# Patient Record
Sex: Female | Born: 1935
Health system: Southern US, Community
[De-identification: ages and names within clinical notes are randomized; demographics above are authoritative.]

## PROBLEM LIST (undated history)

## (undated) DIAGNOSIS — M810 Age-related osteoporosis without current pathological fracture: Secondary | ICD-10-CM

## (undated) DIAGNOSIS — F329 Major depressive disorder, single episode, unspecified: Secondary | ICD-10-CM

## (undated) DIAGNOSIS — E785 Hyperlipidemia, unspecified: Secondary | ICD-10-CM

## (undated) DIAGNOSIS — K4020 Bilateral inguinal hernia, without obstruction or gangrene, not specified as recurrent: Secondary | ICD-10-CM

## (undated) DIAGNOSIS — F32A Depression, unspecified: Secondary | ICD-10-CM

## (undated) DIAGNOSIS — I10 Essential (primary) hypertension: Secondary | ICD-10-CM

## (undated) DIAGNOSIS — E119 Type 2 diabetes mellitus without complications: Secondary | ICD-10-CM

## (undated) DIAGNOSIS — K635 Polyp of colon: Secondary | ICD-10-CM

## (undated) DIAGNOSIS — K5790 Diverticulosis of intestine, part unspecified, without perforation or abscess without bleeding: Secondary | ICD-10-CM

## (undated) DIAGNOSIS — Z90710 Acquired absence of both cervix and uterus: Secondary | ICD-10-CM

## (undated) DIAGNOSIS — M1990 Unspecified osteoarthritis, unspecified site: Secondary | ICD-10-CM

## (undated) DIAGNOSIS — D369 Benign neoplasm, unspecified site: Secondary | ICD-10-CM

## (undated) DIAGNOSIS — K219 Gastro-esophageal reflux disease without esophagitis: Secondary | ICD-10-CM

## (undated) HISTORY — DX: Acquired absence of both cervix and uterus: Z90.710

## (undated) HISTORY — DX: Polyp of colon: K63.5

## (undated) HISTORY — DX: Type 2 diabetes mellitus without complications: E11.9

## (undated) HISTORY — DX: Essential (primary) hypertension: I10

## (undated) HISTORY — DX: Hyperlipidemia, unspecified: E78.5

## (undated) HISTORY — PX: APPENDECTOMY: SHX54

## (undated) HISTORY — DX: Benign neoplasm, unspecified site: D36.9

## (undated) HISTORY — DX: Bilateral inguinal hernia, without obstruction or gangrene, not specified as recurrent: K40.20

## (undated) HISTORY — DX: Gastro-esophageal reflux disease without esophagitis: K21.9

## (undated) HISTORY — DX: Major depressive disorder, single episode, unspecified: F32.9

## (undated) HISTORY — DX: Unspecified osteoarthritis, unspecified site: M19.90

## (undated) HISTORY — DX: Depression, unspecified: F32.A

## (undated) HISTORY — DX: Age-related osteoporosis without current pathological fracture: M81.0

## (undated) HISTORY — DX: Diverticulosis of intestine, part unspecified, without perforation or abscess without bleeding: K57.90

## (undated) HISTORY — PX: LAPAROTOMY: SHX154

---

## 2001-06-27 DIAGNOSIS — Z90710 Acquired absence of both cervix and uterus: Secondary | ICD-10-CM

## 2001-06-27 HISTORY — DX: Acquired absence of both cervix and uterus: Z90.710

## 2001-08-30 ENCOUNTER — Other Ambulatory Visit: Admission: RE | Admit: 2001-08-30 | Discharge: 2001-08-30 | Payer: Self-pay | Admitting: *Deleted

## 2001-10-30 ENCOUNTER — Encounter: Admission: RE | Admit: 2001-10-30 | Discharge: 2001-10-30 | Payer: Self-pay | Admitting: *Deleted

## 2001-11-05 ENCOUNTER — Encounter (INDEPENDENT_AMBULATORY_CARE_PROVIDER_SITE_OTHER): Payer: Self-pay

## 2001-11-05 ENCOUNTER — Encounter (INDEPENDENT_AMBULATORY_CARE_PROVIDER_SITE_OTHER): Payer: Self-pay | Admitting: *Deleted

## 2001-11-05 ENCOUNTER — Inpatient Hospital Stay (HOSPITAL_COMMUNITY): Admission: RE | Admit: 2001-11-05 | Discharge: 2001-11-06 | Payer: Self-pay | Admitting: *Deleted

## 2005-09-13 ENCOUNTER — Encounter: Admission: RE | Admit: 2005-09-13 | Discharge: 2005-09-13 | Payer: Self-pay | Admitting: Endocrinology

## 2007-04-25 ENCOUNTER — Ambulatory Visit: Payer: Self-pay | Admitting: Internal Medicine

## 2007-05-03 ENCOUNTER — Ambulatory Visit: Payer: Self-pay | Admitting: Internal Medicine

## 2007-05-03 ENCOUNTER — Encounter: Payer: Self-pay | Admitting: Internal Medicine

## 2007-08-21 DIAGNOSIS — K219 Gastro-esophageal reflux disease without esophagitis: Secondary | ICD-10-CM | POA: Insufficient documentation

## 2007-08-21 DIAGNOSIS — E119 Type 2 diabetes mellitus without complications: Secondary | ICD-10-CM

## 2007-08-21 DIAGNOSIS — D126 Benign neoplasm of colon, unspecified: Secondary | ICD-10-CM

## 2007-08-21 DIAGNOSIS — M810 Age-related osteoporosis without current pathological fracture: Secondary | ICD-10-CM | POA: Insufficient documentation

## 2007-08-21 DIAGNOSIS — K5730 Diverticulosis of large intestine without perforation or abscess without bleeding: Secondary | ICD-10-CM | POA: Insufficient documentation

## 2007-08-21 DIAGNOSIS — F3289 Other specified depressive episodes: Secondary | ICD-10-CM | POA: Insufficient documentation

## 2007-08-21 DIAGNOSIS — F329 Major depressive disorder, single episode, unspecified: Secondary | ICD-10-CM

## 2007-08-21 DIAGNOSIS — M129 Arthropathy, unspecified: Secondary | ICD-10-CM | POA: Insufficient documentation

## 2007-08-21 DIAGNOSIS — E785 Hyperlipidemia, unspecified: Secondary | ICD-10-CM | POA: Insufficient documentation

## 2009-09-01 ENCOUNTER — Encounter: Payer: Self-pay | Admitting: Physician Assistant

## 2010-03-18 ENCOUNTER — Encounter: Payer: Self-pay | Admitting: Physician Assistant

## 2010-03-25 ENCOUNTER — Encounter: Payer: Self-pay | Admitting: Physician Assistant

## 2010-03-26 ENCOUNTER — Telehealth: Payer: Self-pay | Admitting: Internal Medicine

## 2010-03-30 ENCOUNTER — Ambulatory Visit: Payer: Self-pay | Admitting: Gastroenterology

## 2010-03-30 ENCOUNTER — Telehealth (INDEPENDENT_AMBULATORY_CARE_PROVIDER_SITE_OTHER): Payer: Self-pay | Admitting: *Deleted

## 2010-03-30 DIAGNOSIS — Z860100 Personal history of colon polyps, unspecified: Secondary | ICD-10-CM | POA: Insufficient documentation

## 2010-03-30 DIAGNOSIS — I10 Essential (primary) hypertension: Secondary | ICD-10-CM | POA: Insufficient documentation

## 2010-03-30 DIAGNOSIS — N159 Renal tubulo-interstitial disease, unspecified: Secondary | ICD-10-CM | POA: Insufficient documentation

## 2010-03-30 DIAGNOSIS — R197 Diarrhea, unspecified: Secondary | ICD-10-CM

## 2010-03-30 DIAGNOSIS — R634 Abnormal weight loss: Secondary | ICD-10-CM

## 2010-03-30 DIAGNOSIS — R109 Unspecified abdominal pain: Secondary | ICD-10-CM

## 2010-04-02 ENCOUNTER — Ambulatory Visit: Payer: Self-pay | Admitting: Cardiology

## 2010-04-20 ENCOUNTER — Ambulatory Visit: Payer: Self-pay | Admitting: Internal Medicine

## 2010-04-20 DIAGNOSIS — K4090 Unilateral inguinal hernia, without obstruction or gangrene, not specified as recurrent: Secondary | ICD-10-CM | POA: Insufficient documentation

## 2010-07-17 ENCOUNTER — Encounter: Payer: Self-pay | Admitting: Internal Medicine

## 2010-07-29 NOTE — Letter (Signed)
Summary: [facility]   Imported By: Sherian Rein 04/05/2010 12:03:01  _____________________________________________________________________  External Attachment:    Type:   Image     Comment:   External Document

## 2010-07-29 NOTE — Progress Notes (Signed)
Summary: Cancelled CT scan scheduled at [facility]   Phone Note Outgoing Call   Call placed by: Joselyn Glassman,  March 30, 2010 2:37 PM Call placed to: Specialist Summary of Call: The pt also had a CT scan scheduled at [facility] for Wed 03-31-10.  I called [facility] and cancelled the CT scan that Dr. Selena Batten scheduled. I let them know that the pt saw our PA today in our office and we scheduled one at [facility] CT for [facility] 04-02-10.  Initial call taken by: Joselyn Glassman,  March 30, 2010 2:39 PM

## 2010-07-29 NOTE — Letter (Signed)
Summary: [facility]   Imported By: Sherian Rein 04/05/2010 12:01:51  _____________________________________________________________________  External Attachment:    Type:   Image     Comment:   External Document

## 2010-07-29 NOTE — Assessment & Plan Note (Signed)
Summary: discuss inguinal hernias (see 04/02/10 ct scan)/ds    History of Present Illness Visit Type: Follow-up Visit Primary GI MD: Lina Sar MD Primary Denai Caba: Massie Maroon, MD  Requesting Chesnie Capell: na Chief Complaint: Discuss CT Scan. Pt states that she is feeling better and denies any GI complaints  History of Present Illness:   This is a 77 year old white female who is here for a followup of abdominal pain and weight loss since her appointment 3 weeks ago. A CT Scan of the abdomen completed on 10/7/11showed bilateral inguinal hernias; larger on the right side. It also showed calcification in the mesenteric artreries and a nondilated small bowel loop in the right inguinal hernia. Patient denies any abdominal pain. A colonoscopy in November 2008 showed a tubular adenoma. A prior colonoscopy in 1998 also showed a tubular adenoma. She has gained 2 pounds since her last visit. She attributes the weight loss to being on diabetic medications and on a restricted diet.   GI Review of Systems      Denies abdominal pain, acid reflux, belching, bloating, chest pain, dysphagia with liquids, dysphagia with solids, heartburn, loss of appetite, nausea, vomiting, vomiting blood, weight loss, and  weight gain.        Denies anal fissure, black tarry stools, change in bowel habit, constipation, diarrhea, diverticulosis, fecal incontinence, heme positive stool, hemorrhoids, irritable bowel syndrome, jaundice, light color stool, liver problems, rectal bleeding, and  rectal pain.    Current Medications (verified): 1)  Caltrate 600+d 600-400 Mg-Unit Tabs (Calcium Carbonate-Vitamin D) .... One Tablet By Mouth Once Daily 2)  Prozac 20 Mg Caps (Fluoxetine Hcl) .... One Tablet By Mouth Once Daily 3)  Actoplus Met 15-500 Mg Tabs (Pioglitazone Hcl-Metformin Hcl) .... One Tablet By Mouth Once Daily 4)  Accuretic 20-12.5 Mg Tabs (Quinapril-Hydrochlorothiazide) .... One Tablet By Mouth Once Daily 5)  Crestor 10 Mg  Tabs (Rosuvastatin Calcium) .... 1/2 Tablet  By Mouth  Once Daily  Allergies (verified): No Known Drug Allergies  Past History:  Past Medical History: HYPERTENSION (ICD-401.9) UNSPECIFIED INFECTION OF KIDNEY (ICD-590.9) COLONIC POLYPS, ADENOMATOUS (ICD-211.3) DIVERTICULOSIS, COLON (ICD-562.10) OSTEOPOROSIS (ICD-733.00) GERD (ICD-530.81) HYPERLIPIDEMIA (ICD-272.4) DEPRESSION (ICD-311) ARTHRITIS (ICD-716.90) DM (ICD-250.00) Diffuse colonic diverticula concentrated  within the rectosigmoid colon.  No diverticulitis  Small right inguinal hernia containing a nondilated loop of small bowel.  Past Surgical History: Reviewed history from 08/21/2007 and no changes required. hysterectomy in 2003 appendectomy laparotomy  Family History: Reviewed history from 04/15/2010 and no changes required. Family History of Colon Cancer:Sister  Family History of Colon Polyps: Sister Family History of Heart Disease: Brother  Social History: Reviewed history from 03/30/2010 and no changes required. Retired Married Patient has never smoked.  Alcohol Use - yes: wine occ Daily Caffeine Use: coffee in the morning  Illicit Drug Use - no  Review of Systems       Pertinent positive and negative review of systems were noted in the above HPI. All other ROS was otherwise negative.   Vital Signs:  Patient profile:   77 year old female Height:      62 inches Weight:      120 pounds BMI:     22.03 BSA:     1.54 Pulse rate:   88 / minute Pulse rhythm:   regular BP sitting:   132 / 64  (left arm) Cuff size:   regular  Vitals Entered By: [name] CMA (April 20, 2010 2:20 PM)  Physical Exam  General:  Well developed, well  nourished, no acute distress. Mouth:  No deformity or lesions, dentition normal. Lungs:  Clear throughout to auscultation. Heart:  Regular rate and rhythm; no murmurs, rubs,  or bruits. Abdomen:  soft abdomen which is nontender. In standing position there is a right  inguinal hernia easily palpated in the right suprapubic area and easily reducible by persistent pressure. In lying down position the hernia reduces itself spontaneously. As she stands up the hernia area appears. On the left side there is no discernible hernia. Extremities:  No clubbing, cyanosis, edema or deformities noted. Skin:  Intact without significant lesions or rashes. Psych:  Alert and cooperative. Normal mood and affect.   Impression & Recommendations:  Problem # 1:  WEIGHT LOSS-ABNORMAL (ICD-783.21) Patient has gained 2 pounds since her last visit. There are no significant findings on a CT scan of the abdomen. Her weight loss is most likely related to dietary restrictions.  Problem # 2:  PERSONAL HX COLONIC POLYPS (ICD-V12.72) She is up-to-date on her colonoscopy.  Problem # 3:  DIVERTICULOSIS, COLON (ICD-562.10) Patient had diverticulosis on a colonoscopy and CT scan. She is not symptomatic at this time.  Problem # 4:  INGUINAL HERNIA (ICD-550.90) Patient has a right-sided inguinal hernia without symptoms. This was found incidentally on a CT scan. It is easily reducible. I would not recommend surgical intervention at this time.  Patient Instructions: 1)  Follow up as neededc 2)  consider surgical consultation if right inguinal hernia becomes more symptomatic 3)  Copy sent to : Pearson Grippe, MD 4)  The medication list was reviewed and reconciled.  All changed / newly prescribed medications were explained.  A complete medication list was provided to the patient / caregiver.

## 2010-07-29 NOTE — Progress Notes (Signed)
Summary: Triage   Phone Note From Other Clinic   Caller: Pat @ Dr. Selena Batten [phone] Close at [facility] today Call For: Dr. Juanda Chance Summary of Call: weight loss and weakness...requsting pt. be seen. No appt. avail at this time Initial call taken by: Karna Christmas,  March 26, 2010 10:40 AM  Follow-up for Phone Call        Pt. will see Willette Cluster NP on 03-30-10 at 1:30pm. Dennie Bible will advise pt. of aqppt/med.list/co-pay. Dennie Bible will fax records to [facility]. Follow-up by: Laureen Ochs LPN,  March 26, 2010 10:48 AM

## 2010-07-29 NOTE — Assessment & Plan Note (Signed)
Summary: WEIGHT LOSS/WEAKNESS        (DR.BRODIE PT.)      Brandi Mccoy    History of Present Illness Visit Type: consult  Primary GI MD: Lina Sar MD Primary Provider: Massie Maroon, MD  Requesting Provider: Massie Maroon, MD  Chief Complaint: weight loss, weakness, lower abd pain, and diarrhea  History of Present Illness:   Brandi Mccoy 77 YO FEMALE KNOWN TO DR. Juanda Chance FROM PRIIOR COLONOSCOPY WHICH WAS DONE IN 2008. SHE HAS DIVERTICULOSIS RIGHT AND LEFT COLON, AND HAD AN ADENOMATOUS POLYP.  SHE IS REFERRED TODAY PER DR. Idelle Leech PRIMARY M.D. WITH C/O 6 MONTHS OF INTERMITTENT LOWER ABDOMINAL PAIN ACROSS HER ABDOMEN. SHE DESCRIBES THIS AS A DULL PRESSURE TYPE PAIN. NO ASSOCIATED BLOATING. SHE HAS A BM DAILY BUT IS ALSO HAVING SPELLS OF DIARRHEA WHICH ARE ASSOCIATED WITH THE PAIN. NO MELENA OR HEME. NO FEVERS OR SWEATS. HER APPETITE HAS BEEN POOR,"FOOD DOES NOT APPEAL",AND SHE HAS LOST ABOUT 9 POUNDS..NO NAUSEA. SHE IS ON CIPRO CURRENTLY FOR A UTI. LABS ON 03/17/10  WBC 5.3,HGB11.2,MCV 89..0,LFTS WNL,HGB A-1C 6.3.   GI Review of Systems    Reports abdominal pain and  weight loss.     Location of  Abdominal pain: lower abdomen. Weight loss of 10 pounds over year.   Denies acid reflux, belching, bloating, chest pain, dysphagia with liquids, dysphagia with solids, heartburn, loss of appetite, nausea, vomiting, vomiting blood, and  weight gain.      Reports diarrhea.     Denies anal fissure, black tarry stools, change in bowel habit, constipation, diverticulosis, fecal incontinence, heme positive stool, hemorrhoids, irritable bowel syndrome, jaundice, light color stool, liver problems, rectal bleeding, and  rectal pain.    Current Medications (verified): 1)  Cipro 500 Mg Tabs (Ciprofloxacin Hcl) .... One By Mouth Two Times A Day For Kidney Infection 2)  Caltrate 600+d 600-400 Mg-Unit Tabs (Calcium Carbonate-Vitamin D) .... One Tablet By Mouth Once Daily 3)  Prozac 20 Mg Caps (Fluoxetine Hcl) .... One  Tablet By Mouth Once Daily 4)  Actoplus Met 15-500 Mg Tabs (Pioglitazone Hcl-Metformin Hcl) .... One Tablet By Mouth Once Daily 5)  Accuretic 20-12.5 Mg Tabs (Quinapril-Hydrochlorothiazide) .... One Tablet By Mouth Once Daily 6)  Crestor 10 Mg Tabs (Rosuvastatin Calcium) .... 1/2 Tablet  By Mouth  Once Daily  Allergies (verified): No Known Drug Allergies  Past History:  Past Medical History: HYPERTENSION (ICD-401.9) UNSPECIFIED INFECTION OF KIDNEY (ICD-590.9)  COLONIC POLYPS, ADENOMATOUS (ICD-211.3) DIVERTICULOSIS, COLON (ICD-562.10) OSTEOPOROSIS (ICD-733.00) GERD (ICD-530.81) HYPERLIPIDEMIA (ICD-272.4) DEPRESSION (ICD-311) ARTHRITIS (ICD-716.90) DM (ICD-250.00)  Past Surgical History: Reviewed history from 08/21/2007 and no changes required. hysterectomy in 2003 appendectomy laparotomy  Family History: Family History of Colon Cancer:Sister   Social History: Retired Married Patient has never smoked.  Alcohol Use - yes: wine occ Daily Caffeine Use: coffee in the morning  Illicit Drug Use - no Smoking Status:  never Drug Use:  no  Review of Systems       The patient complains of fatigue, urination - excessive, and urination changes/pain.  The patient denies allergy/sinus, anemia, anxiety-new, arthritis/joint pain, back pain, blood in urine, breast changes/lumps, change in vision, confusion, cough, coughing up blood, depression-new, fainting, fever, headaches-new, hearing problems, heart murmur, heart rhythm changes, itching, menstrual pain, muscle pains/cramps, night sweats, nosebleeds, pregnancy symptoms, shortness of breath, skin rash, sleeping problems, sore throat, swelling of feet/legs, swollen lymph glands, thirst - excessive , urination - excessive , urine leakage, vision changes, and voice change.  SEE HPI  Vital Signs:  Patient profile:   77 year old female Height:      62 inches Weight:      120 pounds BMI:     22.03 BSA:     1.54 Pulse rate:   80  / minute Pulse rhythm:   regular BP sitting:   142 / 68  (left arm) Cuff size:   regular  Vitals Entered By: [name] CMA (March 30, 2010 1:30 PM)  Physical Exam  General:  Well developed, well nourished, no acute distress,ELDERLY. Head:  Normocephalic and atraumatic. Eyes:  PERRLA, no icterus. Lungs:  Clear throughout to auscultation. Heart:  heart murmur systolic:.   Abdomen:  SOFT, MILD FULLNESS IN LLQ,MINIMAL TENDERNESS, NO MASS OR HSM, BS+,NO BRUIT Rectal:  HEME NEGATIVE  STOOL Extremities:  No clubbing, cyanosis, edema or deformities noted. Neurologic:  Alert and  oriented x4;  grossly normal neurologically. Psych:  Alert and cooperative. Normal mood and affect.   Impression & Recommendations:  Problem # 1:  ABDOMINAL PAIN, LOWER (ICD-789.09) Assessment New 77 YO FEMALE WITH 6 MONTH HX OF LOWER ABDOMINAL PAIN INTERMITTENT,INTERMITTENT DIARRHEA,ANOREXIA AND WEIGHT LOSS. PT WITH DIVERTICULOSIS,AND HX OF ADENOMATOUS POLYP 2008. ETIOLOGY OF CURRENT SXS IS NOT CLEAR,R/O SYMPTOMATIC DIVERTICULAR DISEASE,R/O OCCULT MALIGNANCY,R/O MESENTERIC INSUFFICIENCY.   SCHEDULE FOR CT SCAN ABDOMEN AND PELVIS. FURTHER PLANS AND FOLLOW UP PENDING FINDINGS ON CT. Orders: CT Abdomen/Pelvis with Contrast (CT Abd/Pelvis w/con)  Problem # 2:  HYPERTENSION (ICD-401.9) Assessment: Comment Only  Problem # 3:  GERD (ICD-530.81) Assessment: Comment Only  Problem # 4:  HYPERLIPIDEMIA (ICD-272.4) Assessment: Comment Only  Problem # 5:  DM (ICD-250.00) Assessment: Comment Only ON ORAL AGENT  Patient Instructions: 1)  We have scheduled the CT scan at [facility], [facility] on [address]. 2)  Directions and contrast given. 3)  D0 not take the Actoplus Met oral diabetic medication the morning of the CT scan. 4)  Copy sent to :  Dr. Lytle Michaels. Kim 5)  The medication list was reviewed and reconciled.  All changed / newly prescribed medications were explained.  A complete  medication list was provided to the patient / caregiver.

## 2010-07-29 NOTE — Op Note (Signed)
Summary: Hysterectomy, Anterior and Posterior Repair                         [facility]  Patient:    Brandi Mccoy, Brandi Mccoy Visit Number: [id number] MRN: [id number]          Service Type: GYN Location: 1S X006 01 Attending Physician:  Marin Comment Dictated by:   Pershing Cox, M.D. Proc. Date: 11/05/01 Admit Date:  11/05/2001   CC:         Brandi Mccoy, M.D.  Brandi Mccoy. Brandi Mccoy., M.D.   Operative Report  PREOPERATIVE DIAGNOSES: 1. Third degree cystocele. 2. Uterine descensus. 3. First degree rectocele.  POSTOPERATIVE DIAGNOSES: 1. Third degree cystocele. 2. Uterine descensus. 3. First degree rectocele.  PROCEDURE: 1. Examination under anesthesia. 2. Vaginal hysterectomy. 3. Anterior and posterior repair.  ANESTHESIA:  General endotracheal.  SURGEON:  Pershing Cox, M.D.  ASSISTANT:  Brandi Mccoy, M.D.  INDICATIONS FOR PROCEDURE:  This patient is 77 years old. She has had worsening bladder symptoms over the last three years. Actually, three years ago hysterectomy and repair were recommended. She was hoping that the problem would resolve on its own but when she began to work in her garden, she was unable to continue working because of her bladder pressure. She has never been on hormone replacement therapy. She is not sexually active. She occasionally has urge incontinence when getting up in the middle of the night.  OPERATIVE FINDINGS:  The uterus was small, approximately 6 cm in size. The ovaries were not seen but were palpated on the sidewalls as small and normal. the patient had a third degree cystocele. She had a first degree rectocele which was high and did not involve the distal vagina; therefore, a perineorrhaphy was unnecessary.  DESCRIPTION OF PROCEDURE:  Brandi Mccoy was brought to the operating room with an IV in place. She had thigh high PAS stockings on her lower extremities. She had received a gram of Ancef in  the holding area. Supine on the OR table, general endotracheal anesthesia was administered. She was then placed into Allen stirrups and examination under anesthesia was performed. See above. She was then prepped with a solution of Hibiclens and a red rubber catheter was used to empty the bladder. The patient was draped for a sterile vaginal procedure. Weighted vaginal speculum was placed into the vagina. A narrow Deaver was used to expose the cervix and the cervix was grasped with Christella Hartigan tenaculum. Dilute vasopressin,  10 in 100 cc, was injected circumferentially around the cervix raising the perivaginal tissues. A knife was used to incise the perivaginal tissues, which were then separated from the cervix itself by sharp dissection with Mayo scissors. I entered the cul-de-sac of Douglas easily through the posterior vaginal wall. The uterosacral ligaments were clamped and suture ligated and then held for later in the case. The long weighted speculum was then placed to protect the bowel through the rest of the procedure. The bladder was separated from the anterior cervix by placing a finger in the vaginal fornix and then doing sharp dissection down to this spot. We were able to detect a plane and incised so that the bladder could be carefully lifted. A series of Heaney clamps were then used to clamp the perivaginal tissues. The first clamp was along the cervix and was Heaney ligated. Both uterine arteries were taken individually and suture ligated. Once the uterine arteries had been taken,  we were able to get around the adnexal structures with a single clamp. The specimen was excised. The utero-ovarian pedicle was suture and free tie ligated. Careful inspection of the vaginal sidewall showed no bleeding. There was active bleeding from the posterior vaginal wall and this was run with a 2-0 Vicryl stitch. After inspecting the vaginal surfaces, a McCalls stitch was placed raising the posterior  peritoneum and closing the cul-de-sac of Douglas. Vaginal walls were closed side to side with interrupted Vicryl suture using 2-0 and a figure-of-eight suture. Prior to closing the vagina, the midline of the vagina was marked. Using this mark, we then proceeded with a cystocele repair.  Vasopressin was injected in the midline at the anterior vagina. Metzenbaum scissors were used to undermine separating the bladder from the vaginal mucosa. This was incised and the vaginal mucosa was taken down off of the bladder laterally until the urethra was approached. Foley catheter was inserted into the urethra. Dissecting around the lateral margins of the urethra, this space was cleared for a Kelly plication stitch. A 0 Vicryl on a UR-6 was used to place a single Kelly plication stitch and this was tied securely but not too tight to support the urethra in the sling. The perivaginal tissues were brought together over the freely dissected bladder making a shelf for the bladder itself. Vaginal edges were trimmed and redundant mucosa was discarded. The anterior vaginal wall was closed with a 2-0 Vicryl suture using a running stitch.  With a finger in the rectum, the rectocele was shown to be quite high and not extending to the perineum. For this reason, _______ rectocele repair was done. Vasopressin was injected in the midline over the length of the designated rectocele repair. Allis clamps had been previously placed while the finger was in the rectum to designate the most appropriate spots. With the vasopressin in place, a scalpel was used to incise the midline. Rectum was then dissected off the mucosa. Stitches were used to plicate over the top of the rectum and after two layers of this had been created, the rectocele was fully reduced. The vaginal mucosa was closed with interrupted 2-0 Vicryl sutures. A 2-inch gauze with Estrace was placed into the vagina as packing. A perineorrhaphy was not  performed.   This patient tolerated the procedure well. Her estimated urine output was 100 cc. Estimated blood loss was approximately 100 cc. The patient had no intraoperative complications.  SPECIMENS:  Uterus. Dictated by:   Pershing Cox, M.D. Attending Physician:  Marin Comment DD:  11/05/01 TD:  11/05/01 Job: [id number]

## 2010-11-09 NOTE — Assessment & Plan Note (Signed)
[facility]                         GASTROENTEROLOGY OFFICE NOTE   NAME:HAILEYElaine, Roanhorse                       MRN:          [id number]  DATE:04/25/2007                            DOB:          Nov 25, 1921    GI CONSULTATION   Ms. Bonnell is a very nice 77 year old white female.  She is a former  patient of Dr. Victorino Dike and former patient of Dr. Higinio Plan.  She  is followed now by Dr. Selena Batten and she is referred for a colonoscopy.  Ms.  Lahaie had 3 prior colonoscopies.  We are currently in the process of  recovering her old chart from our warehouse.  She had last colonoscopy  done within 5 years ago and received a recall letter within last year or  two to have a repeat colonoscopy because she had colon polyps.  Also,  her sister had colon polyps.  Her last colonoscopy was done here in our  office.  Ms. Diggins symptoms are that of urgent diarrhea, usually  postprandially.  These attacks occur about once a month and in between  her bowel habits are regular.  She denies any constipation.  She can  avoid the attacks sometimes by reducing her amount of food and also  avoiding certain starch and rich foods.   MEDICATIONS:  1. Accuretic 20/12.5 mg p.o. daily.  2. Actoplus 15/500 one p.o. daily.  3. Prozac 20 mg p.o. daily.  4. Crestor 0.5 mg daily.  5. Fosamax once a week.  6. Multiple vitamins with zinc and iron.   PAST HISTORY:  Significant for:  1. Diabetes.  2. Arthritis.  3. Depression.  4. Hysterectomy.  5. Appendectomy.  6. She had actually a ruptured perforated appendix about 40 years ago      requiring emergency laparotomy.   FAMILY HISTORY:  1. Positive for colon polyps in her sister.  2. A brother had heart disease.   SOCIAL HISTORY:  1. Married.  2. Having two children.  3. She does not smoke, does not drink alcohol.   REVIEW OF SYSTEMS:  Positive for weight loss of about 10 pounds.  She  had some muscle pains.   PHYSICAL  EXAM:  Blood pressure 124/72, pulse 74 and weight 129 pounds.  She was alert, oriented, appearing younger than her stated age of 34.  Her skin was warm and dry.  LUNGS:  Clear to auscultation.  COR:  With normal S1, normal S2.  ABDOMEN:  Soft with normoactive bowel sounds, no distention, liver edge  at costal margin.  RECTAL EXAM:  Normal rectal tone.  Stool was soft, dark, Hemoccult  negative.   IMPRESSION:  1. An 77 year old white female with history of colon polyps on prior      colonoscopies, the records are pending.  She has received a recall      letter for colonoscopy.  She is a good candidate for repeat      colonoscopy.  2. History of hyperlipidemia.  3. Diabetes mellitus with neuropathy.  4. High blood pressure.  5. Gastroesophageal reflux.  6. Left bundle branch block.  7. Osteoporosis.  8. Status post vaginal hysterectomy and A&P repair in 2003.   PLAN:  Colonoscopy discussed with the patient.  We will schedule after  we review the office records from previous colonoscopies.     Hedwig Morton. Juanda Chance, MD  Electronically Signed    DMB/MedQ  DD: 04/25/2007  DT: 04/26/2007  Job #: [id number]   cc:   Massie Maroon, MD

## 2010-11-12 NOTE — Op Note (Signed)
Brandi Mccoy  Patient:    Brandi, Mccoy Visit Number: [id number] MRN: [id number]          Service Type: GYN Location: 1S X006 01 Attending Physician:  Marin Comment Dictated by:   Pershing Cox, M.D. Proc. Date: 11/05/01 Admit Date:  11/05/2001   CC:         Brandi Mccoy, M.D.  Brandi Mccoy. Brandi Mccoy., M.D.   Operative Report  PREOPERATIVE DIAGNOSES: 1. Third degree cystocele. 2. Uterine descensus. 3. First degree rectocele.  POSTOPERATIVE DIAGNOSES: 1. Third degree cystocele. 2. Uterine descensus. 3. First degree rectocele.  PROCEDURE: 1. Examination under anesthesia. 2. Vaginal hysterectomy. 3. Anterior and posterior repair.  ANESTHESIA:  General endotracheal.  SURGEON:  Pershing Cox, M.D.  ASSISTANT:  Brandi Mccoy, M.D.  INDICATIONS FOR PROCEDURE:  This patient is 77 years old. She has had worsening bladder symptoms over the last three years. Actually, three years ago hysterectomy and repair were recommended. She was hoping that the problem would resolve on its own but when she began to work in her garden, she was unable to continue working because of her bladder pressure. She has never been on hormone replacement therapy. She is not sexually active. She occasionally has urge incontinence when getting up in the middle of the night.  OPERATIVE FINDINGS:  The uterus was small, approximately 6 cm in size. The ovaries were not seen but were palpated on the sidewalls as small and normal. the patient had a third degree cystocele. She had a first degree rectocele which was high and did not involve the distal vagina; therefore, a perineorrhaphy was unnecessary.  DESCRIPTION OF PROCEDURE:  Brandi Mccoy was brought to the operating room with an IV in place. She had thigh high PAS stockings on her lower extremities. She had received a gram of Ancef in the holding area. Supine on the OR table, general  endotracheal anesthesia was administered. She was then placed into Allen stirrups and examination under anesthesia was performed. See above. She was then prepped with a solution of Hibiclens and a red rubber catheter was used to empty the bladder. The patient was draped for a sterile vaginal procedure. Weighted vaginal speculum was placed into the vagina. A narrow Deaver was used to expose the cervix and the cervix was grasped with Brandi Mccoy tenaculum. Dilute vasopressin,  10 in 100 cc, was injected circumferentially around the cervix raising the perivaginal tissues. A knife was used to incise the perivaginal tissues, which were then separated from the cervix itself by sharp dissection with Mayo scissors. I entered the cul-de-sac of Douglas easily through the posterior vaginal wall. The uterosacral ligaments were clamped and suture ligated and then held for later in the case. The long weighted speculum was then placed to protect the bowel through the rest of the procedure. The bladder was separated from the anterior cervix by placing a finger in the vaginal fornix and then doing sharp dissection down to this spot. We were able to detect a plane and incised so that the bladder could be carefully lifted. A series of Heaney clamps were then used to clamp the perivaginal tissues. The first clamp was along the cervix and was Heaney ligated. Both uterine arteries were taken individually and suture ligated. Once the uterine arteries had been taken, we were able to get around the adnexal structures with a single clamp. The specimen was excised. The utero-ovarian pedicle was suture and free tie ligated. Careful inspection of the  vaginal sidewall showed no bleeding. There was active bleeding from the posterior vaginal wall and this was run with a 2-0 Vicryl stitch. After inspecting the vaginal surfaces, a McCalls stitch was placed raising the posterior peritoneum and closing the cul-de-sac of Douglas.  Vaginal walls were closed side to side with interrupted Vicryl suture using 2-0 and a figure-of-eight suture. Prior to closing the vagina, the midline of the vagina was marked. Using this mark, we then proceeded with a cystocele repair.  Vasopressin was injected in the midline at the anterior vagina. Metzenbaum scissors were used to undermine separating the bladder from the vaginal mucosa. This was incised and the vaginal mucosa was taken down off of the bladder laterally until the urethra was approached. Foley catheter was inserted into the urethra. Dissecting around the lateral margins of the urethra, this space was cleared for a Kelly plication stitch. A 0 Vicryl on a UR-6 was used to place a single Kelly plication stitch and this was tied securely but not too tight to support the urethra in the sling. The perivaginal tissues were brought together over the freely dissected bladder making a shelf for the bladder itself. Vaginal edges were trimmed and redundant mucosa was discarded. The anterior vaginal wall was closed with a 2-0 Vicryl suture using a running stitch.  With a finger in the rectum, the rectocele was shown to be quite high and not extending to the perineum. For this reason, _______ rectocele repair was done. Vasopressin was injected in the midline over the length of the designated rectocele repair. Allis clamps had been previously placed while the finger was in the rectum to designate the most appropriate spots. With the vasopressin in place, a scalpel was used to incise the midline. Rectum was then dissected off the mucosa. Stitches were used to plicate over the top of the rectum and after two layers of this had been created, the rectocele was fully reduced. The vaginal mucosa was closed with interrupted 2-0 Vicryl sutures. A 2-inch gauze with Estrace was placed into the vagina as packing. A perineorrhaphy was not performed.   This patient tolerated the procedure well.  Her estimated urine output was 100 cc. Estimated blood loss was approximately 100 cc. The patient had no intraoperative complications.  SPECIMENS:  Uterus. Dictated by:   Pershing Cox, M.D. Attending Physician:  Marin Comment DD:  11/05/01 TD:  11/05/01 Job: [id number]

## 2011-07-01 DIAGNOSIS — M1710 Unilateral primary osteoarthritis, unspecified knee: Secondary | ICD-10-CM | POA: Diagnosis not present

## 2011-07-01 DIAGNOSIS — M25569 Pain in unspecified knee: Secondary | ICD-10-CM | POA: Diagnosis not present

## 2011-09-28 DIAGNOSIS — D3130 Benign neoplasm of unspecified choroid: Secondary | ICD-10-CM | POA: Diagnosis not present

## 2011-09-28 DIAGNOSIS — E119 Type 2 diabetes mellitus without complications: Secondary | ICD-10-CM | POA: Diagnosis not present

## 2011-09-28 DIAGNOSIS — H52229 Regular astigmatism, unspecified eye: Secondary | ICD-10-CM | POA: Diagnosis not present

## 2011-09-28 DIAGNOSIS — H5200 Hypermetropia, unspecified eye: Secondary | ICD-10-CM | POA: Diagnosis not present

## 2011-11-16 DIAGNOSIS — L84 Corns and callosities: Secondary | ICD-10-CM | POA: Diagnosis not present

## 2012-01-12 DIAGNOSIS — D649 Anemia, unspecified: Secondary | ICD-10-CM | POA: Diagnosis not present

## 2012-01-12 DIAGNOSIS — E7800 Pure hypercholesterolemia, unspecified: Secondary | ICD-10-CM | POA: Diagnosis not present

## 2012-01-12 DIAGNOSIS — E119 Type 2 diabetes mellitus without complications: Secondary | ICD-10-CM | POA: Diagnosis not present

## 2012-01-17 DIAGNOSIS — M542 Cervicalgia: Secondary | ICD-10-CM | POA: Diagnosis not present

## 2012-01-17 DIAGNOSIS — M47812 Spondylosis without myelopathy or radiculopathy, cervical region: Secondary | ICD-10-CM | POA: Diagnosis not present

## 2012-01-17 DIAGNOSIS — W19XXXA Unspecified fall, initial encounter: Secondary | ICD-10-CM | POA: Diagnosis not present

## 2012-05-15 DIAGNOSIS — Z23 Encounter for immunization: Secondary | ICD-10-CM | POA: Diagnosis not present

## 2012-11-29 DIAGNOSIS — H47039 Optic nerve hypoplasia, unspecified eye: Secondary | ICD-10-CM | POA: Diagnosis not present

## 2012-11-29 DIAGNOSIS — H4720 Unspecified optic atrophy: Secondary | ICD-10-CM | POA: Diagnosis not present

## 2012-11-29 DIAGNOSIS — E119 Type 2 diabetes mellitus without complications: Secondary | ICD-10-CM | POA: Diagnosis not present

## 2012-12-19 DIAGNOSIS — H4720 Unspecified optic atrophy: Secondary | ICD-10-CM | POA: Diagnosis not present

## 2012-12-26 DIAGNOSIS — M810 Age-related osteoporosis without current pathological fracture: Secondary | ICD-10-CM | POA: Diagnosis not present

## 2012-12-26 DIAGNOSIS — E7800 Pure hypercholesterolemia, unspecified: Secondary | ICD-10-CM | POA: Diagnosis not present

## 2012-12-26 DIAGNOSIS — I10 Essential (primary) hypertension: Secondary | ICD-10-CM | POA: Diagnosis not present

## 2012-12-26 DIAGNOSIS — E119 Type 2 diabetes mellitus without complications: Secondary | ICD-10-CM | POA: Diagnosis not present

## 2013-01-01 DIAGNOSIS — E119 Type 2 diabetes mellitus without complications: Secondary | ICD-10-CM | POA: Diagnosis not present

## 2013-01-01 DIAGNOSIS — I10 Essential (primary) hypertension: Secondary | ICD-10-CM | POA: Diagnosis not present

## 2013-01-01 DIAGNOSIS — D649 Anemia, unspecified: Secondary | ICD-10-CM | POA: Diagnosis not present

## 2013-01-01 DIAGNOSIS — E7800 Pure hypercholesterolemia, unspecified: Secondary | ICD-10-CM | POA: Diagnosis not present

## 2013-02-19 ENCOUNTER — Other Ambulatory Visit: Payer: Self-pay | Admitting: Internal Medicine

## 2013-02-19 DIAGNOSIS — N289 Disorder of kidney and ureter, unspecified: Secondary | ICD-10-CM

## 2013-02-27 ENCOUNTER — Ambulatory Visit
Admission: RE | Admit: 2013-02-27 | Discharge: 2013-02-27 | Disposition: A | Discharge status: Home / Self Care | Payer: Medicare Other | Source: Ambulatory Visit | Attending: Internal Medicine | Admitting: Internal Medicine

## 2013-02-27 DIAGNOSIS — N289 Disorder of kidney and ureter, unspecified: Secondary | ICD-10-CM

## 2013-04-22 DIAGNOSIS — Z23 Encounter for immunization: Secondary | ICD-10-CM | POA: Diagnosis not present

## 2013-04-24 ENCOUNTER — Telehealth: Payer: Self-pay | Admitting: Cardiology

## 2013-04-24 NOTE — Telephone Encounter (Signed)
ROI faxed to [facility]/Dr.James Selena Batten at [phone]  04/24/13/Km

## 2013-05-17 ENCOUNTER — Telehealth: Payer: Self-pay | Admitting: Cardiology

## 2013-05-17 NOTE — Telephone Encounter (Signed)
REcords rec From [facility], gave to Scheduling Dept 05/17/13/KM

## 2013-06-06 ENCOUNTER — Ambulatory Visit: Payer: Medicare Other | Admitting: Cardiology

## 2013-08-21 ENCOUNTER — Encounter: Payer: Self-pay | Admitting: Cardiology

## 2013-08-21 DIAGNOSIS — I10 Essential (primary) hypertension: Secondary | ICD-10-CM | POA: Diagnosis not present

## 2013-08-21 DIAGNOSIS — E7800 Pure hypercholesterolemia, unspecified: Secondary | ICD-10-CM | POA: Diagnosis not present

## 2013-08-21 DIAGNOSIS — E119 Type 2 diabetes mellitus without complications: Secondary | ICD-10-CM | POA: Diagnosis not present

## 2013-08-28 DIAGNOSIS — I10 Essential (primary) hypertension: Secondary | ICD-10-CM | POA: Diagnosis not present

## 2013-08-28 DIAGNOSIS — E119 Type 2 diabetes mellitus without complications: Secondary | ICD-10-CM | POA: Diagnosis not present

## 2013-08-28 DIAGNOSIS — E7800 Pure hypercholesterolemia, unspecified: Secondary | ICD-10-CM | POA: Diagnosis not present

## 2013-10-10 ENCOUNTER — Encounter: Payer: Self-pay | Admitting: Cardiology

## 2013-10-10 ENCOUNTER — Ambulatory Visit (INDEPENDENT_AMBULATORY_CARE_PROVIDER_SITE_OTHER): Payer: Medicare Other | Admitting: Cardiology

## 2013-10-10 VITALS — BP 150/60 | HR 73 | Ht 62.0 in | Wt 122.0 lb

## 2013-10-10 DIAGNOSIS — E785 Hyperlipidemia, unspecified: Secondary | ICD-10-CM

## 2013-10-10 DIAGNOSIS — E119 Type 2 diabetes mellitus without complications: Secondary | ICD-10-CM

## 2013-10-10 DIAGNOSIS — R002 Palpitations: Secondary | ICD-10-CM

## 2013-10-10 DIAGNOSIS — R0600 Dyspnea, unspecified: Secondary | ICD-10-CM | POA: Insufficient documentation

## 2013-10-10 DIAGNOSIS — I10 Essential (primary) hypertension: Secondary | ICD-10-CM

## 2013-10-10 DIAGNOSIS — R0989 Other specified symptoms and signs involving the circulatory and respiratory systems: Secondary | ICD-10-CM

## 2013-10-10 DIAGNOSIS — R0609 Other forms of dyspnea: Secondary | ICD-10-CM | POA: Diagnosis not present

## 2013-10-10 NOTE — Patient Instructions (Signed)
Reduce your caffeine intake.  We will get your lab work from Dr. Maudie Mercury  We will have you wear a 24 hour monitor.  We will schedule you for an Echocardiogram

## 2013-10-10 NOTE — Progress Notes (Signed)
   Brandi Mccoy Date of Birth: Mar 04, 1922 Medical Record #[id number]  History of Present Illness: Brandi Mccoy is seen at the request of Dr. Maudie Mercury for evaluation of dyspnea and palpitations. She is a pleasant 80 yo WF with history of HTN, DM, and hyperlipidemia. No prior cardiac history. She reports that over the last 6 months she has noticed a sensation of her heart skipping with rare symptom of racing. She does complain of more SOB when she does her yard work or gardening. Still very active. No chest pain. No dizziness or syncope. Feels anxious.  No current outpatient prescriptions on file prior to visit.   No current facility-administered medications on file prior to visit.    No Known Allergies  Past Medical History  Diagnosis Date  . Bilateral inguinal hernia   . Tubular adenoma     Shown on colonoscopy November 2008 and prior colonoscopy in 1998  . Hypertension   . Colonic polyp     Adenomatous  . Diverticulosis     Colon  . Osteoporosis   . GERD (gastroesophageal reflux disease)   . Hyperlipidemia   . Depression   . Arthritis   . H/O: hysterectomy 2003  . Diabetes mellitus type 2 in nonobese     Past Surgical History  Procedure Laterality Date  . Appendectomy    . Laparotomy      History  Smoking status  . Never Smoker   Smokeless tobacco  . Not on file    History  Alcohol Use  . Yes    Family History  Problem Relation Age of Onset  . Heart disease Mother     Review of Systems: As noted in HPI.  All other systems were reviewed and are negative.  Physical Exam: BP 150/60  Pulse 73  Ht 5\' 2"  (1.575 m)  Wt 122 lb (55.339 kg)  BMI 22.31 kg/m2 Pleasant WF who appears younger than her stated age.  HEENT: [place]/AT, PERRL, EOMI, sclera are clear. Oropharynx is clear. Neck: no JVD, adenopathy, thyromegaly, or bruits. Lungs; clear CV: RRR normal S1-2. Soft systolic murmur 1/6 at LSB. No gallop Abd: soft, NT. BS+, no masses or HSM Ext: no edema or  cyanosis. Pulses 2+ Neuro: alert and oriented x 3.  Skin warm and dry  LABORATORY DATA: Ecg: NSR with PACs, LBBB  Assessment / Plan: 1. Dyspnea. Etiology unclear. I have requested a copy of her most recent lab work from Dr. Maudie Mercury. Will schedule for an Echocardiogram  2. Palpitations: noted to have PACs on Ecg. Recommend avoidance of caffeine. Will get 24 hr Holter monitor to evaluate rhythm further.  3. LBBB. No old Ecgs for comparison.  4. HTN controlled.  5. DM type 2.

## 2013-10-21 ENCOUNTER — Ambulatory Visit (HOSPITAL_COMMUNITY): Payer: Medicare Other | Attending: Cardiology | Admitting: Radiology

## 2013-10-21 ENCOUNTER — Encounter: Payer: Self-pay | Admitting: *Deleted

## 2013-10-21 ENCOUNTER — Encounter (HOSPITAL_BASED_OUTPATIENT_CLINIC_OR_DEPARTMENT_OTHER): Payer: Self-pay

## 2013-10-21 DIAGNOSIS — E119 Type 2 diabetes mellitus without complications: Secondary | ICD-10-CM

## 2013-10-21 DIAGNOSIS — R002 Palpitations: Secondary | ICD-10-CM | POA: Diagnosis not present

## 2013-10-21 DIAGNOSIS — R0609 Other forms of dyspnea: Secondary | ICD-10-CM | POA: Insufficient documentation

## 2013-10-21 DIAGNOSIS — R0989 Other specified symptoms and signs involving the circulatory and respiratory systems: Principal | ICD-10-CM | POA: Insufficient documentation

## 2013-10-21 DIAGNOSIS — R0602 Shortness of breath: Secondary | ICD-10-CM | POA: Diagnosis not present

## 2013-10-21 DIAGNOSIS — I10 Essential (primary) hypertension: Secondary | ICD-10-CM | POA: Diagnosis not present

## 2013-10-21 DIAGNOSIS — R0600 Dyspnea, unspecified: Secondary | ICD-10-CM

## 2013-10-21 DIAGNOSIS — E785 Hyperlipidemia, unspecified: Secondary | ICD-10-CM

## 2013-10-21 NOTE — Progress Notes (Signed)
Echocardiogram performed.  

## 2013-10-21 NOTE — Progress Notes (Signed)
Patient ID: Brandi Mccoy, female   DOB: 07/15/1921, 80 y.o.   MRN: [id number] E-Cardio 24 hour holter monitor applied to patient.

## 2013-11-15 ENCOUNTER — Telehealth: Payer: Self-pay

## 2013-11-15 NOTE — Telephone Encounter (Signed)
Patient called Dr.Jordan reviewed 24 hour monitor which revealed occasional pac's,otherwise normal.Advised to keep appointment with Dr.Jordan 12/13/13 at 10:15 am.

## 2013-11-26 ENCOUNTER — Encounter: Payer: Self-pay | Admitting: Cardiology

## 2013-12-13 ENCOUNTER — Ambulatory Visit: Payer: Medicare Other | Admitting: Cardiology

## 2013-12-18 DIAGNOSIS — E119 Type 2 diabetes mellitus without complications: Secondary | ICD-10-CM | POA: Diagnosis not present

## 2013-12-18 DIAGNOSIS — I10 Essential (primary) hypertension: Secondary | ICD-10-CM | POA: Diagnosis not present

## 2013-12-25 DIAGNOSIS — E119 Type 2 diabetes mellitus without complications: Secondary | ICD-10-CM | POA: Diagnosis not present

## 2013-12-25 DIAGNOSIS — E7800 Pure hypercholesterolemia, unspecified: Secondary | ICD-10-CM | POA: Diagnosis not present

## 2013-12-25 DIAGNOSIS — I10 Essential (primary) hypertension: Secondary | ICD-10-CM | POA: Diagnosis not present

## 2013-12-31 DIAGNOSIS — I10 Essential (primary) hypertension: Secondary | ICD-10-CM | POA: Diagnosis not present

## 2014-01-02 DIAGNOSIS — I10 Essential (primary) hypertension: Secondary | ICD-10-CM | POA: Diagnosis not present

## 2014-01-02 DIAGNOSIS — E119 Type 2 diabetes mellitus without complications: Secondary | ICD-10-CM | POA: Diagnosis not present

## 2014-01-02 DIAGNOSIS — E7800 Pure hypercholesterolemia, unspecified: Secondary | ICD-10-CM | POA: Diagnosis not present

## 2014-04-16 DIAGNOSIS — Z23 Encounter for immunization: Secondary | ICD-10-CM | POA: Diagnosis not present

## 2014-04-17 DIAGNOSIS — H00033 Abscess of eyelid right eye, unspecified eyelid: Secondary | ICD-10-CM | POA: Diagnosis not present

## 2014-05-29 ENCOUNTER — Other Ambulatory Visit: Payer: Self-pay | Admitting: Dermatology

## 2014-05-29 DIAGNOSIS — L570 Actinic keratosis: Secondary | ICD-10-CM | POA: Diagnosis not present

## 2014-05-29 DIAGNOSIS — Z85828 Personal history of other malignant neoplasm of skin: Secondary | ICD-10-CM | POA: Diagnosis not present

## 2014-05-29 DIAGNOSIS — D2311 Other benign neoplasm of skin of right eyelid, including canthus: Secondary | ICD-10-CM | POA: Diagnosis not present

## 2014-05-29 DIAGNOSIS — D485 Neoplasm of uncertain behavior of skin: Secondary | ICD-10-CM | POA: Diagnosis not present

## 2014-06-11 DIAGNOSIS — Z961 Presence of intraocular lens: Secondary | ICD-10-CM | POA: Diagnosis not present

## 2014-06-11 DIAGNOSIS — H35363 Drusen (degenerative) of macula, bilateral: Secondary | ICD-10-CM | POA: Diagnosis not present

## 2014-06-11 DIAGNOSIS — H3531 Nonexudative age-related macular degeneration: Secondary | ICD-10-CM | POA: Diagnosis not present

## 2014-06-11 DIAGNOSIS — H04123 Dry eye syndrome of bilateral lacrimal glands: Secondary | ICD-10-CM | POA: Diagnosis not present

## 2014-07-24 DIAGNOSIS — Z85828 Personal history of other malignant neoplasm of skin: Secondary | ICD-10-CM | POA: Diagnosis not present

## 2014-07-24 DIAGNOSIS — L820 Inflamed seborrheic keratosis: Secondary | ICD-10-CM | POA: Diagnosis not present

## 2014-07-24 DIAGNOSIS — L738 Other specified follicular disorders: Secondary | ICD-10-CM | POA: Diagnosis not present

## 2014-07-24 DIAGNOSIS — K130 Diseases of lips: Secondary | ICD-10-CM | POA: Diagnosis not present

## 2014-07-24 DIAGNOSIS — L570 Actinic keratosis: Secondary | ICD-10-CM | POA: Diagnosis not present

## 2014-11-05 DIAGNOSIS — Z85828 Personal history of other malignant neoplasm of skin: Secondary | ICD-10-CM | POA: Diagnosis not present

## 2014-11-05 DIAGNOSIS — H00011 Hordeolum externum right upper eyelid: Secondary | ICD-10-CM | POA: Diagnosis not present

## 2015-04-02 ENCOUNTER — Encounter (HOSPITAL_COMMUNITY): Payer: Self-pay | Admitting: Emergency Medicine

## 2015-04-02 ENCOUNTER — Emergency Department (HOSPITAL_COMMUNITY)
Admission: EM | Admit: 2015-04-02 | Discharge: 2015-04-02 | Disposition: A | Discharge status: Home / Self Care | Payer: Medicare Other | Attending: Emergency Medicine | Admitting: Emergency Medicine

## 2015-04-02 DIAGNOSIS — E785 Hyperlipidemia, unspecified: Secondary | ICD-10-CM | POA: Insufficient documentation

## 2015-04-02 DIAGNOSIS — Z7982 Long term (current) use of aspirin: Secondary | ICD-10-CM | POA: Insufficient documentation

## 2015-04-02 DIAGNOSIS — Z86010 Personal history of colonic polyps: Secondary | ICD-10-CM | POA: Diagnosis not present

## 2015-04-02 DIAGNOSIS — Z8659 Personal history of other mental and behavioral disorders: Secondary | ICD-10-CM | POA: Diagnosis not present

## 2015-04-02 DIAGNOSIS — K4091 Unilateral inguinal hernia, without obstruction or gangrene, recurrent: Secondary | ICD-10-CM | POA: Diagnosis not present

## 2015-04-02 DIAGNOSIS — E119 Type 2 diabetes mellitus without complications: Secondary | ICD-10-CM | POA: Insufficient documentation

## 2015-04-02 DIAGNOSIS — K4090 Unilateral inguinal hernia, without obstruction or gangrene, not specified as recurrent: Secondary | ICD-10-CM | POA: Diagnosis not present

## 2015-04-02 DIAGNOSIS — I10 Essential (primary) hypertension: Secondary | ICD-10-CM | POA: Insufficient documentation

## 2015-04-02 DIAGNOSIS — Z8719 Personal history of other diseases of the digestive system: Secondary | ICD-10-CM | POA: Insufficient documentation

## 2015-04-02 DIAGNOSIS — M1990 Unspecified osteoarthritis, unspecified site: Secondary | ICD-10-CM | POA: Diagnosis not present

## 2015-04-02 DIAGNOSIS — Z90710 Acquired absence of both cervix and uterus: Secondary | ICD-10-CM | POA: Insufficient documentation

## 2015-04-02 DIAGNOSIS — Z86018 Personal history of other benign neoplasm: Secondary | ICD-10-CM | POA: Diagnosis not present

## 2015-04-02 DIAGNOSIS — M810 Age-related osteoporosis without current pathological fracture: Secondary | ICD-10-CM | POA: Diagnosis not present

## 2015-04-02 DIAGNOSIS — Z79899 Other long term (current) drug therapy: Secondary | ICD-10-CM | POA: Insufficient documentation

## 2015-04-02 MED ORDER — MORPHINE SULFATE (PF) 4 MG/ML IV SOLN: 4.0000 mg | Freq: Once | INTRAVENOUS | Status: DC

## 2015-04-02 NOTE — Discharge Instructions (Signed)
Hernia, Adult A hernia is the bulging of an organ or tissue through a weak spot in the muscles of the abdomen (abdominal wall). Hernias develop most often near the navel or groin. There are many kinds of hernias. Common kinds include:  Femoral hernia. This kind of hernia develops under the groin in the upper thigh area.  Inguinal hernia. This kind of hernia develops in the groin or scrotum.  Umbilical hernia. This kind of hernia develops near the navel.  Hiatal hernia. This kind of hernia causes part of the stomach to be pushed up into the chest.  Incisional hernia. This kind of hernia bulges through a scar from an abdominal surgery. CAUSES This condition may be caused by:  Heavy lifting.  Coughing over a long period of time.  Straining to have a bowel movement.  An incision made during an abdominal surgery.  A birth defect (congenital defect).  Excess weight or obesity.  Smoking.  Poor nutrition.  Cystic fibrosis.  Excess fluid in the abdomen.  Undescended testicles. SYMPTOMS Symptoms of a hernia include:  A lump on the abdomen. This is the first sign of a hernia. The lump may become more obvious with standing, straining, or coughing. It may get bigger over time if it is not treated or if the condition causing it is not treated.  Pain. A hernia is usually painless, but it may become painful over time if treatment is delayed. The pain is usually dull and may get worse with standing or lifting heavy objects. Sometimes a hernia gets tightly squeezed in the weak spot (strangulated) or stuck there (incarcerated) and causes additional symptoms. These symptoms may include:  Vomiting.  Nausea.  Constipation.  Irritability. DIAGNOSIS A hernia may be diagnosed with:  A physical exam. During the exam your health care provider may ask you to cough or to make a specific movement, because a hernia is usually more visible when you move.  Imaging tests. These can  include:  X-rays.  Ultrasound.  CT scan. TREATMENT A hernia that is small and painless may not need to be treated. A hernia that is large or painful may be treated with surgery. Inguinal hernias may be treated with surgery to prevent incarceration or strangulation. Strangulated hernias are always treated with surgery, because lack of blood to the trapped organ or tissue can cause it to die. Surgery to treat a hernia involves pushing the bulge back into place and repairing the weak part of the abdomen. HOME CARE INSTRUCTIONS  Avoid straining.  Do not lift anything heavier than 10 lb (4.5 kg).  Lift with your leg muscles, not your back muscles. This helps avoid strain.  When coughing, try to cough gently.  Prevent constipation. Constipation leads to straining with bowel movements, which can make a hernia worse or cause a hernia repair to break down. You can prevent constipation by:  Eating a high-fiber diet that includes plenty of fruits and vegetables.  Drinking enough fluids to keep your urine clear or pale yellow. Aim to drink 6-8 glasses of water per day.  Using a stool softener as directed by your health care provider.  Lose weight, if you are overweight.  Do not use any tobacco products, including cigarettes, chewing tobacco, or electronic cigarettes. If you need help quitting, ask your health care provider.  Keep all follow-up visits as directed by your health care provider. This is important. Your health care provider may need to monitor your condition. SEEK MEDICAL CARE IF:  You have   swelling, redness, and pain in the affected area.  Your bowel habits change. SEEK IMMEDIATE MEDICAL CARE IF:  You have a fever.  You have abdominal pain that is getting worse.  You feel nauseous or you vomit.  You cannot push the hernia back in place by gently pressing on it while you are lying down.  The hernia:  Changes in shape or size.  Is stuck outside the  abdomen.  Becomes discolored.  Feels hard or tender.   This information is not intended to replace advice given to you by your health care provider. Make sure you discuss any questions you have with your health care provider.   Document Released: 06/13/2005 Document Revised: 07/04/2014 Document Reviewed: 04/23/2014 Elsevier Interactive Patient Education 2016 [employer]  

## 2015-04-02 NOTE — ED Provider Notes (Signed)
CSN: [id number]     Arrival date & time 04/02/15  1559 History   First MD Initiated Contact with Patient 04/02/15 1655     Chief Complaint  Patient presents with  . Inguinal Hernia     HPI Patient presents to emergency department complaining of severe right inguinal pain which began around 2 PM today.  She has a long-standing history of right inguinal hernia.  She reports today it swelled and became hard and she was unable to reduce it and became nauseated.  She did not vomit.  As well as can the ER.  On arrival to emergency department she reports resolution of all symptoms.  She reports it is no longer hard.  She reports when she lays flat it is easily reducible.  When she stands her hernia comes back out but may reduced at the bedside.  She reports the nausea is resolved.  The pain has resolved.  She's never seen general surgery about her right inguinal hernia.  Her pain was severe in severity.  Currently her pain is 0 out of 10   Past Medical History  Diagnosis Date  . Bilateral inguinal hernia   . Tubular adenoma     Shown on colonoscopy November 2008 and prior colonoscopy in 1998  . Hypertension   . Colonic polyp     Adenomatous  . Diverticulosis     Colon  . Osteoporosis   . GERD (gastroesophageal reflux disease)   . Hyperlipidemia   . Depression   . Arthritis   . H/O: hysterectomy 2003  . Diabetes mellitus type 2 in nonobese [facility])    Past Surgical History  Procedure Laterality Date  . Appendectomy    . Laparotomy     Family History  Problem Relation Age of Onset  . Heart disease Mother    Social History  Substance Use Topics  . Smoking status: Never Smoker   . Smokeless tobacco: None  . Alcohol Use: Yes   OB History    No data available     Review of Systems  All other systems reviewed and are negative.     Allergies  Review of patient's allergies indicates no known allergies.  Home Medications   Prior to Admission medications   Medication Sig Start  Date End Date Taking? Authorizing Provider  acyclovir (ZOVIRAX) 200 MG capsule Take 200 mg by mouth 5 (five) times daily. For ten days start 03/23/15 03/23/15  Yes Historical Provider, MD  aspirin 81 MG tablet Take 81 mg by mouth daily.   Yes Historical Provider, MD  Cholecalciferol (VITAMIN D-3 PO) Take by mouth. 1,000 ius daily   Yes Historical Provider, MD  eszopiclone (LUNESTA) 2 MG TABS tablet Take 2 mg by mouth at bedtime as needed. For sleep 03/05/15  Yes Historical Provider, MD  FLUoxetine (PROZAC) 20 MG capsule Take 20 mg by mouth daily. 02/22/15  Yes Historical Provider, MD  magic mouthwash w/lidocaine SOLN Take 30 mLs by mouth daily. Rinse with for 1 minute a 03/23/15  Yes Historical Provider, MD  Multiple Vitamins-Minerals (ICAPS) CAPS Take by mouth. 1 tab daily   Yes Historical Provider, MD  pioglitazone (ACTOS) 15 MG tablet Take 15 mg by mouth daily. 01/13/15  Yes Historical Provider, MD  quinapril (ACCUPRIL) 20 MG tablet Take 20 mg by mouth 2 (two) times daily. 02/28/15  Yes Historical Provider, MD   BP 99/64 mmHg  Pulse 69  Temp(Src) 98.3 F (36.8 C) (Oral)  Resp 16  SpO2 98% Physical  Exam  Constitutional: She is oriented to person, place, and time. She appears well-developed and well-nourished. No distress.  HENT:  Head: Normocephalic and atraumatic.  Eyes: EOM are normal.  Neck: Normal range of motion.  Cardiovascular: Normal rate, regular rhythm and normal heart sounds.   Pulmonary/Chest: Effort normal and breath sounds normal.  Abdominal: Soft. She exhibits no distension. There is no tenderness.  Easily reducible right inguinal hernia.  Baseball sized when standing at the bedside.  Able to be reduced without difficulty  Musculoskeletal: Normal range of motion.  Neurological: She is alert and oriented to person, place, and time.  Skin: Skin is warm and dry.  Psychiatric: She has a normal mood and affect. Judgment normal.  Nursing note and vitals reviewed.   ED Course   Procedures (including critical care time) Labs Review Labs Reviewed - No data to display  Imaging Review No results found. I have personally reviewed and evaluated these images and lab results as part of my medical decision-making.   EKG Interpretation None      MDM   Final diagnoses:  None    Patient sounds like she had an incarcerated hernia that now has since resolved on its own without intervention by myself.  She states on arrival to the emergency department her symptoms resolved.  Currently she has a very easily reducible large right inguinal hernia.  She'll be referred to general surgery.  She's been given return precautions.    Jola Schmidt, MD 04/02/15 1740

## 2015-04-02 NOTE — ED Notes (Signed)
Pt has had inguinal hernia that she has had x 3 years.  States that around 2 pm, she was unable to reduce it any further.  Having burning pain and is nauseated.  Upon assessment, hernia is unable to be reduced manually.

## 2015-04-22 DIAGNOSIS — I10 Essential (primary) hypertension: Secondary | ICD-10-CM | POA: Diagnosis not present

## 2015-04-22 DIAGNOSIS — E7800 Pure hypercholesterolemia, unspecified: Secondary | ICD-10-CM | POA: Diagnosis not present

## 2015-04-22 DIAGNOSIS — Z0000 Encounter for general adult medical examination without abnormal findings: Secondary | ICD-10-CM | POA: Diagnosis not present

## 2015-04-22 DIAGNOSIS — E119 Type 2 diabetes mellitus without complications: Secondary | ICD-10-CM | POA: Diagnosis not present

## 2015-04-28 DIAGNOSIS — I10 Essential (primary) hypertension: Secondary | ICD-10-CM | POA: Diagnosis not present

## 2015-04-28 DIAGNOSIS — K469 Unspecified abdominal hernia without obstruction or gangrene: Secondary | ICD-10-CM | POA: Diagnosis not present

## 2015-04-28 DIAGNOSIS — Z23 Encounter for immunization: Secondary | ICD-10-CM | POA: Diagnosis not present

## 2015-04-28 DIAGNOSIS — E119 Type 2 diabetes mellitus without complications: Secondary | ICD-10-CM | POA: Diagnosis not present

## 2015-04-28 DIAGNOSIS — E7800 Pure hypercholesterolemia, unspecified: Secondary | ICD-10-CM | POA: Diagnosis not present

## 2015-04-29 ENCOUNTER — Telehealth: Payer: Self-pay | Admitting: Cardiology

## 2015-04-29 NOTE — Telephone Encounter (Signed)
Received records from [facility] for appointment on 05/05/15 with Dr Martinique.  Records given to [facility] (medical records) for Dr Doug Sou schedule 05/05/15. lp

## 2015-05-05 ENCOUNTER — Ambulatory Visit: Payer: 59 | Admitting: Cardiology

## 2015-05-13 DIAGNOSIS — K4090 Unilateral inguinal hernia, without obstruction or gangrene, not specified as recurrent: Secondary | ICD-10-CM | POA: Diagnosis not present

## 2015-05-15 ENCOUNTER — Telehealth: Payer: Self-pay | Admitting: Cardiology

## 2015-05-15 NOTE — Telephone Encounter (Signed)
Received a call from [facility] with Dr.Sui's office.Patient needs surgical clearance for right inguinal hernia repair.Appointment scheduled with Truitt Merle NP 05/19/15 at 11:45 am at [facility] office.

## 2015-05-19 ENCOUNTER — Ambulatory Visit (INDEPENDENT_AMBULATORY_CARE_PROVIDER_SITE_OTHER): Payer: Medicare Other | Admitting: Nurse Practitioner

## 2015-05-19 ENCOUNTER — Encounter: Payer: Self-pay | Admitting: Nurse Practitioner

## 2015-05-19 VITALS — BP 220/80 | HR 69 | Ht 62.5 in | Wt 118.4 lb

## 2015-05-19 DIAGNOSIS — R0600 Dyspnea, unspecified: Secondary | ICD-10-CM | POA: Diagnosis not present

## 2015-05-19 DIAGNOSIS — I10 Essential (primary) hypertension: Secondary | ICD-10-CM

## 2015-05-19 DIAGNOSIS — E785 Hyperlipidemia, unspecified: Secondary | ICD-10-CM | POA: Diagnosis not present

## 2015-05-19 DIAGNOSIS — I421 Obstructive hypertrophic cardiomyopathy: Secondary | ICD-10-CM

## 2015-05-19 DIAGNOSIS — I447 Left bundle-branch block, unspecified: Secondary | ICD-10-CM | POA: Diagnosis not present

## 2015-05-19 DIAGNOSIS — Z01818 Encounter for other preprocedural examination: Secondary | ICD-10-CM

## 2015-05-19 MED ORDER — CARVEDILOL 3.125 MG PO TABS: 3.1250 mg | ORAL_TABLET | Freq: Two times a day (BID) | ORAL | Status: DC

## 2015-05-19 NOTE — Patient Instructions (Addendum)
We will be checking the following labs today -    Medication Instructions:    Continue with your current medicines.   I am adding Coreg 3.125 mg twice a day for your BP - I sent this to your drug store.     Testing/Procedures To Be Arranged:  Lexiscan  Echocardiogram  Follow-Up:   See me after your studies are complete to recheck your BP    Other Special Instructions:   N/A    If you need a refill on your cardiac medications before your next appointment, please call your pharmacy.   Call the [place] office at [phone] if you have any questions, problems or concerns.

## 2015-05-19 NOTE — Progress Notes (Signed)
CARDIOLOGY OFFICE NOTE  Date:  05/19/2015    Eduardo Osier Date of Birth: [birth year] Medical Record [id number]  PCP:  Jani Gravel, MD  Cardiologist:  Martin[place]    Chief Complaint  Patient presents with  . Pre-op Exam    Seen for Dr. Martinique    History of Present Illness: Brandi Mccoy is a 81 y.o. female who presents today for a pre op clearance visit. Seen for Dr. Martinique. She has a history of HTN, DM, and hyperlipidemia. No prior cardiac history. I have seen her husband extensively in the past.   She was referred to Dr. Martinique last April of 2015 with dyspnea and palpitations. EKG with LBBB. Holter and echo arranged. Holter with sinus brady, sinus tach, 1st degree AV block and PACs noted - no AF seen. Echo with asymmetric septal hypertrophy and normal LV function. Mild MR noted. Large LA - but no AF noted on holter.   Comes in today. Here alone - she did not wish for Fransisco Beau to be in the room with her today. She says she is just not able to talk with him present. She is quite stressed out - mostly because of him - that is a chronic finding. Needing surgical clearance for right inguinal hernia repair. Has had a hernia for 2 years - trying to deal with it - but in the ER last month due to what sounded like an incarcerated hernia - her symptoms resolved upon her arrival. Seeing Dr. Greer Pickerel at [facility]. Thus referred back here to get cleared. She remains very active for her age. Does all of her shopping, cooking, cleaning etc. No actual chest pain but still with some palpitations. BP pretty high here today - she does not check at home. We know that she had a LBBB on EKG in 2015 but otherwise no old tracing to compare to.   Past Medical History  Diagnosis Date  . Bilateral inguinal hernia   . Tubular adenoma     Shown on colonoscopy November 2008 and prior colonoscopy in 1998  . Hypertension   . Colonic polyp     Adenomatous  . Diverticulosis     Colon  . Osteoporosis   .  GERD (gastroesophageal reflux disease)   . Hyperlipidemia   . Depression   . Arthritis   . H/O: hysterectomy 2003  . Diabetes mellitus type 2 in nonobese [facility])     Past Surgical History  Procedure Laterality Date  . Appendectomy    . Laparotomy       Medications: Current Outpatient Prescriptions  Medication Sig Dispense Refill  . Cholecalciferol (VITAMIN D-3 PO) Take by mouth. 1,000 ius daily    . eszopiclone (LUNESTA) 2 MG TABS tablet Take 2 mg by mouth at bedtime as needed. For sleep    . FLUoxetine (PROZAC) 20 MG capsule Take 20 mg by mouth daily.  2  . pioglitazone (ACTOS) 15 MG tablet Take 15 mg by mouth daily.  3  . quinapril (ACCUPRIL) 20 MG tablet Take 20 mg by mouth 2 (two) times daily.  4   No current facility-administered medications for this visit.    Allergies: No Known Allergies  Social History: The patient  reports that she has never smoked. She does not have any smokeless tobacco history on file. She reports that she drinks alcohol. She reports that she does not use illicit drugs.   Family History: The patient's family history includes Heart disease in her  mother.   Review of Systems: Please see the history of present illness.   Otherwise, the review of systems is positive for none.   All other systems are reviewed and negative.   Physical Exam: VS:  BP 220/80 mmHg  Pulse 69  Ht 5' 2.5" (1.588 m)  Wt 118 lb 6.4 oz (53.706 kg)  BMI 21.30 kg/m2 .  BMI Body mass index is 21.3 kg/(m^2).  Wt Readings from Last 3 Encounters:  05/19/15 118 lb 6.4 oz (53.706 kg)  10/10/13 122 lb (55.339 kg)  04/20/10 120 lb (54.432 kg)    General: Pleasant. Elderly female who is alert and in no acute distress.  HEENT: Normal. Neck: Supple, no JVD, carotid bruits, or masses noted.  Cardiac: Regular rate and rhythm. Faint murmur - her heart tones are distant.  Trace edema.  Respiratory:  Lungs are clear to auscultation bilaterally with normal work of breathing.  GI: Soft  and nontender.  MS: No deformity or atrophy. Gait and ROM intact. Skin: Warm and dry. Color is normal.  Neuro:  Strength and sensation are intact and no gross focal deficits noted.  Psych: Alert, appropriate and with normal affect.   LABORATORY DATA:  EKG:  EKG is ordered today. This demonstrates NSR with LBBB.  No results found for: WBC, HGB, HCT, PLT, GLUCOSE, CHOL, TRIG, HDL, LDLDIRECT, LDLCALC, ALT, AST, NA, K, CL, CREATININE, BUN, CO2, TSH, PSA, INR, GLUF, HGBA1C, MICROALBUR  BNP (last 3 results) No results for input(s): BNP in the last 8760 hours.  ProBNP (last 3 results) No results for input(s): PROBNP in the last 8760 hours.   Other Studies Reviewed Today:  Echo Study Conclusions from 09/2013  - Left ventricle: The cavity size was normal. Wall thickness was normal. Severe assymetric septal hypertrophy - measuring 1.7 cm, consistent with hypertrophic cardiomyopathy. No evidence for rest LVOT obstruction. Systolic function was normal. The estimated ejection fraction was in the range of 60% to 65%. Wall motion was normal; there were no regional wall motion abnormalities. Doppler parameters are consistent with abnormal left ventricular relaxation (grade 1 diastolic dysfunction). The E/e' ratio is >10, suggesting elevated LV filling pressure. - Mitral valve: Heavily calcifiedposterior annulus. Mild regurgitation. - Left atrium: Severely dilated (>40 ml/m2).  Assessment/Plan: 1. Pre op clearance - needing surgical clearance for hernia repair - she is felt to be at increased risk just from her age. She has not had stress testing. Discussed with Dr. Curt Bears here in the office today - he agrees with my plan of care of arranging for Lexiscan, updating echo and getting BP down and then proceed with surgery.   2. HTN - BP is pretty high here today - she does not check at home - adding Coreg 3.125 mg BID today.   3. LBBB - present for at least 1 1/2  years  4. Hypertrophic CM with normal LV function - will get her echo updated.   5. Palpitations - still present - adding low dose Coreg today. Will need to watch for bradycardia. HR is good here today.   Current medicines are reviewed with the patient today.  The patient does not have concerns regarding medicines other than what has been noted above.  The following changes have been made:  See above.  Labs/ tests ordered today include:   No orders of the defined types were placed in this encounter.     Disposition:   FU with me after her studies are complete to recheck BP and hopefully  then proceed on with surgical intervention.   Patient is agreeable to this plan and will call if any problems develop in the interim.   Signed: Burtis Junes, RN, ANP-C 05/19/2015 12:12 PM  [place] [address] [place], [place]  [address] Phone: [phone] Fax: [phone]

## 2015-05-26 ENCOUNTER — Telehealth (HOSPITAL_COMMUNITY): Payer: Self-pay

## 2015-05-26 NOTE — Telephone Encounter (Signed)
Encounter complete. 

## 2015-05-27 ENCOUNTER — Ambulatory Visit (HOSPITAL_COMMUNITY)
Admission: RE | Admit: 2015-05-27 | Discharge: 2015-05-27 | Disposition: A | Discharge status: Home / Self Care | Payer: Medicare Other | Source: Ambulatory Visit | Attending: Internal Medicine | Admitting: Internal Medicine

## 2015-05-27 ENCOUNTER — Telehealth: Payer: Self-pay | Admitting: Cardiology

## 2015-05-27 ENCOUNTER — Ambulatory Visit (HOSPITAL_BASED_OUTPATIENT_CLINIC_OR_DEPARTMENT_OTHER)
Admission: RE | Admit: 2015-05-27 | Discharge: 2015-05-27 | Disposition: A | Discharge status: Home / Self Care | Payer: Medicare Other | Source: Ambulatory Visit | Attending: Nurse Practitioner | Admitting: Nurse Practitioner

## 2015-05-27 DIAGNOSIS — R002 Palpitations: Secondary | ICD-10-CM | POA: Insufficient documentation

## 2015-05-27 DIAGNOSIS — Z01818 Encounter for other preprocedural examination: Secondary | ICD-10-CM | POA: Diagnosis not present

## 2015-05-27 DIAGNOSIS — I340 Nonrheumatic mitral (valve) insufficiency: Secondary | ICD-10-CM | POA: Diagnosis not present

## 2015-05-27 DIAGNOSIS — I10 Essential (primary) hypertension: Secondary | ICD-10-CM | POA: Insufficient documentation

## 2015-05-27 DIAGNOSIS — M7989 Other specified soft tissue disorders: Secondary | ICD-10-CM | POA: Diagnosis not present

## 2015-05-27 DIAGNOSIS — I421 Obstructive hypertrophic cardiomyopathy: Secondary | ICD-10-CM

## 2015-05-27 DIAGNOSIS — R0600 Dyspnea, unspecified: Secondary | ICD-10-CM

## 2015-05-27 DIAGNOSIS — E119 Type 2 diabetes mellitus without complications: Secondary | ICD-10-CM | POA: Insufficient documentation

## 2015-05-27 DIAGNOSIS — I447 Left bundle-branch block, unspecified: Secondary | ICD-10-CM

## 2015-05-27 DIAGNOSIS — R0602 Shortness of breath: Secondary | ICD-10-CM | POA: Insufficient documentation

## 2015-05-27 DIAGNOSIS — E785 Hyperlipidemia, unspecified: Secondary | ICD-10-CM

## 2015-05-27 DIAGNOSIS — Z8249 Family history of ischemic heart disease and other diseases of the circulatory system: Secondary | ICD-10-CM | POA: Diagnosis not present

## 2015-05-27 DIAGNOSIS — I517 Cardiomegaly: Secondary | ICD-10-CM | POA: Diagnosis not present

## 2015-05-27 DIAGNOSIS — M79604 Pain in right leg: Secondary | ICD-10-CM | POA: Diagnosis not present

## 2015-05-27 LAB — MYOCARDIAL PERFUSION IMAGING
LV dias vol: 82 mL
LV sys vol: 30 mL
Peak HR: 82 {beats}/min
Rest HR: 63 {beats}/min
SDS: 5
SRS: 2
SSS: 5
TID: 1.13

## 2015-05-27 MED ORDER — AMINOPHYLLINE 25 MG/ML IV SOLN
75.0000 mg | Freq: Once | INTRAVENOUS | Status: AC
  Administered 2015-05-27: 75 mg via INTRAVENOUS

## 2015-05-27 MED ORDER — REGADENOSON 0.4 MG/5ML IV SOLN
0.4000 mg | Freq: Once | INTRAVENOUS | Status: AC
  Administered 2015-05-27: 0.4 mg via INTRAVENOUS

## 2015-05-27 MED ORDER — TECHNETIUM TC 99M SESTAMIBI GENERIC - CARDIOLITE
10.9000 | Freq: Once | INTRAVENOUS | Status: AC | PRN
  Administered 2015-05-27: 10.9 via INTRAVENOUS

## 2015-05-27 MED ORDER — TECHNETIUM TC 99M SESTAMIBI GENERIC - CARDIOLITE
32.5000 | Freq: Once | INTRAVENOUS | Status: AC | PRN
  Administered 2015-05-27: 32.5 via INTRAVENOUS

## 2015-05-27 NOTE — Telephone Encounter (Signed)
Received records from [facility] for appointment on 08/10/15 with Dr Martinique.  Records given to [facility] (medical records) for Dr Doug Sou schedule on 08/10/15. lp

## 2015-06-01 DIAGNOSIS — E7800 Pure hypercholesterolemia, unspecified: Secondary | ICD-10-CM | POA: Diagnosis not present

## 2015-06-01 DIAGNOSIS — R220 Localized swelling, mass and lump, head: Secondary | ICD-10-CM | POA: Diagnosis not present

## 2015-06-01 DIAGNOSIS — B370 Candidal stomatitis: Secondary | ICD-10-CM | POA: Diagnosis not present

## 2015-06-01 DIAGNOSIS — E119 Type 2 diabetes mellitus without complications: Secondary | ICD-10-CM | POA: Diagnosis not present

## 2015-06-04 DIAGNOSIS — Z85828 Personal history of other malignant neoplasm of skin: Secondary | ICD-10-CM | POA: Diagnosis not present

## 2015-06-04 DIAGNOSIS — L986 Other infiltrative disorders of the skin and subcutaneous tissue: Secondary | ICD-10-CM | POA: Diagnosis not present

## 2015-06-04 DIAGNOSIS — L438 Other lichen planus: Secondary | ICD-10-CM | POA: Diagnosis not present

## 2015-06-08 DIAGNOSIS — E119 Type 2 diabetes mellitus without complications: Secondary | ICD-10-CM | POA: Diagnosis not present

## 2015-06-08 DIAGNOSIS — I10 Essential (primary) hypertension: Secondary | ICD-10-CM | POA: Diagnosis not present

## 2015-06-08 DIAGNOSIS — E7800 Pure hypercholesterolemia, unspecified: Secondary | ICD-10-CM | POA: Diagnosis not present

## 2015-06-08 DIAGNOSIS — K130 Diseases of lips: Secondary | ICD-10-CM | POA: Diagnosis not present

## 2015-07-10 DIAGNOSIS — I10 Essential (primary) hypertension: Secondary | ICD-10-CM | POA: Diagnosis not present

## 2015-07-10 DIAGNOSIS — R220 Localized swelling, mass and lump, head: Secondary | ICD-10-CM | POA: Diagnosis not present

## 2015-07-17 DIAGNOSIS — R229 Localized swelling, mass and lump, unspecified: Secondary | ICD-10-CM | POA: Diagnosis not present

## 2015-07-17 DIAGNOSIS — I10 Essential (primary) hypertension: Secondary | ICD-10-CM | POA: Diagnosis not present

## 2015-07-17 DIAGNOSIS — E119 Type 2 diabetes mellitus without complications: Secondary | ICD-10-CM | POA: Diagnosis not present

## 2015-08-10 ENCOUNTER — Ambulatory Visit: Payer: 59 | Admitting: Cardiology

## 2015-08-25 DIAGNOSIS — K146 Glossodynia: Secondary | ICD-10-CM | POA: Diagnosis not present

## 2015-08-25 DIAGNOSIS — K117 Disturbances of salivary secretion: Secondary | ICD-10-CM | POA: Diagnosis not present

## 2015-10-26 DIAGNOSIS — E119 Type 2 diabetes mellitus without complications: Secondary | ICD-10-CM | POA: Diagnosis not present

## 2015-10-26 DIAGNOSIS — I10 Essential (primary) hypertension: Secondary | ICD-10-CM | POA: Diagnosis not present

## 2015-11-04 DIAGNOSIS — E119 Type 2 diabetes mellitus without complications: Secondary | ICD-10-CM | POA: Diagnosis not present

## 2015-11-04 DIAGNOSIS — D649 Anemia, unspecified: Secondary | ICD-10-CM | POA: Diagnosis not present

## 2015-11-04 DIAGNOSIS — I10 Essential (primary) hypertension: Secondary | ICD-10-CM | POA: Diagnosis not present

## 2015-11-04 DIAGNOSIS — M7989 Other specified soft tissue disorders: Secondary | ICD-10-CM | POA: Diagnosis not present

## 2015-11-13 ENCOUNTER — Observation Stay (HOSPITAL_COMMUNITY)
Admission: AD | Admit: 2015-11-13 | Discharge: 2015-11-15 | Disposition: A | Discharge status: Home / Self Care | Payer: Medicare Other | Source: Other Acute Inpatient Hospital | Attending: Internal Medicine | Admitting: Internal Medicine

## 2015-11-13 DIAGNOSIS — Z90710 Acquired absence of both cervix and uterus: Secondary | ICD-10-CM | POA: Diagnosis not present

## 2015-11-13 DIAGNOSIS — K219 Gastro-esophageal reflux disease without esophagitis: Secondary | ICD-10-CM | POA: Diagnosis not present

## 2015-11-13 DIAGNOSIS — Y9289 Other specified places as the place of occurrence of the external cause: Secondary | ICD-10-CM | POA: Diagnosis not present

## 2015-11-13 DIAGNOSIS — T1490 Injury, unspecified: Secondary | ICD-10-CM | POA: Diagnosis not present

## 2015-11-13 DIAGNOSIS — E785 Hyperlipidemia, unspecified: Secondary | ICD-10-CM | POA: Diagnosis not present

## 2015-11-13 DIAGNOSIS — S4991XA Unspecified injury of right shoulder and upper arm, initial encounter: Secondary | ICD-10-CM | POA: Diagnosis not present

## 2015-11-13 DIAGNOSIS — S0990XA Unspecified injury of head, initial encounter: Secondary | ICD-10-CM | POA: Diagnosis not present

## 2015-11-13 DIAGNOSIS — Y998 Other external cause status: Secondary | ICD-10-CM | POA: Diagnosis not present

## 2015-11-13 DIAGNOSIS — M549 Dorsalgia, unspecified: Secondary | ICD-10-CM | POA: Diagnosis present

## 2015-11-13 DIAGNOSIS — S3993XA Unspecified injury of pelvis, initial encounter: Secondary | ICD-10-CM | POA: Diagnosis not present

## 2015-11-13 DIAGNOSIS — S32018A Other fracture of first lumbar vertebra, initial encounter for closed fracture: Secondary | ICD-10-CM | POA: Diagnosis not present

## 2015-11-13 DIAGNOSIS — M810 Age-related osteoporosis without current pathological fracture: Secondary | ICD-10-CM | POA: Insufficient documentation

## 2015-11-13 DIAGNOSIS — Y9389 Activity, other specified: Secondary | ICD-10-CM | POA: Diagnosis not present

## 2015-11-13 DIAGNOSIS — F329 Major depressive disorder, single episode, unspecified: Secondary | ICD-10-CM | POA: Diagnosis not present

## 2015-11-13 DIAGNOSIS — W010XXA Fall on same level from slipping, tripping and stumbling without subsequent striking against object, initial encounter: Secondary | ICD-10-CM | POA: Insufficient documentation

## 2015-11-13 DIAGNOSIS — M1990 Unspecified osteoarthritis, unspecified site: Secondary | ICD-10-CM | POA: Diagnosis not present

## 2015-11-13 DIAGNOSIS — D649 Anemia, unspecified: Secondary | ICD-10-CM | POA: Diagnosis not present

## 2015-11-13 DIAGNOSIS — S32019A Unspecified fracture of first lumbar vertebra, initial encounter for closed fracture: Secondary | ICD-10-CM | POA: Diagnosis present

## 2015-11-13 DIAGNOSIS — S299XXA Unspecified injury of thorax, initial encounter: Secondary | ICD-10-CM | POA: Diagnosis not present

## 2015-11-13 DIAGNOSIS — E119 Type 2 diabetes mellitus without complications: Secondary | ICD-10-CM | POA: Insufficient documentation

## 2015-11-13 DIAGNOSIS — Z86010 Personal history of colonic polyps: Secondary | ICD-10-CM | POA: Insufficient documentation

## 2015-11-13 DIAGNOSIS — S32029A Unspecified fracture of second lumbar vertebra, initial encounter for closed fracture: Secondary | ICD-10-CM | POA: Diagnosis not present

## 2015-11-13 DIAGNOSIS — S79911A Unspecified injury of right hip, initial encounter: Secondary | ICD-10-CM | POA: Diagnosis not present

## 2015-11-13 DIAGNOSIS — S32000A Wedge compression fracture of unspecified lumbar vertebra, initial encounter for closed fracture: Secondary | ICD-10-CM | POA: Diagnosis not present

## 2015-11-13 DIAGNOSIS — I10 Essential (primary) hypertension: Secondary | ICD-10-CM | POA: Diagnosis not present

## 2015-11-13 DIAGNOSIS — R42 Dizziness and giddiness: Secondary | ICD-10-CM | POA: Diagnosis not present

## 2015-11-13 MED ORDER — FLUOXETINE HCL 20 MG PO CAPS
20.0000 mg | ORAL_CAPSULE | Freq: Every day | ORAL | Status: DC
  Administered 2015-11-14 – 2015-11-15 (×2): 20 mg via ORAL
  Filled 2015-11-13 (×2): qty 1

## 2015-11-13 MED ORDER — HYDROCODONE-ACETAMINOPHEN 5-325 MG PO TABS
1.0000 | ORAL_TABLET | ORAL | Status: DC | PRN
Start: 2015-11-13 — End: 2015-11-14

## 2015-11-13 MED ORDER — INSULIN ASPART 100 UNIT/ML ~~LOC~~ SOLN
0.0000 [IU] | Freq: Three times a day (TID) | SUBCUTANEOUS | Status: DC
  Administered 2015-11-14: 1 [IU] via SUBCUTANEOUS

## 2015-11-13 MED ORDER — AMLODIPINE BESYLATE 5 MG PO TABS: 2.5000 mg | ORAL_TABLET | Freq: Every day | ORAL | Status: DC

## 2015-11-14 ENCOUNTER — Encounter (HOSPITAL_COMMUNITY): Payer: Self-pay | Admitting: *Deleted

## 2015-11-14 DIAGNOSIS — E785 Hyperlipidemia, unspecified: Secondary | ICD-10-CM | POA: Diagnosis not present

## 2015-11-14 DIAGNOSIS — E119 Type 2 diabetes mellitus without complications: Secondary | ICD-10-CM

## 2015-11-14 DIAGNOSIS — S32019A Unspecified fracture of first lumbar vertebra, initial encounter for closed fracture: Secondary | ICD-10-CM | POA: Diagnosis present

## 2015-11-14 DIAGNOSIS — S32000A Wedge compression fracture of unspecified lumbar vertebra, initial encounter for closed fracture: Secondary | ICD-10-CM

## 2015-11-14 DIAGNOSIS — I10 Essential (primary) hypertension: Secondary | ICD-10-CM | POA: Diagnosis not present

## 2015-11-14 DIAGNOSIS — S32018A Other fracture of first lumbar vertebra, initial encounter for closed fracture: Secondary | ICD-10-CM | POA: Diagnosis not present

## 2015-11-14 DIAGNOSIS — D649 Anemia, unspecified: Secondary | ICD-10-CM | POA: Diagnosis not present

## 2015-11-14 DIAGNOSIS — K219 Gastro-esophageal reflux disease without esophagitis: Secondary | ICD-10-CM | POA: Diagnosis not present

## 2015-11-14 LAB — GLUCOSE, CAPILLARY
GLUCOSE-CAPILLARY: 133 mg/dL — AB (ref 65–99)
GLUCOSE-CAPILLARY: 114 mg/dL — AB (ref 65–99)
GLUCOSE-CAPILLARY: 107 mg/dL — AB (ref 65–99)
Glucose-Capillary: 153 mg/dL — ABNORMAL HIGH (ref 65–99)

## 2015-11-14 LAB — CBC
HCT: 34.6 % — ABNORMAL LOW (ref 36.0–46.0)
Hemoglobin: 10.5 g/dL — ABNORMAL LOW (ref 12.0–15.0)
MCH: 28 pg (ref 26.0–34.0)
MCHC: 30.3 g/dL (ref 30.0–36.0)
MCV: 92.3 fL (ref 78.0–100.0)
PLATELETS: 213 10*3/uL (ref 150–400)
RBC: 3.75 MIL/uL — AB (ref 3.87–5.11)
RDW: 14.9 % (ref 11.5–15.5)
WBC: 8.4 10*3/uL (ref 4.0–10.5)

## 2015-11-14 LAB — BASIC METABOLIC PANEL
ANION GAP: 9 (ref 5–15)
BUN: 25 mg/dL — ABNORMAL HIGH (ref 6–20)
CO2: 29 mmol/L (ref 22–32)
Calcium: 9.4 mg/dL (ref 8.9–10.3)
Chloride: 103 mmol/L (ref 101–111)
Creatinine, Ser: 1.25 mg/dL — ABNORMAL HIGH (ref 0.44–1.00)
GFR, EST AFRICAN AMERICAN: 42 mL/min — AB (ref >60)
GFR, EST NON AFRICAN AMERICAN: 36 mL/min — AB (ref >60)
GLUCOSE: 176 mg/dL — AB (ref 65–99)
POTASSIUM: 4.2 mmol/L (ref 3.5–5.1)
Sodium: 141 mmol/L (ref 135–145)

## 2015-11-14 MED ORDER — ENSURE ENLIVE PO LIQD: 237.0000 mL | Freq: Two times a day (BID) | ORAL | Status: DC

## 2015-11-14 MED ORDER — ENOXAPARIN SODIUM 30 MG/0.3ML ~~LOC~~ SOLN
30.0000 mg | SUBCUTANEOUS | Status: DC
  Administered 2015-11-14: 30 mg via SUBCUTANEOUS
  Filled 2015-11-14: qty 0.3

## 2015-11-14 MED ORDER — GLUCERNA SHAKE PO LIQD
237.0000 mL | Freq: Two times a day (BID) | ORAL | Status: DC
  Filled 2015-11-14 (×2): qty 237

## 2015-11-14 MED ORDER — AMLODIPINE BESYLATE 5 MG PO TABS
2.5000 mg | ORAL_TABLET | Freq: Every day | ORAL | Status: DC
  Administered 2015-11-14 – 2015-11-15 (×2): 2.5 mg via ORAL
  Filled 2015-11-14 (×3): qty 1

## 2015-11-14 MED ORDER — HYDROCODONE-ACETAMINOPHEN 5-325 MG PO TABS: 1.0000 | ORAL_TABLET | Freq: Four times a day (QID) | ORAL | Status: DC | PRN

## 2015-11-14 MED ORDER — ACETAMINOPHEN 325 MG PO TABS
650.0000 mg | ORAL_TABLET | Freq: Four times a day (QID) | ORAL | Status: DC | PRN
Start: 2015-11-14 — End: 2015-11-15
  Administered 2015-11-14: 650 mg via ORAL
  Filled 2015-11-14: qty 2

## 2015-11-14 MED ORDER — GLUCERNA SHAKE PO LIQD: 237.0000 mL | Freq: Three times a day (TID) | ORAL | Status: DC

## 2015-11-14 NOTE — Progress Notes (Signed)
[facility] PROGRESS NOTE  MERIEM MIELNIK [id number] DOB: 06-Feb-1922 DOA: 11/13/2015  PCP: Jani Gravel, MD  Brief HPI: 82 year old Asian female with a past medical history of type 2 diabetes, hypertension, sustained a mechanical fall while she was near [facility] in [place]. She developed severe back pain after this fall. She went to the local emergency department and was found to have superior endplate fracture of L1. Patient is from [place] and requested transfer to this facility.  Past medical history:  Past Medical History  Diagnosis Date  . Bilateral inguinal hernia   . Tubular adenoma     Shown on colonoscopy November 2008 and prior colonoscopy in 1998  . Hypertension   . Colonic polyp     Adenomatous  . Diverticulosis     Colon  . Osteoporosis   . GERD (gastroesophageal reflux disease)   . Hyperlipidemia   . Depression   . Arthritis   . H/O: hysterectomy 2003  . Diabetes mellitus type 2 in nonobese [facility])     Consultants: Phone discussion with Dr. Joya Salm with neurosurgery  Procedures: None  Antibiotics: None  Subjective: Patient feels much better this morning. She states that her lower back pain is significantly improved. She was able to get up to the bedside commode and is willing to ambulate. Denies any nausea, vomiting, abdominal pain. No headaches.  Objective:  Vital Signs  Filed Vitals:   11/13/15 2340 11/14/15 0423 11/14/15 1025  BP: 163/52 172/54 144/57  Pulse: 69 71   Temp: 97.9 F (36.6 C) 98 F (36.7 C)   TempSrc: Oral Oral   Resp: 20 18   Height: 5\' 2"  (1.575 m)    Weight: 49.896 kg (110 lb)    SpO2: 95% 97%     Intake/Output Summary (Last 24 hours) at 11/14/15 1200 Last data filed at 11/14/15 0900  Gross per 24 hour  Intake    240 ml  Output    800 ml  Net   -560 ml   Filed Weights   11/13/15 2340  Weight: 49.896 kg (110 lb)    General appearance: alert, cooperative, appears stated age and no distress Resp:  clear to auscultation bilaterally Cardio: regular rate and rhythm, S1, S2 normal, no murmur, click, rub or gallop GI: soft, non-tender; bowel sounds normal; no masses,  no organomegaly Extremities: extremities normal, atraumatic, no cyanosis or edema Neurologic: Awake and alert. Oriented 3. No focal neurological deficits. Able to lift both legs off the bed. Good strength bilateral lower extremities.  Lab Results:  Data Reviewed: I have personally reviewed following labs and imaging studies  CBC:  Recent Labs Lab 11/14/15 0357  WBC 8.4  HGB 10.5*  HCT 34.6*  MCV 92.3  PLT 123456   Basic Metabolic Panel:  Recent Labs Lab 11/14/15 0357  NA 141  K 4.2  CL 103  CO2 29  GLUCOSE 176*  BUN 25*  CREATININE 1.25*  CALCIUM 9.4   CBG:  Recent Labs Lab 11/14/15 0604 11/14/15 1129  GLUCAP 133* 114*     Radiology Studies: No results found.   Medications:  Scheduled: . amLODipine  2.5 mg Oral Daily  . enoxaparin (LOVENOX) injection  30 mg Subcutaneous Q24H  . feeding supplement (GLUCERNA SHAKE)  237 mL Oral TID BM  . FLUoxetine  20 mg Oral Daily  . insulin aspart  0-9 Units Subcutaneous TID WC   Continuous:  [id number], HYDROcodone-acetaminophen  Assessment/Plan:  Principal Problem:   Compression fracture of lumbar  vertebra ([place]) Active Problems:   DM2 (diabetes mellitus, type 2) ([place])   Essential hypertension   L1 vertebral fracture (HCC)    Compression fracture of L1 vertebrae Patient does not have any neurological deficits. CT scan report from the outside facility reviewed. Patient is significantly improved in terms of symptoms. Discussed with Dr. Joya Salm with neurosurgery. PT and OT ordered. If patient is able to function without significant pain. Then she will only need his medical management. If pain is a limiting factor, then she may benefit from a TLSO brace. Otherwise, Dr. Joya Salm would like to see the patient in his office for follow-up. No  indication for acute surgical intervention at this time.  History of essential hypertension Stable. Continue Norvasc. Blood pressure was elevated, likely due to pain. Monitor for now.  History of her type 2 diabetes mellitus Continue to monitor CBGs. Sliding scale coverage.  Mildly elevated BUN and creatinine Old labs are not available. She could see have CKD although it's not clear. Can be pursued further as outpatient.  Normocytic anemia No baseline hemoglobin available in our system. No evidence for overt bleeding. Monitor for now.   DVT Prophylaxis: Lovenox    Code Status: Full code  Family Communication: Discussed with patient  Disposition Plan: Mobilize. PTOT evaluation. Pain control. Anticipate discharge tomorrow.    LOS: 1 day   [facility] Pager [phone] 11/14/2015, 12:00 PM  If 7PM-7AM, please contact night-coverage at [email or web address], password [email or web address]

## 2015-11-14 NOTE — Evaluation (Signed)
Physical Therapy Evaluation Patient Details Name: Brandi Mccoy MRN: [id number] DOB: [birth year] Today's Date: 11/14/2015   History of Present Illness  Patient is a 82 yo female admitted 11/13/15 following a fall while on vacation.  Patient with severe back pain.  Found to have lumbar compression fracture.    PMH:  DM, HTN, osteoporosis, arthritis.  Clinical Impression  Patient is functioning at supervision level with all mobility and gait.  Encouraged use of RW for ambulation for safety/balance.  Feel patient should continue to improve to PLOF as pain decreases.  No further acute PT needs identified - PT will sign off.    Follow Up Recommendations No PT follow up;Supervision for mobility/OOB    Equipment Recommendations  None recommended by PT    Recommendations for Other Services       Precautions / Restrictions Precautions Precautions: Fall Restrictions Weight Bearing Restrictions: No      Mobility  Bed Mobility               General bed mobility comments: Patient in recliner as PT entered room  Transfers Overall transfer level: Modified independent Equipment used: Rolling walker (2 wheeled)             General transfer comment: Patient used correct hand placement.  Ambulation/Gait Ambulation/Gait assistance: Supervision Ambulation Distance (Feet): 110 Feet Assistive device: Rolling walker (2 wheeled);None Gait Pattern/deviations: Step-through pattern;Decreased step length - right;Decreased step length - left;Decreased stride length;Trunk flexed Gait velocity: decreased   General Gait Details: Patient with slow, steady gait with use of RW.  Attempted to ambulate without RW, with decreased balance/safety.  Encouraged use of RW at all times.  Stairs            Wheelchair Mobility    Modified Rankin (Stroke Patients Only)       Balance Overall balance assessment: Needs assistance         Standing balance support: No upper extremity  supported Standing balance-Leahy Scale: Fair Standing balance comment: Requires UE support with dynamic activities                             Pertinent Vitals/Pain Pain Assessment: Faces Faces Pain Scale: Hurts a little bit Pain Location: Low back and Rt hip Pain Descriptors / Indicators: Sore Pain Intervention(s): Limited activity within patient's tolerance;Monitored during session;Repositioned    Home Living Family/patient expects to be discharged to:: Private residence Living Arrangements: Spouse/significant other Available Help at Discharge: Family;Available 24 hours/day Type of Home: House Home Access: Ramped entrance     Home Layout: One level Home Equipment: Walker - 2 wheels;Walker - 4 wheels;Cane - single point;Hand held shower head;Grab bars - tub/shower      Prior Function Level of Independence: Independent         Comments: Very active pta     Hand Dominance        Extremity/Trunk Assessment   Upper Extremity Assessment: Overall WFL for tasks assessed           Lower Extremity Assessment: Generalized weakness      Cervical / Trunk Assessment: Kyphotic  Communication   Communication: No difficulties  Cognition Arousal/Alertness: Awake/alert Behavior During Therapy: WFL for tasks assessed/performed Overall Cognitive Status: Within Functional Limits for tasks assessed                      General Comments      Exercises  Assessment/Plan    PT Assessment Patent does not need any further PT services  PT Diagnosis Difficulty walking;Generalized weakness;Acute pain   PT Problem List    PT Treatment Interventions     PT Goals (Current goals can be found in the Care Plan section) Acute Rehab PT Goals PT Goal Formulation: All assessment and education complete, DC therapy    Frequency     Barriers to discharge        Co-evaluation               End of Session Equipment Utilized During Treatment:  Gait belt Activity Tolerance: Patient tolerated treatment well Patient left: in chair;with call bell/phone within reach;with family/visitor present Nurse Communication: Mobility status    Functional Assessment Tool Used: Clinical judgement Functional Limitation: Mobility: Walking and moving around Mobility: Walking and Moving Around Current Status [id number]): At least 1 percent but less than 20 percent impaired, limited or restricted Mobility: Walking and Moving Around Goal Status [phone]): At least 1 percent but less than 20 percent impaired, limited or restricted Mobility: Walking and Moving Around Discharge Status [phone]): At least 1 percent but less than 20 percent impaired, limited or restricted    Time: [id number] PT Time Calculation (min) (ACUTE ONLY): 18 min   Charges:   PT Evaluation $PT Eval Moderate Complexity: 1 Procedure     PT G Codes:   PT G-Codes **NOT FOR INPATIENT CLASS** Functional Assessment Tool Used: Clinical judgement Functional Limitation: Mobility: Walking and moving around Mobility: Walking and Moving Around Current Status [id number]): At least 1 percent but less than 20 percent impaired, limited or restricted Mobility: Walking and Moving Around Goal Status [phone]): At least 1 percent but less than 20 percent impaired, limited or restricted Mobility: Walking and Moving Around Discharge Status [id number]): At least 1 percent but less than 20 percent impaired, limited or restricted    Despina Pole 11/14/2015, 4:23 PM Carita Pian. Sanjuana Kava, [place] Pager [phone]

## 2015-11-14 NOTE — H&P (Signed)
History and Physical    Brandi Mccoy [id number] DOB: 05-19-1922 DOA: 11/13/2015  Referring MD/NP/PA: Dr. Cristela Blue PCP: Jani Gravel, MD Outpatient Specialists: None Patient coming from: [place], [place]  Chief Complaint: Back pain  HPI: Brandi Mccoy is a 82 y.o. female with medical history significant of DM2, HTN, patient presents to the ED in [place], [place], with c/o severe back pain after a fall.  She was visiting the area and suffered a mechanical fall.  Severe pain with movement at that time, better at rest.  Pain has improved significantly throughout the course of the day since the initial presentation in ED despite taking only tylenol for pain control.  ED Course: In ED she was found to have a lumbar veterbral compression fracture superior end plant with 30% height loss: this is either L1 or L2 (the official CT scan reports L2 in the body of the report but L1 in the impression section of the report).  Patient transferred to [facility] at family request.  Review of Systems: As per HPI otherwise 10 point review of systems negative.    Past Medical History  Diagnosis Date  . Bilateral inguinal hernia   . Tubular adenoma     Shown on colonoscopy November 2008 and prior colonoscopy in 1998  . Hypertension   . Colonic polyp     Adenomatous  . Diverticulosis     Colon  . Osteoporosis   . GERD (gastroesophageal reflux disease)   . Hyperlipidemia   . Depression   . Arthritis   . H/O: hysterectomy 2003  . Diabetes mellitus type 2 in nonobese [facility])     Past Surgical History  Procedure Laterality Date  . Appendectomy    . Laparotomy       reports that she has never smoked. She does not have any smokeless tobacco history on file. She reports that she drinks alcohol. She reports that she does not use illicit drugs.  No Known Allergies  Family History  Problem Relation Age of Onset  . Heart disease Mother      Prior to Admission medications   Medication Sig Start Date End  Date Taking? Authorizing Provider  carvedilol (COREG) 3.125 MG tablet Take 1 tablet (3.125 mg total) by mouth 2 (two) times daily. 05/19/15   Burtis Junes, NP  Cholecalciferol (VITAMIN D-3 PO) Take by mouth. 1,000 ius daily    Historical Provider, MD  eszopiclone (LUNESTA) 2 MG TABS tablet Take 2 mg by mouth at bedtime as needed. For sleep 03/05/15   Historical Provider, MD  FLUoxetine (PROZAC) 20 MG capsule Take 20 mg by mouth daily. 02/22/15   Historical Provider, MD  pioglitazone (ACTOS) 15 MG tablet Take 15 mg by mouth daily. 01/13/15   Historical Provider, MD  quinapril (ACCUPRIL) 20 MG tablet Take 20 mg by mouth 2 (two) times daily. 02/28/15   Historical Provider, MD    Physical Exam: Filed Vitals:   11/13/15 2340  BP: 163/52  Pulse: 69  Temp: 97.9 F (36.6 C)  TempSrc: Oral  Resp: 20  Height: 5\' 2"  (1.575 m)  Weight: 49.896 kg (110 lb)  SpO2: 95%      Constitutional: NAD, calm, comfortable Filed Vitals:   11/13/15 2340  BP: 163/52  Pulse: 69  Temp: 97.9 F (36.6 C)  TempSrc: Oral  Resp: 20  Height: 5\' 2"  (1.575 m)  Weight: 49.896 kg (110 lb)  SpO2: 95%   Eyes: PERRL, lids and conjunctivae normal ENMT: Mucous membranes  are moist. Posterior pharynx clear of any exudate or lesions.Normal dentition.  Neck: normal, supple, no masses, no thyromegaly Respiratory: clear to auscultation bilaterally, no wheezing, no crackles. Normal respiratory effort. No accessory muscle use.  Cardiovascular: Regular rate and rhythm, no murmurs / rubs / gallops. No extremity edema. 2+ pedal pulses. No carotid bruits.  Abdomen: no tenderness, no masses palpated. No hepatosplenomegaly. Bowel sounds positive.  Musculoskeletal: no clubbing / cyanosis. No joint deformity upper and lower extremities. Good ROM, no contractures. Normal muscle tone.  Skin: no rashes, lesions, ulcers. No induration Neurologic: CN 2-12 grossly intact. Sensation intact, DTR normal. Strength 5/5 in all 4.  Psychiatric:  Normal judgment and insight. Alert and oriented x 3. Normal mood.    Labs on Admission: I have personally reviewed following labs and imaging studies  CBC: No results for input(s): WBC, NEUTROABS, HGB, HCT, MCV, PLT in the last 168 hours. Basic Metabolic Panel: No results for input(s): NA, K, CL, CO2, GLUCOSE, BUN, CREATININE, CALCIUM, MG, PHOS in the last 168 hours. GFR: CrCl cannot be calculated (Patient has no serum creatinine result on file.). Liver Function Tests: No results for input(s): AST, ALT, ALKPHOS, BILITOT, PROT, ALBUMIN in the last 168 hours. No results for input(s): LIPASE, AMYLASE in the last 168 hours. No results for input(s): AMMONIA in the last 168 hours. Coagulation Profile: No results for input(s): INR, PROTIME in the last 168 hours. Cardiac Enzymes: No results for input(s): CKTOTAL, CKMB, CKMBINDEX, TROPONINI in the last 168 hours. BNP (last 3 results) No results for input(s): PROBNP in the last 8760 hours. HbA1C: No results for input(s): HGBA1C in the last 72 hours. CBG: No results for input(s): GLUCAP in the last 168 hours. Lipid Profile: No results for input(s): CHOL, HDL, LDLCALC, TRIG, CHOLHDL, LDLDIRECT in the last 72 hours. Thyroid Function Tests: No results for input(s): TSH, T4TOTAL, FREET4, T3FREE, THYROIDAB in the last 72 hours. Anemia Panel: No results for input(s): VITAMINB12, FOLATE, FERRITIN, TIBC, IRON, RETICCTPCT in the last 72 hours. Urine analysis: No results found for: COLORURINE, APPEARANCEUR, LABSPEC, PHURINE, GLUCOSEU, HGBUR, BILIRUBINUR, KETONESUR, PROTEINUR, UROBILINOGEN, NITRITE, LEUKOCYTESUR Sepsis Labs: @LABRCNTIP (procalcitonin:4,lacticidven:4) )No results found for this or any previous visit (from the past 240 hour(s)).   Radiological Exams on Admission: No results found.  EKG: Independently reviewed.  Assessment/Plan Principal Problem:   Compression fracture of lumbar vertebra (HCC) Active Problems:   DM2 (diabetes  mellitus, type 2) (HCC)   Essential hypertension   Compression fracture of lumbar vertebrae - either L1 or L2 superior end plate fracture with [id number] height loss (the official CT scan reports L2 in the body of the report but L1 in the impression section of the report).  PT/OT eval and treat  Pain control with norco  Think she will actually be able to get bye without surgical intervention at this point as with just tylenol for pain control she is already ambulating with assistance to bedside commode and pain is reasonably controlled.  HTN - continue home norvasc  DM2 - sensitive scale SSI Q4H   DVT prophylaxis: Lovenox Code Status: Full Family Communication: Family at bedside Consults called: none Admission status: Admit to inpatient   Etta Quill DO [facility] Pager [phone] from 7PM-7AM  If 7AM-7PM, please contact the day physician for the patient [email or web address] Password [facility]  11/14/2015, 12:29 AM

## 2015-11-14 NOTE — Progress Notes (Signed)
Nutrition Brief Note  Patient identified on the Malnutrition Screening Tool (MST) Report  Wt Readings from Last 15 Encounters:  11/13/15 110 lb (49.896 kg)  05/27/15 118 lb (53.524 kg)  05/19/15 118 lb 6.4 oz (53.706 kg)  10/10/13 122 lb (55.339 kg)  04/20/10 120 lb (54.432 kg)  03/30/10 120 lb (54.432 kg)  Wt of 110 was reported. Pt seen in chair today and bed weight could not be obtained. Unsure of true weight  Body mass index is 20.11 kg/(m^2). Patient meets criteria for Healthy ht to wt based on current BMI.   Pt and family state that pt's appetite has slowly been decreasing. From what they report, it sounds largely age related. Husband himself in room reports the same issue.   RD explained how thirst/hunger mechanism decrease with age and it is very normal to lose appetite at their age. RD went over high protein/kcal foods and other wt maintenance strategies. She reports drinking glucerna 1x daily. Will continue while admitted. No poor appetite/wt loss related to her fracture.   Current diet order is Carb Mod, patient is consuming approximately 50% of meals at this time. Labs and medications reviewed.   No further nutrition interventions warranted at this time. If nutrition issues arise, please consult RD.   Burtis Junes RD, LDN Clinical Nutrition Pager: [phone] 11/14/2015 2:28 PM

## 2015-11-15 DIAGNOSIS — E119 Type 2 diabetes mellitus without complications: Secondary | ICD-10-CM | POA: Diagnosis not present

## 2015-11-15 DIAGNOSIS — K219 Gastro-esophageal reflux disease without esophagitis: Secondary | ICD-10-CM | POA: Diagnosis not present

## 2015-11-15 DIAGNOSIS — E785 Hyperlipidemia, unspecified: Secondary | ICD-10-CM | POA: Diagnosis not present

## 2015-11-15 DIAGNOSIS — I10 Essential (primary) hypertension: Secondary | ICD-10-CM | POA: Diagnosis not present

## 2015-11-15 DIAGNOSIS — S32018A Other fracture of first lumbar vertebra, initial encounter for closed fracture: Secondary | ICD-10-CM | POA: Diagnosis not present

## 2015-11-15 DIAGNOSIS — D649 Anemia, unspecified: Secondary | ICD-10-CM | POA: Diagnosis not present

## 2015-11-15 DIAGNOSIS — S32000A Wedge compression fracture of unspecified lumbar vertebra, initial encounter for closed fracture: Secondary | ICD-10-CM | POA: Diagnosis not present

## 2015-11-15 LAB — CBC
HEMATOCRIT: 33.6 % — AB (ref 36.0–46.0)
Hemoglobin: 10.5 g/dL — ABNORMAL LOW (ref 12.0–15.0)
MCH: 28.9 pg (ref 26.0–34.0)
MCHC: 31.3 g/dL (ref 30.0–36.0)
MCV: 92.6 fL (ref 78.0–100.0)
Platelets: 196 10*3/uL (ref 150–400)
RBC: 3.63 MIL/uL — ABNORMAL LOW (ref 3.87–5.11)
RDW: 15 % (ref 11.5–15.5)
WBC: 6.8 10*3/uL (ref 4.0–10.5)

## 2015-11-15 LAB — BASIC METABOLIC PANEL
Anion gap: 7 (ref 5–15)
BUN: 26 mg/dL — ABNORMAL HIGH (ref 6–20)
CALCIUM: 9.2 mg/dL (ref 8.9–10.3)
CO2: 28 mmol/L (ref 22–32)
CREATININE: 1.27 mg/dL — AB (ref 0.44–1.00)
Chloride: 104 mmol/L (ref 101–111)
GFR calc non Af Amer: 36 mL/min — ABNORMAL LOW (ref >60)
GFR, EST AFRICAN AMERICAN: 41 mL/min — AB (ref >60)
GLUCOSE: 112 mg/dL — AB (ref 65–99)
Potassium: 3.9 mmol/L (ref 3.5–5.1)
Sodium: 139 mmol/L (ref 135–145)

## 2015-11-15 LAB — GLUCOSE, CAPILLARY: Glucose-Capillary: 117 mg/dL — ABNORMAL HIGH (ref 65–99)

## 2015-11-15 MED ORDER — AMLODIPINE BESYLATE 2.5 MG PO TABS: 5.0000 mg | ORAL_TABLET | Freq: Every day | ORAL | Status: DC

## 2015-11-15 MED ORDER — HYDROCODONE-ACETAMINOPHEN 5-325 MG PO TABS: 1.0000 | ORAL_TABLET | Freq: Four times a day (QID) | ORAL | Status: DC | PRN

## 2015-11-15 MED ORDER — AMLODIPINE BESYLATE 5 MG PO TABS
2.5000 mg | ORAL_TABLET | Freq: Once | ORAL | Status: AC
  Administered 2015-11-15: 2.5 mg via ORAL

## 2015-11-15 NOTE — Discharge Instructions (Signed)
Vertebral Fracture A vertebral fracture is a break in one of the bones that make up the spine (vertebrae). The vertebrae are stacked on top of each other to form the spinal column. They support the body and protect the spinal cord. The vertebral column has an upper part (cervical spine), a middle part (thoracic spine), and a lower part (lumbar spine). Most vertebral fractures occur in the thoracic spine or lumbar spine. There are three main types of vertebral fractures:  Flexion fracture. This happens when vertebrae collapse. Vertebrae can collapse:  In the front (compression fracture). This type of fracture is common in people who have a condition that causes their bones to be weak and brittle (osteoporosis). The fracture can make a person lose height.  In the front and back (axial burst fracture).  Extension fracture. This happens when an external force pulls apart the vertebrae.  Rotation fracture. This happens when the spine bends extremely in one direction. This type can cause a piece of a vertebra to break off (transverse process fracture) or move out of its normal position (fracture dislocation). This type of fracture has a high risk for spinal cord injury. Vertebral fractures can range from mild to very severe. The most severe types are those that cause the broken bones to move out of place (unstable) and those that injure or press on the spinal cord. CAUSES This condition is usually caused by a forceful injury. This type of injury commonly results from:  Car accidents.  Falling or jumping from a great height.  Collisions in contact sports.  Violent acts, such as an assault or a gunshot wound. RISK FACTORS This injury is more likely to happen to people who:  Have osteoporosis.  Participate in contact sports.  Are in situations that could result in falls or other violent injuries. SYMPTOMS Symptoms of this injury depend on the location and the type of fracture. The most common  symptom is back pain that gets worse with movement. You may also have trouble standing or walking. If a fracture has damaged your spinal cord or is pressing on it, you may also have:  Numbness.  Tingling.  Weakness.  Loss of movement.  Loss of bowel or bladder control. DIAGNOSIS This injury may be diagnosed based on symptoms, medical history, and a physical exam. You may also have imaging tests to confirm the diagnosis. These may include:  Spine X-ray.  CT scan.  MRI. TREATMENT Treatment for this injury depends on the type of fracture. If your fracture is stable and does not affect your spinal cord, it may heal with nonsurgical treatment, such as:  Taking pain medicine.  Wearing a cast or a brace.  Doing physical therapy exercises. If your vertebral fracture is unstable or it affects your spinal cord, you may need surgical treatment, such as:  Laminectomy. This procedure involves removing the part of a vertebra that is pushing on the spinal cord (spinal decompression surgery). Bone fragments may also be removed.  Spinal fusion. This procedure is used to stabilize an unstable fracture. Vertebrae may be joined together with a piece of bone from another part of your body (graft) and held in place with rods, plates, or screws.  Vertebroplasty. In this procedure, bone cement is used to rebuild collapsed vertebrae. HOME CARE INSTRUCTIONS General Instructions  Take medicines only as directed by your health care provider.  Do not drive or operate heavy machinery while taking pain medicine.  If directed, apply ice to the injured area:  Put  ice in a plastic bag.  Place a towel between your skin and the bag.  Leave the ice on for 30 minutes every two hours at first. Then apply the ice as needed.  Wear your neck brace or back brace as directed by your health care provider.  Do not drink alcohol. Alcohol can interfere with your treatment.  Keep all follow-up visits as directed  by your health care provider. This is important. It can help to prevent permanent injury, disability, and long-lasting (chronic) pain. Activity  Stay in bed (on bed rest) only as directed by your health care provider. Being on bed rest for too long can make your condition worse.  Return to your normal activities as directed by your health care provider. Ask what activities are safe for you.  Do exercises to improve motion and strength in your back (physical therapy), as recommended by your health care provider.   Exercise regularly as directed by your health care provider. SEEK MEDICAL CARE IF:  You have a fever.  You develop a cough that makes your pain worse.  Your pain medicine is not helping.  Your pain does not get better over time.  You cannot return to your normal activities as planned or expected. SEEK IMMEDIATE MEDICAL CARE IF:  Your pain is very bad and it suddenly gets worse.  You are unable to move any body part (paralysis) that is below the level of your injury.  You have numbness, tingling, or weakness in any body part that is below the level of your injury.  You cannot control your bladder or bowels.   This information is not intended to replace advice given to you by your health care provider. Make sure you discuss any questions you have with your health care provider.   Document Released: 07/21/2004 Document Revised: 10/28/2014 Document Reviewed: 06/18/2014 Elsevier Interactive Patient Education [employer].

## 2015-11-15 NOTE — Care Management Obs Status (Signed)
[facility] NOTIFICATION   Patient Details  Name: Brandi Mccoy MRN: [id number] Date of Birth: [birth year]   Medicare Observation Status Notification Given:  Yes    Guido Sander, RN 11/15/2015, 12:14 PM

## 2015-11-15 NOTE — Evaluation (Signed)
Occupational Therapy Evaluation Patient Details Name: Brandi Mccoy MRN: [id number] DOB: July 17, 1921 Today's Date: 11/15/2015    History of Present Illness Patient is a 82 yo female admitted 11/13/15 following a fall while on vacation.  Patient with severe back pain.  Found to have lumbar compression fracture--treating conservatively.    PMH:  DM, HTN, osteoporosis, arthritis.   Clinical Impression   This 82 yo female admitted with above presents to acute OT with all education completed with pt and her daughter. No further OT needs, we will sign off.    Follow Up Recommendations  No OT follow up    Equipment Recommendations  Other (comment) (gave pt and daughter resources on where to find AE)       Precautions / Restrictions Precautions Precautions: Fall Precaution Comments: Recommended that she try and follow back precautions to not aggrevate her back and heal quicker Restrictions Weight Bearing Restrictions: No      Mobility Bed Mobility Overal bed mobility: Needs Assistance             General bed mobility comments: S for technique to help her back  Transfers Overall transfer level: Needs assistance Equipment used: Rolling walker (2 wheeled) Transfers: Sit to/from Stand Sit to Stand: Supervision         General transfer comment: VCs for safe hand placement    Balance Overall balance assessment: Needs assistance Sitting-balance support: No upper extremity supported Sitting balance-Leahy Scale: Good     Standing balance support: Bilateral upper extremity supported Standing balance-Leahy Scale: Poor Standing balance comment: Feels more steady/sturdy on her feet with use of RW                            ADL Overall ADL's : Needs assistance/impaired                                       General ADL Comments: Overall at a S level. Educated pt and daughter on the most efficient sequence of getting dressed, recommend a reacher,  recommended a tub seat with back--gave them resources where to find these for purchase. Back precaution handout given               Pertinent Vitals/Pain Pain Assessment: 0-10 Pain Score: 3  Pain Location: low back Pain Descriptors / Indicators: Sore Pain Intervention(s): Monitored during session;Repositioned (went over movements she needs to try and avoid to help her back heal quicker)     Hand Dominance Right   Extremity/Trunk Assessment Upper Extremity Assessment Upper Extremity Assessment: Overall WFL for tasks assessed           Communication Communication Communication: No difficulties   Cognition Arousal/Alertness: Awake/alert Behavior During Therapy: WFL for tasks assessed/performed Overall Cognitive Status: Within Functional Limits for tasks assessed                                Home Living Family/patient expects to be discharged to:: Private residence Living Arrangements: Spouse/significant other (He is 99 and in therapy at OP for falls and neuropathy) Available Help at Discharge: Family;Available 24 hours/day Type of Home: House Home Access: Ramped entrance     Home Layout: One level     Bathroom Shower/Tub: Tub/shower unit;Curtain (what pt normally uses, husband has a walk in shower) Shower/tub  characteristics: Curtain Biochemist, clinical: Standard     Home Equipment: Environmental consultant - 2 wheels;Walker - 4 wheels;Cane - single point;Hand held shower head;Grab bars - tub/shower          Prior Functioning/Environment Level of Independence: Independent        Comments: Very active pta; was in [facility] on vacation when she fell    OT Diagnosis: Generalized weakness;Acute pain         OT Goals(Current goals can be found in the care plan section) Acute Rehab OT Goals Patient Stated Goal: home today  OT Frequency:                End of Session Equipment Utilized During Treatment: Rolling walker Nurse Communication:  (Pt ready from  therapy standpoint to D/C)  Activity Tolerance: Patient tolerated treatment well Patient left: in chair;with family/visitor present   Time: 1200-1254 OT Time Calculation (min): 54 min Charges:  OT General Charges $OT Visit: 1 Procedure OT Evaluation $OT Eval Moderate Complexity: 1 Procedure OT Treatments $Self Care/Home Management : 38-52 mins G-Codes: OT G-codes **NOT FOR INPATIENT CLASS** Functional Assessment Tool Used: Clinical obseravtion Functional Limitation: Self care Self Care Current Status [id number]): At least 1 percent but less than 20 percent impaired, limited or restricted Self Care Goal Status [id number]): At least 1 percent but less than 20 percent impaired, limited or restricted Self Care Discharge Status [phone]): At least 1 percent but less than 20 percent impaired, limited or restricted  Almon Register [id number] 11/15/2015, 1:04 PM

## 2015-11-15 NOTE — Progress Notes (Signed)
CM spoke to patient and family at the bedside. CM explained obs letter in depth and patient verbalized understanding. Copy signed and given to patient. Patient states that she has no home equipment needs nor does she feel any HH services are necessary. CM remains available should additional needs arise.

## 2015-11-15 NOTE — Discharge Summary (Signed)
[facility]  Physician Discharge Summary   Patient ID: Brandi Mccoy MRN: [id number] DOB/AGE: 80-Mar-1923 80 y.o.  Admit date: 11/13/2015 Discharge date: 11/15/2015  PCP: Jani Gravel, MD  DISCHARGE DIAGNOSES:  Principal Problem:   Compression fracture of lumbar vertebra ([place]) Active Problems:   DM2 (diabetes mellitus, type 2) ([place])   Essential hypertension   L1 vertebral fracture ([facility])   RECOMMENDATIONS FOR OUTPATIENT FOLLOW UP: 1. Patient instructed to follow-up with Dr. Joya Salm 2. Needs better blood pressure control. Emphasized importance of being compliant with her medications.  DISCHARGE CONDITION: fair  Diet recommendation: Modified carbohydrate  Filed Weights   11/13/15 2340  Weight: 49.896 kg (110 lb)    INITIAL HISTORY: 82 year old Caucasian female with a past medical history of type 2 diabetes, hypertension, sustained a mechanical fall while she was near [facility] in [place]. She developed severe back pain after this fall. She went to the local emergency department and was found to have superior endplate fracture of L1. Patient is from [place] and requested transfer to this facility.  Consultations:  Phone discussion with Dr. Joya Salm with neurosurgery   HOSPITAL COURSE:   Compression fracture of L1 vertebrae Patient sustained the compression fracture secondary to a mechanical fall. She was in [place] when this occurred. She was transferred to [facility]. CT scan was done at the outside facility, which showed superior endplate fracture of L1. Symptomatically she improved very quickly. Pain responded to medications. She did not have any neurological deficits. She was seen by physical therapy and was ambulated without any difficulties.  Discussed with Dr. Joya Salm with neurosurgery. He recommends outpatient follow-up. He also mentioned a TLSO brace if needed. However, patient has been managing without any problems. So no brace will be ordered  for now.   History of essential hypertension Blood pressure remains quite elevated. Old notes reviewed. When she had an appointment with her cardiologist last year, her blood pressure was in the 123456 systolic. Apparently patient's husband does not administer medications on a daily basis. Have explained to the patient that she needs to take her medications regularly for optimal effect. Since we do not want a precipitous drop in her blood pressure, I have increased amlodipine to 5 mg. I will not add any other medications at this time. She will need follow-up with her outpatient providers for further management.   History of her type 2 diabetes mellitus Continue to monitor CBGs. Sliding scale coverage.  Mildly elevated BUN and creatinine Likely has chronic kidney disease, unknown stage. Old labs are not available. Can be pursued further as outpatient.  Normocytic anemia No baseline hemoglobin available in our system. No evidence for overt bleeding. Hemoglobin is stable  Overall improved. Patient wishes to go home today. She has ambulated and tolerated physical therapy evaluation. Okay for discharge home with family.   PERTINENT LABS:  The results of significant diagnostics from this hospitalization (including imaging, microbiology, ancillary and laboratory) are listed below for reference.      Labs: Basic Metabolic Panel:  Recent Labs Lab 11/14/15 0357 11/15/15 0344  NA 141 139  K 4.2 3.9  CL 103 104  CO2 29 28  GLUCOSE 176* 112*  BUN 25* 26*  CREATININE 1.25* 1.27*  CALCIUM 9.4 9.2   CBC:  Recent Labs Lab 11/14/15 0357 11/15/15 0344  WBC 8.4 6.8  HGB 10.5* 10.5*  HCT 34.6* 33.6*  MCV 92.3 92.6  PLT 213 196   CBG:  Recent Labs Lab 11/14/15 0604 11/14/15 1129  11/14/15 1612 11/14/15 2250 11/15/15 0558  GLUCAP 133* 114* 107* 153* 117*     IMAGING STUDIES No results found.  DISCHARGE EXAMINATION: Filed Vitals:   11/14/15 1936 11/14/15 2100 11/15/15 0334  11/15/15 1034  BP: 190/69 175/61 171/71 178/55  Pulse: 74  71   Temp: 98.1 F (36.7 C)  97.6 F (36.4 C)   TempSrc: Oral  Oral   Resp: 18  18   Height:      Weight:      SpO2: 98%  97%    General appearance: alert, cooperative, appears stated age and no distress Resp: clear to auscultation bilaterally Cardio: regular rate and rhythm, S1, S2 normal, no murmur, click, rub or gallop GI: soft, non-tender; bowel sounds normal; no masses,  no organomegaly Extremities: extremities normal, atraumatic, no cyanosis or edema  DISPOSITION: Home with family  Discharge Instructions    Call MD for:  difficulty breathing, headache or visual disturbances    Complete by:  As directed      Call MD for:  extreme fatigue    Complete by:  As directed      Call MD for:  persistant dizziness or light-headedness    Complete by:  As directed      Call MD for:  persistant nausea and vomiting    Complete by:  As directed      Call MD for:  severe uncontrolled pain    Complete by:  As directed      Call MD for:  temperature >100.4    Complete by:  As directed      Diet - low sodium heart healthy    Complete by:  As directed      Discharge instructions    Complete by:  As directed   Please follow up with your PCP for high BP. Please call Dr. Harley Hallmark office tomorrow to schedule follow up for the vertebral fracture.  You were cared for by a hospitalist during your hospital stay. If you have any questions about your discharge medications or the care you received while you were in the hospital after you are discharged, you can call the unit and asked to speak with the hospitalist on call if the hospitalist that took care of you is not available. Once you are discharged, your primary care physician will handle any further medical issues. Please note that NO REFILLS for any discharge medications will be authorized once you are discharged, as it is imperative that you return to your primary care physician (or  establish a relationship with a primary care physician if you do not have one) for your aftercare needs so that they can reassess your need for medications and monitor your lab values. If you do not have a primary care physician, you can call [phone] for a physician referral.     Increase activity slowly    Complete by:  As directed            ALLERGIES: No Known Allergies   Discharge Medication List as of 11/15/2015 11:41 AM    START taking these medications   Details  HYDROcodone-acetaminophen (NORCO/VICODIN) 5-325 MG tablet Take 1 tablet by mouth every 6 (six) hours as needed for severe pain., Starting 11/15/2015, Until Discontinued, Print      CONTINUE these medications which have CHANGED   Details  amLODipine (NORVASC) 2.5 MG tablet Take 2 tablets (5 mg total) by mouth daily., Starting 11/15/2015, Until Discontinued, Print      CONTINUE these  medications which have NOT CHANGED   Details  eszopiclone (LUNESTA) 2 MG TABS tablet Take 2 mg by mouth at bedtime as needed for sleep. , Starting 03/05/2015, Until Discontinued, Historical Med    chlorhexidine (PERIDEX) 0.12 % solution 30 mLs by Mouth Rinse route 2 (two) times daily. Swish and spit, Starting 10/27/2015, Until Discontinued, Historical Med    CLINPRO 5000 1.1 % PSTE Apply 1 application topically at bedtime. Use at bedtime instead of regular toothpaste (brush 2 minutes then spit out. Do not rinse), Starting 10/27/2015, Until Discontinued, Historical Med    Cyanocobalamin (VITAMIN B-12 PO) Take 1 tablet by mouth daily., Until Discontinued, Historical Med    ferrous sulfate 325 (65 FE) MG tablet Take 325 mg by mouth daily., Starting 11/04/2015, Until Discontinued, Historical Med    Multiple Vitamin (MULTIVITAMIN WITH MINERALS) TABS tablet Take 1 tablet by mouth daily., Until Discontinued, Historical Med    pioglitazone (ACTOS) 30 MG tablet Take 30 mg by mouth daily., Starting 11/04/2015, Until Discontinued, Historical Med        STOP taking these medications     carvedilol (COREG) 3.125 MG tablet        Follow-up Information    Follow up with Floyce Stakes, MD. Schedule an appointment as soon as possible for a visit in 2 weeks.   Specialty:  Neurosurgery   Why:  for further management of the lumbar fracture   Contact information:   [address] [place] [address] [phone]       Follow up with Jani Gravel, MD. Schedule an appointment as soon as possible for a visit in 1 week.   Specialty:  Internal Medicine   Why:  post hospitalization follow up and BP management   Contact information:   [address] [place] [address] [phone]       TOTAL DISCHARGE TIME: 33 minutes  [facility] Pager [phone]  11/15/2015, 2:00 PM

## 2015-11-15 NOTE — Progress Notes (Addendum)
Pt/family given discharge instructions, medication lists, follow up appointments, and when to call the doctor.  Pt/family verbalizes understanding. Patient and daughter given information concerning the importance of taking medications as prescribed. Husband tends to make modifications to amount and frequency.  He is worried about dependency.  I explained importance of following up with Dr. Maudie Mercury concerning blood pressure. Daughter wanted better understanding of blood pressure medications and bp control. Daughter states she will be more involved in medication administration and follow up. Payton Emerald, RN  17:31 11/15/15 Patient's daughter called to clarify letter received from case management concerning observation status. Daughter understands status in observation and not inpatient.  Patient will follow up with medicare and supplemental insurance. Payton Emerald, RN  12/01/15 14:45 Patient's daughter Juliene Pina called to verify that Dr. Joya Salm had all the information that he needed for appointment on 12/03/15.  Called Dr. Harley Hallmark office and let them know that patient no longer had CD from [facility] in [place]. Noted that notes are in Epic to follow up.  Family did call [facility] to see about sending CD and were told that there was no need for a copy. Payton Emerald, RN

## 2015-12-03 DIAGNOSIS — M549 Dorsalgia, unspecified: Secondary | ICD-10-CM | POA: Diagnosis not present

## 2015-12-03 DIAGNOSIS — I10 Essential (primary) hypertension: Secondary | ICD-10-CM | POA: Diagnosis not present

## 2015-12-03 DIAGNOSIS — S32001A Stable burst fracture of unspecified lumbar vertebra, initial encounter for closed fracture: Secondary | ICD-10-CM | POA: Diagnosis not present

## 2016-01-20 DIAGNOSIS — Z85828 Personal history of other malignant neoplasm of skin: Secondary | ICD-10-CM | POA: Diagnosis not present

## 2016-01-20 DIAGNOSIS — C44629 Squamous cell carcinoma of skin of left upper limb, including shoulder: Secondary | ICD-10-CM | POA: Diagnosis not present

## 2016-02-01 DIAGNOSIS — Z79899 Other long term (current) drug therapy: Secondary | ICD-10-CM | POA: Diagnosis not present

## 2016-02-01 DIAGNOSIS — E118 Type 2 diabetes mellitus with unspecified complications: Secondary | ICD-10-CM | POA: Diagnosis not present

## 2016-02-01 DIAGNOSIS — D649 Anemia, unspecified: Secondary | ICD-10-CM | POA: Diagnosis not present

## 2016-02-04 DIAGNOSIS — I10 Essential (primary) hypertension: Secondary | ICD-10-CM | POA: Diagnosis not present

## 2016-02-04 DIAGNOSIS — E119 Type 2 diabetes mellitus without complications: Secondary | ICD-10-CM | POA: Diagnosis not present

## 2016-02-04 DIAGNOSIS — F419 Anxiety disorder, unspecified: Secondary | ICD-10-CM | POA: Diagnosis not present

## 2016-02-04 DIAGNOSIS — D649 Anemia, unspecified: Secondary | ICD-10-CM | POA: Diagnosis not present

## 2016-05-09 DIAGNOSIS — K1230 Oral mucositis (ulcerative), unspecified: Secondary | ICD-10-CM | POA: Diagnosis not present

## 2016-05-18 DIAGNOSIS — Z23 Encounter for immunization: Secondary | ICD-10-CM | POA: Diagnosis not present

## 2016-06-02 DIAGNOSIS — I10 Essential (primary) hypertension: Secondary | ICD-10-CM | POA: Diagnosis not present

## 2016-06-02 DIAGNOSIS — D649 Anemia, unspecified: Secondary | ICD-10-CM | POA: Diagnosis not present

## 2016-06-02 DIAGNOSIS — E119 Type 2 diabetes mellitus without complications: Secondary | ICD-10-CM | POA: Diagnosis not present

## 2016-07-08 DIAGNOSIS — Z780 Asymptomatic menopausal state: Secondary | ICD-10-CM | POA: Diagnosis not present

## 2016-07-08 DIAGNOSIS — D649 Anemia, unspecified: Secondary | ICD-10-CM | POA: Diagnosis not present

## 2016-07-08 DIAGNOSIS — I10 Essential (primary) hypertension: Secondary | ICD-10-CM | POA: Diagnosis not present

## 2016-07-08 DIAGNOSIS — E119 Type 2 diabetes mellitus without complications: Secondary | ICD-10-CM | POA: Diagnosis not present

## 2016-07-08 DIAGNOSIS — Z0000 Encounter for general adult medical examination without abnormal findings: Secondary | ICD-10-CM | POA: Diagnosis not present

## 2016-07-18 DIAGNOSIS — K1230 Oral mucositis (ulcerative), unspecified: Secondary | ICD-10-CM | POA: Diagnosis not present

## 2016-07-18 IMAGING — NM NM MISC PROCEDURE
6 series · 36 of 36 positions shown · non-contrast
Comparison: none

[Series 1: wbr_r-proj_st wbr rest · 6.40mm/px · 6 of 64 frames shown]
[frame 6/64]
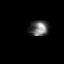
[frame 16/64]
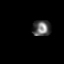
[frame 27/64]
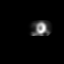
[frame 38/64]
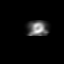
[frame 48/64]
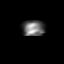
[frame 59/64]
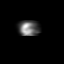

[Series 1: wbr rest · 6.40mm/px · 6 of 64 frames shown]
[frame 6/64]
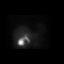
[frame 16/64]
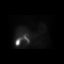
[frame 27/64]
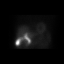
[frame 38/64]
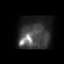
[frame 48/64]
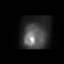
[frame 59/64]
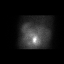

[Series 2: wbr_s-proj_st wbr stress-gsp · 6.40mm/px · 6 of 512 frames shown]
[frame 43/512]
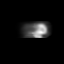
[frame 128/512]
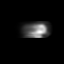
[frame 214/512]
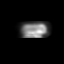
[frame 299/512]
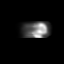
[frame 384/512]
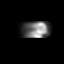
[frame 470/512]
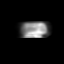

[Series 2: wbr stress-gsp · 6.40mm/px · 6 of 512 frames shown]
[frame 43/512]
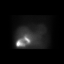
[frame 128/512]
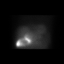
[frame 214/512]
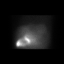
[frame 299/512]
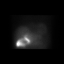
[frame 384/512]
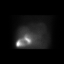
[frame 470/512]
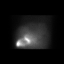

[Series 3: wbr stress-sum-em · 6.40mm/px · 6 of 64 frames shown]
[frame 6/64]
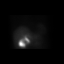
[frame 16/64]
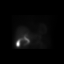
[frame 27/64]
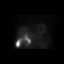
[frame 38/64]
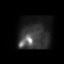
[frame 48/64]
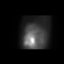
[frame 59/64]
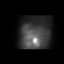

[Series 3: wbr_s-proj_st wbr stress-sum-em · 6.40mm/px · 6 of 64 frames shown]
[frame 6/64]
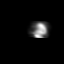
[frame 16/64]
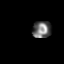
[frame 27/64]
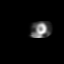
[frame 38/64]
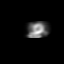
[frame 48/64]
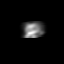
[frame 59/64]
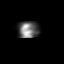

[36 of 36 positions shown; findings below may reference images not displayed]

Canned report from images found in remote index.

Refer to host system for actual result text.

## 2016-08-15 DIAGNOSIS — K1230 Oral mucositis (ulcerative), unspecified: Secondary | ICD-10-CM | POA: Diagnosis not present

## 2016-10-03 ENCOUNTER — Other Ambulatory Visit: Payer: Self-pay | Admitting: Oral Surgery

## 2016-10-03 DIAGNOSIS — K140 Glossitis: Secondary | ICD-10-CM | POA: Diagnosis not present

## 2016-10-03 DIAGNOSIS — L430 Hypertrophic lichen planus: Secondary | ICD-10-CM | POA: Diagnosis not present

## 2016-12-29 DIAGNOSIS — M810 Age-related osteoporosis without current pathological fracture: Secondary | ICD-10-CM | POA: Diagnosis not present

## 2016-12-29 DIAGNOSIS — D649 Anemia, unspecified: Secondary | ICD-10-CM | POA: Diagnosis not present

## 2016-12-29 DIAGNOSIS — I10 Essential (primary) hypertension: Secondary | ICD-10-CM | POA: Diagnosis not present

## 2016-12-29 DIAGNOSIS — Z79899 Other long term (current) drug therapy: Secondary | ICD-10-CM | POA: Diagnosis not present

## 2016-12-29 DIAGNOSIS — E119 Type 2 diabetes mellitus without complications: Secondary | ICD-10-CM | POA: Diagnosis not present

## 2016-12-29 DIAGNOSIS — E118 Type 2 diabetes mellitus with unspecified complications: Secondary | ICD-10-CM | POA: Diagnosis not present

## 2016-12-29 DIAGNOSIS — Z23 Encounter for immunization: Secondary | ICD-10-CM | POA: Diagnosis not present

## 2016-12-29 DIAGNOSIS — Z0000 Encounter for general adult medical examination without abnormal findings: Secondary | ICD-10-CM | POA: Diagnosis not present

## 2017-01-05 DIAGNOSIS — E119 Type 2 diabetes mellitus without complications: Secondary | ICD-10-CM | POA: Diagnosis not present

## 2017-01-05 DIAGNOSIS — Z0000 Encounter for general adult medical examination without abnormal findings: Secondary | ICD-10-CM | POA: Diagnosis not present

## 2017-01-05 DIAGNOSIS — I10 Essential (primary) hypertension: Secondary | ICD-10-CM | POA: Diagnosis not present

## 2017-03-24 DIAGNOSIS — Z23 Encounter for immunization: Secondary | ICD-10-CM | POA: Diagnosis not present

## 2017-07-04 DIAGNOSIS — I10 Essential (primary) hypertension: Secondary | ICD-10-CM | POA: Diagnosis not present

## 2017-07-04 DIAGNOSIS — E118 Type 2 diabetes mellitus with unspecified complications: Secondary | ICD-10-CM | POA: Diagnosis not present

## 2017-07-11 DIAGNOSIS — E7800 Pure hypercholesterolemia, unspecified: Secondary | ICD-10-CM | POA: Diagnosis not present

## 2017-07-11 DIAGNOSIS — I10 Essential (primary) hypertension: Secondary | ICD-10-CM | POA: Diagnosis not present

## 2017-07-11 DIAGNOSIS — Z0000 Encounter for general adult medical examination without abnormal findings: Secondary | ICD-10-CM | POA: Diagnosis not present

## 2017-07-11 DIAGNOSIS — E119 Type 2 diabetes mellitus without complications: Secondary | ICD-10-CM | POA: Diagnosis not present

## 2017-11-15 DIAGNOSIS — I10 Essential (primary) hypertension: Secondary | ICD-10-CM | POA: Diagnosis not present

## 2017-11-15 DIAGNOSIS — S81812D Laceration without foreign body, left lower leg, subsequent encounter: Secondary | ICD-10-CM | POA: Diagnosis not present

## 2017-11-15 DIAGNOSIS — E119 Type 2 diabetes mellitus without complications: Secondary | ICD-10-CM | POA: Diagnosis not present

## 2017-11-15 DIAGNOSIS — E7800 Pure hypercholesterolemia, unspecified: Secondary | ICD-10-CM | POA: Diagnosis not present

## 2017-12-14 DIAGNOSIS — S41111A Laceration without foreign body of right upper arm, initial encounter: Secondary | ICD-10-CM | POA: Diagnosis not present

## 2017-12-14 DIAGNOSIS — H1033 Unspecified acute conjunctivitis, bilateral: Secondary | ICD-10-CM | POA: Diagnosis not present

## 2017-12-14 DIAGNOSIS — J019 Acute sinusitis, unspecified: Secondary | ICD-10-CM | POA: Diagnosis not present

## 2017-12-14 DIAGNOSIS — S51011A Laceration without foreign body of right elbow, initial encounter: Secondary | ICD-10-CM | POA: Diagnosis not present

## 2018-01-10 DIAGNOSIS — E119 Type 2 diabetes mellitus without complications: Secondary | ICD-10-CM | POA: Diagnosis not present

## 2018-01-10 DIAGNOSIS — N390 Urinary tract infection, site not specified: Secondary | ICD-10-CM | POA: Diagnosis not present

## 2018-01-10 DIAGNOSIS — I10 Essential (primary) hypertension: Secondary | ICD-10-CM | POA: Diagnosis not present

## 2018-01-17 DIAGNOSIS — I10 Essential (primary) hypertension: Secondary | ICD-10-CM | POA: Diagnosis not present

## 2018-01-17 DIAGNOSIS — D61818 Other pancytopenia: Secondary | ICD-10-CM | POA: Diagnosis not present

## 2018-01-17 DIAGNOSIS — R634 Abnormal weight loss: Secondary | ICD-10-CM | POA: Diagnosis not present

## 2018-01-17 DIAGNOSIS — E785 Hyperlipidemia, unspecified: Secondary | ICD-10-CM | POA: Diagnosis not present

## 2018-01-17 DIAGNOSIS — D649 Anemia, unspecified: Secondary | ICD-10-CM | POA: Diagnosis not present

## 2018-01-17 DIAGNOSIS — R011 Cardiac murmur, unspecified: Secondary | ICD-10-CM | POA: Diagnosis not present

## 2018-01-17 DIAGNOSIS — E118 Type 2 diabetes mellitus with unspecified complications: Secondary | ICD-10-CM | POA: Diagnosis not present

## 2018-01-17 DIAGNOSIS — Z0000 Encounter for general adult medical examination without abnormal findings: Secondary | ICD-10-CM | POA: Diagnosis not present

## 2018-01-31 ENCOUNTER — Encounter: Payer: Self-pay | Admitting: Oncology

## 2018-01-31 ENCOUNTER — Telehealth: Payer: Self-pay | Admitting: Oncology

## 2018-01-31 NOTE — Telephone Encounter (Signed)
New referral received from Dr. Maudie Mercury at [facility]. Pt has been scheduled to see Dr. Benay Spice on 8/26 at 2pm, labs at 130pm. Left the pt a voicemail with the appt date and time. Letter mailed and faxed to the referring.

## 2018-02-01 ENCOUNTER — Telehealth: Payer: Self-pay | Admitting: Oncology

## 2018-02-01 NOTE — Telephone Encounter (Signed)
Pt called in to cancel appointment with Dr. Benay Spice on 8/26. She did not want to reschedule for another appointment. [name] from [facility] notified.

## 2018-02-19 ENCOUNTER — Other Ambulatory Visit: Payer: Medicare Other

## 2018-02-19 ENCOUNTER — Ambulatory Visit: Payer: Medicare Other | Admitting: Oncology

## 2018-02-19 NOTE — Progress Notes (Signed)
Cardiology Office Note   Date:  02/21/2018   ID:  Brandi Mccoy, DOB Nov 11, 1921, MRN [id number]  PCP:  Brandi Gravel, MD  Cardiologist:  Dr. Martinique   Chief Complaint  Patient presents with  . Follow-up    heart murmur, denies chest pains, mentions SOB, swelling noted in ankles.     History of Present Illness: Brandi Mccoy is a 84 y.o. female who presents for ongoing assessment and management of hypertension and hyperlipidemia.She has a chronic LBBB,  She was last seen in the office by Brandi Mccoy on 05/19/2015 for cardiac clearance to have hernia repair .She was found to be hypertensive and was placed on coreg 3.125 mg BID. Echo was ordered.    She prefers not to have husband present in the room with her. (Copied for accuracy) Here alone - she did not wish for Brandi Mccoy to be in the room with her today. She says she is just not able to talk with him present. She is quite stressed out - mostly because of him - that is a chronic finding.  She has had a recent history of a mouth infection, a type of Lichen which required several months of treatment, as well as gum disease. This has consumed her time. She has since recovered. She remains active, walking and gardening.  She is being followed by PCP for annual labs and medications. She has some complaints of dyspnea after walking long distances. She simply rests or slows down and breathing status is improved.   Past Medical History:  Diagnosis Date  . Arthritis   . Bilateral inguinal hernia   . Colonic polyp    Adenomatous  . Depression   . Diabetes mellitus type 2 in nonobese (HCC)   . Diverticulosis    Colon  . GERD (gastroesophageal reflux disease)   . H/O: hysterectomy 2003  . Hyperlipidemia   . Hypertension   . Osteoporosis   . Tubular adenoma    Shown on colonoscopy November 2008 and prior colonoscopy in 1998    Past Surgical History:  Procedure Laterality Date  . APPENDECTOMY    . LAPAROTOMY       Current Outpatient  Medications  Medication Sig Dispense Refill  . amLODipine (NORVASC) 5 MG tablet Take 5 mg by mouth daily.  6  . Cyanocobalamin (VITAMIN B-12 PO) Take 1 tablet by mouth daily.    . eszopiclone (LUNESTA) 2 MG TABS tablet Take 2 mg by mouth at bedtime as needed for sleep.     . furosemide (LASIX) 20 MG tablet TAKE 1 TABLET BY MOUTH ONCE A DAY AS NEEDED FOR EDEMA  6  . Multiple Vitamin (MULTIVITAMIN WITH MINERALS) TABS tablet Take 1 tablet by mouth daily.    . pioglitazone (ACTOS) 30 MG tablet Take 30 mg by mouth daily.  1  . pravastatin (PRAVACHOL) 10 MG tablet TAKE 1 TABLET ONCE A DAY FOR CHOLESTEROL (START 1X PER WEEK)  12  . chlorhexidine (PERIDEX) 0.12 % solution 30 mLs by Mouth Rinse route 2 (two) times daily. Swish and spit  1  . CLINPRO 5000 1.1 % PSTE Apply 1 application topically at bedtime. Use at bedtime instead of regular toothpaste (brush 2 minutes then spit out. Do not rinse)  12  . ferrous sulfate 325 (65 FE) MG tablet Take 325 mg by mouth daily.  1  . HYDROcodone-acetaminophen (NORCO/VICODIN) 5-325 MG tablet Take 1 tablet by mouth every 6 (six) hours as needed for severe pain. (Patient not taking:  Reported on 02/21/2018) 15 tablet 0   No current facility-administered medications for this visit.     Allergies:   Patient has no known allergies.    Social History:  The patient  reports that she has never smoked. She has never used smokeless tobacco. She reports that she drinks alcohol. She reports that she does not use drugs.   Family History:  The patient's family history includes Heart disease in her mother.    ROS: All other systems are reviewed and negative. Unless otherwise mentioned in H&P    PHYSICAL EXAM: VS:  BP 132/62 (BP Location: Right Arm, Patient Position: Sitting)   Pulse 61   Ht 5' 2.5" (1.588 m)   Wt 119 lb (54 kg)   BMI 21.42 kg/m  , BMI Body mass index is 21.42 kg/m. GEN: Well nourished, well developed, in no acute distress  HEENT: normal  Neck: no  JVD, carotid bruits, or masses Cardiac: RRR; 2/6 systolic murmurs, rubs, or gallops,no edema  Significant varicosities noted in the lower extremities.  Respiratory:  clear to auscultation bilaterally, normal work of breathing GI: soft, nontender, nondistended, + BS MS: no deformity or atrophy  Skin: warm and dry, no rash Neuro:  Strength and sensation are intact Psych: euthymic mood, full affect   EKG:  SR with LBBB, left axis deviation, rate of 61 bpm.  Recent Labs: No results found for requested labs within last 8760 hours.    Lipid Panel No results found for: CHOL, TRIG, HDL, CHOLHDL, VLDL, LDLCALC, LDLDIRECT    Wt Readings from Last 3 Encounters:  02/21/18 119 lb (54 kg)  11/13/15 110 lb (49.9 kg)  Jun 26, 2015 118 lb (53.5 kg)      Other studies Reviewed: Echocardiogram 06/26/2015 Study Conclusions  - Left ventricle: There was mild focal basal hypertrophy of the   septum. The estimated ejection fraction was 55%. - Aortic valve: Calcified non coronary cusp. Valve area (VTI): 1.74   cm^2. Valve area (Vmax): 1.59 cm^2. Valve area (Vmean): 1.61   cm^2. - Mitral valve: Calcified annulus. There was mild regurgitation. - Left atrium: The atrium was moderately dilated.  ASSESSMENT AND PLAN:  1. Hypertension: BP is well controlled on current regimen. She is without complaints of dizziness or chest pain. No changes in her medical regimen.   2. Aortic Valve and Mitral Valve Calcification: Will not repeat echo at her age as she is basically asymptomatic other than mild dyspnea.   3. Diabetes: Followed by PCP.   Current medicines are reviewed at length with the patient today.    Labs/ tests ordered today include: None   Brandi Mccoy, ANP, AACC   02/21/2018 10:54 AM    [facility] [address] [place], [place] [address] Phone: [phone]; Fax: [phone]

## 2018-02-21 ENCOUNTER — Encounter: Payer: Self-pay | Admitting: Adult Health

## 2018-02-21 ENCOUNTER — Ambulatory Visit (INDEPENDENT_AMBULATORY_CARE_PROVIDER_SITE_OTHER): Payer: Medicare Other | Admitting: Adult Health

## 2018-02-21 VITALS — BP 132/62 | HR 61 | Ht 62.5 in | Wt 119.0 lb

## 2018-02-21 DIAGNOSIS — I358 Other nonrheumatic aortic valve disorders: Secondary | ICD-10-CM

## 2018-02-21 DIAGNOSIS — I10 Essential (primary) hypertension: Secondary | ICD-10-CM | POA: Diagnosis not present

## 2018-02-21 NOTE — Patient Instructions (Signed)
Medication Instructions:  NO CHANGES- Your physician recommends that you continue on your current medications as directed. Please refer to the Current Medication list given to you today.  If you need a refill on your cardiac medications before your next appointment, please call your pharmacy.  Follow-Up: Your physician wants you to follow-up in: 1 YEAR WITH DR Martinique You should receive a reminder letter in the mail two months in advance. If you do not receive a letter, please call our office in December 2020 to schedule your follow-up appointment. PLEASE CALL IF YOU HAVE ANY ISSUES/SYMPTOMS.   Thank you for choosing [facility] at [facility]!!

## 2018-03-03 ENCOUNTER — Encounter (HOSPITAL_COMMUNITY): Payer: Self-pay | Admitting: Emergency Medicine

## 2018-03-03 ENCOUNTER — Ambulatory Visit (HOSPITAL_COMMUNITY)
Admission: EM | Admit: 2018-03-03 | Discharge: 2018-03-03 | Disposition: A | Discharge status: Home / Self Care | Payer: Medicare Other | Attending: Internal Medicine | Admitting: Internal Medicine

## 2018-03-03 ENCOUNTER — Other Ambulatory Visit: Payer: Self-pay

## 2018-03-03 DIAGNOSIS — L03116 Cellulitis of left lower limb: Secondary | ICD-10-CM | POA: Diagnosis not present

## 2018-03-03 MED ORDER — SULFAMETHOXAZOLE-TRIMETHOPRIM 800-160 MG PO TABS: 1.0000 | ORAL_TABLET | Freq: Two times a day (BID) | ORAL | 0 refills | Status: AC

## 2018-03-03 NOTE — ED Provider Notes (Signed)
[place]    CSN: [id number] Arrival date & time: 03/03/18  1032     History   Chief Complaint Chief Complaint  Patient presents with  . Fall    HPI Brandi Mccoy is a 84 y.o. female.   Pt here for LLE pain, erythema and swelling. She had a fall about 1 week ago striking her left shin on a brick. She has a wound mid shin with eschar. She is a diabetic. She is able to ambulate on the leg. Some left foot swelling but she reports this is normal. Denies numbness, tingling or loss of sensation.   ROS per HPI    Fall  This is a new problem. The current episode started more than 1 week ago. The problem has been gradually worsening. Nothing aggravates the symptoms. Nothing relieves the symptoms. She has tried nothing for the symptoms.    Past Medical History:  Diagnosis Date  . Arthritis   . Bilateral inguinal hernia   . Colonic polyp    Adenomatous  . Depression   . Diabetes mellitus type 2 in nonobese (HCC)   . Diverticulosis    Colon  . GERD (gastroesophageal reflux disease)   . H/O: hysterectomy 2003  . Hyperlipidemia   . Hypertension   . Osteoporosis   . Tubular adenoma    Shown on colonoscopy November 2008 and prior colonoscopy in 1998    Patient Active Problem List   Diagnosis Date Noted  . L1 vertebral fracture ([place]) 11/14/2015  . Compression fracture of lumbar vertebra ([place]) 11/13/2015  . Dyspnea 10/10/2013  . Palpitations 10/10/2013  . INGUINAL HERNIA 04/20/2010  . Essential hypertension 03/30/2010  . UNSPECIFIED INFECTION OF KIDNEY 03/30/2010  . Loss of weight 03/30/2010  . DIARRHEA 03/30/2010  . Abdominal pain, other specified site 03/30/2010  . PERSONAL HX COLONIC POLYPS 03/30/2010  . COLONIC POLYPS, ADENOMATOUS 08/21/2007  . DM2 (diabetes mellitus, type 2) ([place]) 08/21/2007  . HYPERLIPIDEMIA 08/21/2007  . DEPRESSION 08/21/2007  . GERD 08/21/2007  . DIVERTICULOSIS, COLON 08/21/2007  . ARTHRITIS 08/21/2007  . OSTEOPOROSIS 08/21/2007      Past Surgical History:  Procedure Laterality Date  . APPENDECTOMY    . LAPAROTOMY      OB History   None      Home Medications    Prior to Admission medications   Medication Sig Start Date End Date Taking? Authorizing Provider  amLODipine (NORVASC) 5 MG tablet Take 5 mg by mouth daily. 02/09/18   [provider]  chlorhexidine (PERIDEX) 0.12 % solution 30 mLs by Mouth Rinse route 2 (two) times daily. Swish and spit 10/27/15   [provider]  CLINPRO 5000 1.1 % PSTE Apply 1 application topically at bedtime. Use at bedtime instead of regular toothpaste (brush 2 minutes then spit out. Do not rinse) 10/27/15   [provider]  Cyanocobalamin (VITAMIN B-12 PO) Take 1 tablet by mouth daily.    [provider]  eszopiclone (LUNESTA) 2 MG TABS tablet Take 2 mg by mouth at bedtime as needed for sleep.  03/05/15   [provider]  ferrous sulfate 325 (65 FE) MG tablet Take 325 mg by mouth daily. 11/04/15   [provider]  furosemide (LASIX) 20 MG tablet TAKE 1 TABLET BY MOUTH ONCE A DAY AS NEEDED FOR EDEMA 01/17/18   [provider]  HYDROcodone-acetaminophen (NORCO/VICODIN) 5-325 MG tablet Take 1 tablet by mouth every 6 (six) hours as needed for severe pain. Patient not taking:  Reported on 02/21/2018 11/15/15   Bonnielee Haff, MD  Multiple Vitamin (MULTIVITAMIN WITH MINERALS) TABS tablet Take 1 tablet by mouth daily.    [provider]  pioglitazone (ACTOS) 30 MG tablet Take 30 mg by mouth daily. 11/04/15   [provider]  pravastatin (PRAVACHOL) 10 MG tablet TAKE 1 TABLET ONCE A DAY FOR CHOLESTEROL (START 1X PER WEEK) 01/17/18   [provider]  sulfamethoxazole-trimethoprim (BACTRIM DS,SEPTRA DS) 800-160 MG tablet Take 1 tablet by mouth 2 (two) times daily for 7 days. 03/03/18 03/10/18  Orvan July, NP    Family History Family History  Problem Relation Age of Onset  . Heart disease Mother     Social  History Social History   Tobacco Use  . Smoking status: Never Smoker  . Smokeless tobacco: Never Used  Substance Use Topics  . Alcohol use: Yes  . Drug use: No     Allergies   Patient has no known allergies.   Review of Systems Review of Systems  Constitutional: Negative for activity change and fever.  Musculoskeletal: Positive for arthralgias.  Skin: Positive for color change and wound.  All other systems reviewed and are negative.    Physical Exam Triage Vital Signs ED Triage Vitals  Enc Vitals Group     BP 03/03/18 1137 (!) 156/52     Pulse Rate 03/03/18 1137 (!) 55     Resp 03/03/18 1137 20     Temp 03/03/18 1137 98.2 F (36.8 C)     Temp Source 03/03/18 1137 Oral     SpO2 03/03/18 1137 100 %     Weight --      Height --      Head Circumference --      Peak Flow --      Pain Score 03/03/18 1135 5     Pain Loc --      Pain Edu? --      Excl. in [place]? --    No data found.  Updated Vital Signs BP (!) 156/52 (BP Location: Left Arm)   Pulse (!) 55   Temp 98.2 F (36.8 C) (Oral)   Resp 20   SpO2 100%   Visual Acuity Right Eye Distance:   Left Eye Distance:   Bilateral Distance:    Right Eye Near:   Left Eye Near:    Bilateral Near:     Physical Exam  Constitutional: She is oriented to person, place, and time. She appears well-developed and well-nourished.  Very pleasant. Non toxic or ill appearing.     HENT:  Head: Normocephalic and atraumatic.  Eyes: Conjunctivae are normal.  Neck: Normal range of motion.  Pulmonary/Chest: Effort normal.  Musculoskeletal: Normal range of motion.  Good ROM with leg.   Neurological: She is alert and oriented to person, place, and time.  Skin: Skin is warm and dry.  See pic below for detail  Nursing note and vitals reviewed.      UC Treatments / Results  Labs (all labs ordered are listed, but only abnormal results are displayed) Labs Reviewed - No data to display  EKG None  Radiology No results  found.  Procedures Procedures (including critical care time)  Medications Ordered in UC Medications - No data to display  Initial Impression / Assessment and Plan / UC Course  I have reviewed the triage vital signs and the nursing notes.  Pertinent labs & imaging results that were available during my care of the patient were reviewed  by me and considered in my medical decision making (see chart for details).     Cellulitis-patient is a diabetic.  Will treat with Bactrim. Instructed to elevate the leg to decrease swelling. Strict precautions on returning if wound is not healing or worsens.. Patient agreeable to plan Final Clinical Impressions(s) / UC Diagnoses   Final diagnoses:  Cellulitis of left lower extremity     Discharge Instructions     It was nice meeting you!!  We will go ahead and treat you with antibiotics. Follow-up if not better in the next couple of days. For worsening symptoms to include more swelling, redness, fever please go to the ER Make sure that you are elevating the leg to help with swelling.     ED Prescriptions    Medication Sig Dispense Auth. Provider   sulfamethoxazole-trimethoprim (BACTRIM DS,SEPTRA DS) 800-160 MG tablet Take 1 tablet by mouth 2 (two) times daily for 7 days. 14 tablet Loura Halt A, NP     Controlled Substance Prescriptions [place] Controlled Substance Registry consulted? Not Applicable   Orvan July, NP 03/04/18 [phone]

## 2018-03-03 NOTE — ED Triage Notes (Addendum)
Patient says a dog knocked her down one week ago.  Fell on brick steps.  Left lower leg is swollen, red, warm top touch, and burning pain, tender to touch  Left foot is bruised and swollen as well.  Left pedal pulse is 2+.  Denies foot pain

## 2018-03-03 NOTE — Discharge Instructions (Addendum)
It was nice meeting you!!  We will go ahead and treat you with antibiotics. Follow-up if not better in the next couple of days. For worsening symptoms to include more swelling, redness, fever please go to the ER Make sure that you are elevating the leg to help with swelling.

## 2018-05-12 DIAGNOSIS — Z23 Encounter for immunization: Secondary | ICD-10-CM | POA: Diagnosis not present

## 2018-08-30 DIAGNOSIS — F418 Other specified anxiety disorders: Secondary | ICD-10-CM | POA: Diagnosis not present

## 2018-08-30 DIAGNOSIS — E119 Type 2 diabetes mellitus without complications: Secondary | ICD-10-CM | POA: Diagnosis not present

## 2018-08-30 DIAGNOSIS — E7800 Pure hypercholesterolemia, unspecified: Secondary | ICD-10-CM | POA: Diagnosis not present

## 2018-08-30 DIAGNOSIS — I10 Essential (primary) hypertension: Secondary | ICD-10-CM | POA: Diagnosis not present

## 2018-08-30 DIAGNOSIS — R011 Cardiac murmur, unspecified: Secondary | ICD-10-CM | POA: Diagnosis not present

## 2018-09-13 ENCOUNTER — Ambulatory Visit: Payer: Medicare Other | Admitting: Cardiology

## 2019-01-22 ENCOUNTER — Other Ambulatory Visit: Payer: Self-pay

## 2019-01-22 ENCOUNTER — Encounter (HOSPITAL_COMMUNITY): Payer: Self-pay | Admitting: Emergency Medicine

## 2019-01-22 ENCOUNTER — Ambulatory Visit (INDEPENDENT_AMBULATORY_CARE_PROVIDER_SITE_OTHER)
Admission: EM | Admit: 2019-01-22 | Discharge: 2019-01-22 | Disposition: A | Discharge status: Home / Self Care | Payer: Medicare Other | Source: Home / Self Care | Attending: Family Medicine | Admitting: Family Medicine

## 2019-01-22 ENCOUNTER — Emergency Department (HOSPITAL_COMMUNITY): Payer: Medicare Other

## 2019-01-22 ENCOUNTER — Emergency Department (HOSPITAL_COMMUNITY)
Admission: EM | Admit: 2019-01-22 | Discharge: 2019-01-22 | Disposition: A | Discharge status: Home / Self Care | Payer: Medicare Other | Attending: Emergency Medicine | Admitting: Emergency Medicine

## 2019-01-22 DIAGNOSIS — Y999 Unspecified external cause status: Secondary | ICD-10-CM | POA: Insufficient documentation

## 2019-01-22 DIAGNOSIS — Z23 Encounter for immunization: Secondary | ICD-10-CM | POA: Diagnosis not present

## 2019-01-22 DIAGNOSIS — Y93H2 Activity, gardening and landscaping: Secondary | ICD-10-CM | POA: Diagnosis not present

## 2019-01-22 DIAGNOSIS — Y92017 Garden or yard in single-family (private) house as the place of occurrence of the external cause: Secondary | ICD-10-CM | POA: Insufficient documentation

## 2019-01-22 DIAGNOSIS — W010XXA Fall on same level from slipping, tripping and stumbling without subsequent striking against object, initial encounter: Secondary | ICD-10-CM | POA: Diagnosis not present

## 2019-01-22 DIAGNOSIS — S0101XA Laceration without foreign body of scalp, initial encounter: Secondary | ICD-10-CM | POA: Diagnosis not present

## 2019-01-22 DIAGNOSIS — S0990XA Unspecified injury of head, initial encounter: Secondary | ICD-10-CM | POA: Diagnosis not present

## 2019-01-22 DIAGNOSIS — I10 Essential (primary) hypertension: Secondary | ICD-10-CM

## 2019-01-22 DIAGNOSIS — S0181XA Laceration without foreign body of other part of head, initial encounter: Secondary | ICD-10-CM | POA: Insufficient documentation

## 2019-01-22 DIAGNOSIS — S199XXA Unspecified injury of neck, initial encounter: Secondary | ICD-10-CM | POA: Diagnosis not present

## 2019-01-22 DIAGNOSIS — E119 Type 2 diabetes mellitus without complications: Secondary | ICD-10-CM | POA: Insufficient documentation

## 2019-01-22 DIAGNOSIS — Z79899 Other long term (current) drug therapy: Secondary | ICD-10-CM | POA: Diagnosis not present

## 2019-01-22 MED ORDER — TETANUS-DIPHTH-ACELL PERTUSSIS 5-2.5-18.5 LF-MCG/0.5 IM SUSP
0.5000 mL | Freq: Once | INTRAMUSCULAR | Status: AC
  Administered 2019-01-22: 0.5 mL via INTRAMUSCULAR
  Filled 2019-01-22: qty 0.5

## 2019-01-22 MED ORDER — LIDOCAINE-EPINEPHRINE (PF) 2 %-1:200000 IJ SOLN
20.0000 mL | Freq: Once | INTRAMUSCULAR | Status: AC
  Administered 2019-01-22: 20 mL
  Filled 2019-01-22: qty 20

## 2019-01-22 NOTE — ED Provider Notes (Signed)
Brandi Mccoy    CSN: [id number] Arrival date & time: 01/22/19  [place]     History   Chief Complaint Chief Complaint  Patient presents with  . Fall  . Head Injury    HPI Brandi Mccoy is a 85 y.o. female.   Patient presents with laceration on her forehead, headache, elevated blood pressure after a fall 2 hours PTA and hit her head on a brick wall.  She denies weakness, chest pain, shortness of breath.   The history is provided by the patient.    Past Medical History:  Diagnosis Date  . Arthritis   . Bilateral inguinal hernia   . Colonic polyp    Adenomatous  . Depression   . Diabetes mellitus type 2 in nonobese (HCC)   . Diverticulosis    Colon  . GERD (gastroesophageal reflux disease)   . H/O: hysterectomy 2003  . Hyperlipidemia   . Hypertension   . Osteoporosis   . Tubular adenoma    Shown on colonoscopy November 2008 and prior colonoscopy in 1998    Patient Active Problem List   Diagnosis Date Noted  . L1 vertebral fracture ([place]) 11/14/2015  . Compression fracture of lumbar vertebra ([place]) 11/13/2015  . Dyspnea 10/10/2013  . Palpitations 10/10/2013  . INGUINAL HERNIA 04/20/2010  . Essential hypertension 03/30/2010  . UNSPECIFIED INFECTION OF KIDNEY 03/30/2010  . Loss of weight 03/30/2010  . DIARRHEA 03/30/2010  . Abdominal pain, other specified site 03/30/2010  . PERSONAL HX COLONIC POLYPS 03/30/2010  . COLONIC POLYPS, ADENOMATOUS 08/21/2007  . DM2 (diabetes mellitus, type 2) ([place]) 08/21/2007  . HYPERLIPIDEMIA 08/21/2007  . DEPRESSION 08/21/2007  . GERD 08/21/2007  . DIVERTICULOSIS, COLON 08/21/2007  . ARTHRITIS 08/21/2007  . OSTEOPOROSIS 08/21/2007    Past Surgical History:  Procedure Laterality Date  . APPENDECTOMY    . LAPAROTOMY      OB History   No obstetric history on file.      Home Medications    Prior to Admission medications   Medication Sig Start Date End Date Taking? Authorizing Provider  amLODipine (NORVASC) 5 MG  tablet Take 5 mg by mouth daily. 02/09/18   [provider]  chlorhexidine (PERIDEX) 0.12 % solution 30 mLs by Mouth Rinse route 2 (two) times daily. Swish and spit 10/27/15   [provider]  CLINPRO 5000 1.1 % PSTE Apply 1 application topically at bedtime. Use at bedtime instead of regular toothpaste (brush 2 minutes then spit out. Do not rinse) 10/27/15   [provider]  Cyanocobalamin (VITAMIN B-12 PO) Take 1 tablet by mouth daily.    [provider]  eszopiclone (LUNESTA) 2 MG TABS tablet Take 2 mg by mouth at bedtime as needed for sleep.  03/05/15   [provider]  ferrous sulfate 325 (65 FE) MG tablet Take 325 mg by mouth daily. 11/04/15   [provider]  furosemide (LASIX) 20 MG tablet TAKE 1 TABLET BY MOUTH ONCE A DAY AS NEEDED FOR EDEMA 01/17/18   [provider]  HYDROcodone-acetaminophen (NORCO/VICODIN) 5-325 MG tablet Take 1 tablet by mouth every 6 (six) hours as needed for severe pain. Patient not taking: Reported on 02/21/2018 11/15/15   Bonnielee Haff, MD  Multiple Vitamin (MULTIVITAMIN WITH MINERALS) TABS tablet Take 1 tablet by mouth daily.    [provider]  pioglitazone (ACTOS) 30 MG tablet Take 30 mg by mouth daily. 11/04/15   [provider]  pravastatin (PRAVACHOL) 10 MG tablet TAKE 1  TABLET ONCE A DAY FOR CHOLESTEROL (START 1X PER WEEK) 01/17/18   [provider]    Family History Family History  Problem Relation Age of Onset  . Heart disease Mother     Social History Social History   Tobacco Use  . Smoking status: Never Smoker  . Smokeless tobacco: Never Used  Substance Use Topics  . Alcohol use: Yes  . Drug use: No     Allergies   Patient has no known allergies.   Review of Systems Review of Systems  Constitutional: Negative for chills and fever.  HENT: Negative for ear pain and sore throat.   Eyes: Negative for pain and visual disturbance.  Respiratory: Negative for  cough and shortness of breath.   Cardiovascular: Negative for chest pain and palpitations.  Gastrointestinal: Negative for abdominal pain and vomiting.  Genitourinary: Negative for dysuria and hematuria.  Musculoskeletal: Negative for arthralgias and back pain.  Skin: Positive for wound. Negative for color change and rash.  Neurological: Positive for headaches. Negative for seizures and syncope.  All other systems reviewed and are negative.    Physical Exam Triage Vital Signs ED Triage Vitals  Enc Vitals Group     BP 01/22/19 1826 (!) 187/70     Pulse Rate 01/22/19 1826 (!) 55     Resp 01/22/19 1826 18     Temp 01/22/19 1826 98.2 F (36.8 C)     Temp src --      SpO2 01/22/19 1826 100 %     Weight --      Height --      Head Circumference --      Peak Flow --      Pain Score 01/22/19 1827 3     Pain Loc --      Pain Edu? --      Excl. in [place]? --    No data found.  Updated Vital Signs BP (!) 187/70   Pulse (!) 55   Temp 98.2 F (36.8 C)   Resp 18   SpO2 100%   Visual Acuity Right Eye Distance:   Left Eye Distance:   Bilateral Distance:    Right Eye Near:   Left Eye Near:    Bilateral Near:     Physical Exam Vitals signs and nursing note reviewed.  Constitutional:      General: She is not in acute distress.    Appearance: She is well-developed.  Eyes:     Conjunctiva/sclera: Conjunctivae normal.  Neck:     Musculoskeletal: Neck supple.  Cardiovascular:     Rate and Rhythm: Normal rate and regular rhythm.     Heart sounds: No murmur.  Pulmonary:     Effort: Pulmonary effort is normal. No respiratory distress.  Abdominal:     Palpations: Abdomen is soft.     Tenderness: There is no abdominal tenderness.  Skin:    General: Skin is warm and dry.     Comments: Laceration on left forehead. No active bleeding.   Neurological:     General: No focal deficit present.     Mental Status: She is alert.      UC Treatments / Results  Labs (all labs ordered  are listed, but only abnormal results are displayed) Labs Reviewed - No data to display  EKG   Radiology No results found.  Procedures Procedures (including critical care time)  Medications Ordered in UC Medications - No data to display  Initial Impression / Assessment and Plan /  UC Course  I have reviewed the triage vital signs and the nursing notes.  Pertinent labs & imaging results that were available during my care of the patient were reviewed by me and considered in my medical decision making (see chart for details).   Head injury s/p fall, laceration, headache, elevated blood pressure.  Patient sent to the emergency department for evaluation.     Final Clinical Impressions(s) / UC Diagnoses   Final diagnoses:  Injury of head, initial encounter  Elevated blood pressure reading in office with diagnosis of hypertension     Discharge Instructions     Go to the ER for evaluation of your head injury with laceration.      ED Prescriptions    None     Controlled Substance Prescriptions [place] Controlled Substance Registry consulted? Not Applicable   Sharion Balloon, NP 01/22/19 [phone]

## 2019-01-22 NOTE — ED Notes (Signed)
Patient sister calling asking for update on patient   [place]

## 2019-01-22 NOTE — ED Triage Notes (Signed)
Pt states she lost her balance and hit her head on a brick wall today. Large laceration to forehead. Pt is AAOX4.

## 2019-01-22 NOTE — ED Provider Notes (Signed)
[place] EMERGENCY DEPARTMENT Provider Note   CSN: [id number] Arrival date & time: 01/22/19  1843     History   Chief Complaint Chief Complaint  Patient presents with   Fall   Head Laceration    HPI Brandi Mccoy is a 85 y.o. female.     Patient presents to the ED with a chief complaint of head injury.  She states that she was working in the yard this afternoon and bent over to pick up a stick, lost her balance, and fell hitting her head.  She did not pass out.  She denies any slurred speech, vision changes, numbness, weakness, or tingling.  Reports mild headache.  Denies any other associated symptoms.  The history is provided by the patient. No language interpreter was used.    Past Medical History:  Diagnosis Date   Arthritis    Bilateral inguinal hernia    Colonic polyp    Adenomatous   Depression    Diabetes mellitus type 2 in nonobese [facility])    Diverticulosis    Colon   GERD (gastroesophageal reflux disease)    H/O: hysterectomy 2003   Hyperlipidemia    Hypertension    Osteoporosis    Tubular adenoma    Shown on colonoscopy November 2008 and prior colonoscopy in 1998    Patient Active Problem List   Diagnosis Date Noted   L1 vertebral fracture ([place]) 11/14/2015   Compression fracture of lumbar vertebra ([place]) 11/13/2015   Dyspnea 10/10/2013   Palpitations 10/10/2013   INGUINAL HERNIA 04/20/2010   Essential hypertension 03/30/2010   UNSPECIFIED INFECTION OF KIDNEY 03/30/2010   Loss of weight 03/30/2010   DIARRHEA 03/30/2010   Abdominal pain, other specified site 03/30/2010   PERSONAL HX COLONIC POLYPS 03/30/2010   COLONIC POLYPS, ADENOMATOUS 08/21/2007   DM2 (diabetes mellitus, type 2) ([place]) 08/21/2007   HYPERLIPIDEMIA 08/21/2007   DEPRESSION 08/21/2007   GERD 08/21/2007   DIVERTICULOSIS, COLON 08/21/2007   ARTHRITIS 08/21/2007   OSTEOPOROSIS 08/21/2007    Past Surgical History:  Procedure  Laterality Date   APPENDECTOMY     LAPAROTOMY       OB History   No obstetric history on file.      Home Medications    Prior to Admission medications   Medication Sig Start Date End Date Taking? Authorizing Provider  amLODipine (NORVASC) 5 MG tablet Take 5 mg by mouth daily. 02/09/18   [provider]  chlorhexidine (PERIDEX) 0.12 % solution 30 mLs by Mouth Rinse route 2 (two) times daily. Swish and spit 10/27/15   [provider]  CLINPRO 5000 1.1 % PSTE Apply 1 application topically at bedtime. Use at bedtime instead of regular toothpaste (brush 2 minutes then spit out. Do not rinse) 10/27/15   [provider]  Cyanocobalamin (VITAMIN B-12 PO) Take 1 tablet by mouth daily.    [provider]  eszopiclone (LUNESTA) 2 MG TABS tablet Take 2 mg by mouth at bedtime as needed for sleep.  03/05/15   [provider]  ferrous sulfate 325 (65 FE) MG tablet Take 325 mg by mouth daily. 11/04/15   [provider]  furosemide (LASIX) 20 MG tablet TAKE 1 TABLET BY MOUTH ONCE A DAY AS NEEDED FOR EDEMA 01/17/18   [provider]  HYDROcodone-acetaminophen (NORCO/VICODIN) 5-325 MG tablet Take 1 tablet by mouth every 6 (six) hours as needed for severe pain. Patient not taking: Reported on 02/21/2018 11/15/15   Bonnielee Haff, MD  Multiple Vitamin (MULTIVITAMIN WITH MINERALS) TABS tablet Take 1 tablet by mouth daily.    [provider]  pioglitazone (ACTOS) 30 MG tablet Take 30 mg by mouth daily. 11/04/15   [provider]  pravastatin (PRAVACHOL) 10 MG tablet TAKE 1 TABLET ONCE A DAY FOR CHOLESTEROL (START 1X PER WEEK) 01/17/18   [provider]    Family History Family History  Problem Relation Age of Onset   Heart disease Mother     Social History Social History   Tobacco Use   Smoking status: Never Smoker   Smokeless tobacco: Never Used  Substance Use Topics   Alcohol use: Yes   Drug use: No      Allergies   Patient has no known allergies.   Review of Systems Review of Systems  All other systems reviewed and are negative.    Physical Exam Updated Vital Signs BP (!) 149/89 (BP Location: Right Arm)    Pulse (!) 55    Temp (!) 97.5 F (36.4 C) (Oral)    Resp 16    Ht 5' 2.5" (1.588 m)    Wt 54.4 kg    SpO2 100%    BMI 21.60 kg/m   Physical Exam Vitals signs and nursing note reviewed.  Constitutional:      General: She is not in acute distress.    Appearance: She is well-developed.  HENT:     Head: Normocephalic and atraumatic.  Eyes:     Conjunctiva/sclera: Conjunctivae normal.  Neck:     Musculoskeletal: Neck supple.  Cardiovascular:     Rate and Rhythm: Normal rate and regular rhythm.     Heart sounds: No murmur.  Pulmonary:     Effort: Pulmonary effort is normal. No respiratory distress.     Breath sounds: Normal breath sounds.  Abdominal:     Palpations: Abdomen is soft.     Tenderness: There is no abdominal tenderness.  Musculoskeletal: Normal range of motion.  Skin:    General: Skin is warm and dry.     Comments: L shaped laceration to left forehead  Neurological:     Mental Status: She is alert and oriented to person, place, and time.  Psychiatric:        Mood and Affect: Mood normal.        Behavior: Behavior normal.        Thought Content: Thought content normal.        Judgment: Judgment normal.      ED Treatments / Results  Labs (all labs ordered are listed, but only abnormal results are displayed) Labs Reviewed - No data to display  EKG None  Radiology Ct Head Wo Contrast  Result Date: 01/22/2019 CLINICAL DATA:  Status post fall with head laceration. EXAM: CT HEAD WITHOUT CONTRAST CT CERVICAL SPINE WITHOUT CONTRAST TECHNIQUE: Multidetector CT imaging of the head and cervical spine was performed following the standard protocol without intravenous contrast. Multiplanar CT image reconstructions of the cervical spine were also  generated. COMPARISON:  None. FINDINGS: CT HEAD FINDINGS Brain: There is low-density with loss of gray-white differentiation in the left sub insula, acute infarct is not excluded. There is no acute hemorrhage, midline shift or hydrocephalus. No focal mass is identified. There is chronic diffuse atrophy. Chronic bilateral periventricular white matter small vessel ischemic change is noted. Vascular: No hyperdense vessel. Skull: Normal. Negative for fracture or focal lesion. Sinuses/Orbits: No acute finding. Other: There is soft tissue swelling with laceration of the left frontal scalp.  CT CERVICAL SPINE FINDINGS Alignment: There is partial straightening of the cervical spine probably due to positional or muscle spasm. Skull base and vertebrae: No acute fracture. No primary bone lesion or focal pathologic process. Soft tissues and spinal canal: No prevertebral fluid or swelling. No visible canal hematoma. Disc levels: Degenerative joint changes with narrowed joint space and osteophyte formation are noted throughout cervical spine. Upper chest: Negative. Other: None. IMPRESSION: Loss of gray-white differentiation in the left sub insula, acute infarct is not excluded. Recommend further evaluation with MRI of brain. No acute hemorrhage. Soft tissue swelling with laceration of the left frontal scalp. No acute fracture or dislocation of cervical spine. Electronically Signed   By: Abelardo Diesel M.D.   On: 01/22/2019 20:53   Ct Cervical Spine Wo Contrast  Result Date: 01/22/2019 CLINICAL DATA:  Status post fall with head laceration. EXAM: CT HEAD WITHOUT CONTRAST CT CERVICAL SPINE WITHOUT CONTRAST TECHNIQUE: Multidetector CT imaging of the head and cervical spine was performed following the standard protocol without intravenous contrast. Multiplanar CT image reconstructions of the cervical spine were also generated. COMPARISON:  None. FINDINGS: CT HEAD FINDINGS Brain: There is low-density with loss of gray-white  differentiation in the left sub insula, acute infarct is not excluded. There is no acute hemorrhage, midline shift or hydrocephalus. No focal mass is identified. There is chronic diffuse atrophy. Chronic bilateral periventricular white matter small vessel ischemic change is noted. Vascular: No hyperdense vessel. Skull: Normal. Negative for fracture or focal lesion. Sinuses/Orbits: No acute finding. Other: There is soft tissue swelling with laceration of the left frontal scalp. CT CERVICAL SPINE FINDINGS Alignment: There is partial straightening of the cervical spine probably due to positional or muscle spasm. Skull base and vertebrae: No acute fracture. No primary bone lesion or focal pathologic process. Soft tissues and spinal canal: No prevertebral fluid or swelling. No visible canal hematoma. Disc levels: Degenerative joint changes with narrowed joint space and osteophyte formation are noted throughout cervical spine. Upper chest: Negative. Other: None. IMPRESSION: Loss of gray-white differentiation in the left sub insula, acute infarct is not excluded. Recommend further evaluation with MRI of brain. No acute hemorrhage. Soft tissue swelling with laceration of the left frontal scalp. No acute fracture or dislocation of cervical spine. Electronically Signed   By: Abelardo Diesel M.D.   On: 01/22/2019 20:53    Procedures Procedures (including critical care time) LACERATION REPAIR Performed by: Montine Circle Authorized by: Montine Circle Consent: Verbal consent obtained. Risks and benefits: risks, benefits and alternatives were discussed Consent given by: patient Patient identity confirmed: provided demographic data Prepped and Draped in normal sterile fashion Wound explored  Laceration Location: forehead  Laceration Length: 3 cm  No Foreign Bodies seen or palpated  Anesthesia: local infiltration  Local anesthetic: lidocaine 1% with epinephrine  Anesthetic total: 4 ml  Irrigation method:  syringe Amount of cleaning: standard  Skin closure: 5-0 vicryl rapide  Number of sutures: 10  Technique: combination of interrupted, mattress, and figure of eight  Patient tolerance: Patient tolerated the procedure well with no immediate complications. Due to a small bleeding vessel, several figure of eight sutures were used, in addition to several horizontal mattress to gain adequate tension to approximate the wound in the delicate skin.  Medications Ordered in ED Medications  lidocaine-EPINEPHrine (XYLOCAINE W/EPI) 2 %-1:200000 (PF) injection 20 mL (has no administration in time range)  Tdap (BOOSTRIX) injection 0.5 mL (has no administration in time range)     Initial Impression / Assessment  and Plan / ED Course  I have reviewed the triage vital signs and the nursing notes.  Pertinent labs & imaging results that were available during my care of the patient were reviewed by me and considered in my medical decision making (see chart for details).        Patient with non-syncopal fall from standing in her yard while picking up a stick.    CT head is notable for possible stroke, MRI is recommended, but patient refuses and states that she will follow-up with her doctor.  She doesn't have any symptoms to indicate stroke.    I discussed this plan with Dr. Alvino Chapel, who agrees that it seems reasonable and she can be discharged with strict return precautions and PCP follow-up.  11:00 PM Again advised that she get an MRI, but patient again declines and says she will follow-up with her doctor.  Final Clinical Impressions(s) / ED Diagnoses   Final diagnoses:  Injury of head, initial encounter  Laceration of forehead, initial encounter    ED Discharge Orders    None       Montine Circle, PA-C 01/22/19 2301    Davonna Belling, MD 01/22/19 2317

## 2019-01-22 NOTE — Discharge Instructions (Addendum)
Go to the ER for evaluation of your head injury with laceration.

## 2019-01-22 NOTE — ED Notes (Signed)
Patient is being discharged from the Urgent [facility] and sent to the Emergency Department via wheelchair by staff. Per Claiborne Billings NP, patient is stable but in need of higher level of care due to head injury. Patient is aware and verbalizes understanding of plan of care.  Vitals:   01/22/19 1826  BP: (!) 187/70  Pulse: (!) 55  Resp: 18  Temp: 98.2 F (36.8 C)  SpO2: 100%

## 2019-01-22 NOTE — ED Notes (Signed)
Patient's husband advised pt cannot wait any longer and that he is going to take her home, pt and husband encouraged to stay and be seen by a provider, however, pts husband insisted that he take patient home. Charge RN made aware and patient moved to hall bed so that provider can see her.

## 2019-01-22 NOTE — ED Triage Notes (Signed)
Pt reports machanical fall today in the yard.  She has a head lac that has controlled bleeding.  Reports no LOC, not on blood thinners, no dizziness nor neuro deficits.

## 2019-01-22 NOTE — ED Notes (Signed)
[phone] Please call when pt gets to room

## 2019-02-13 ENCOUNTER — Ambulatory Visit (INDEPENDENT_AMBULATORY_CARE_PROVIDER_SITE_OTHER): Payer: Medicare Other | Admitting: Adult Health

## 2019-02-13 ENCOUNTER — Encounter: Payer: Self-pay | Admitting: Adult Health

## 2019-02-13 ENCOUNTER — Other Ambulatory Visit: Payer: Self-pay

## 2019-02-13 VITALS — BP 160/62 | HR 62 | Temp 98.1°F | Ht 62.5 in | Wt 118.0 lb

## 2019-02-13 DIAGNOSIS — W19XXXD Unspecified fall, subsequent encounter: Secondary | ICD-10-CM

## 2019-02-13 DIAGNOSIS — I10 Essential (primary) hypertension: Secondary | ICD-10-CM | POA: Diagnosis not present

## 2019-02-13 DIAGNOSIS — Z79899 Other long term (current) drug therapy: Secondary | ICD-10-CM

## 2019-02-13 DIAGNOSIS — R42 Dizziness and giddiness: Secondary | ICD-10-CM | POA: Diagnosis not present

## 2019-02-13 DIAGNOSIS — Z8679 Personal history of other diseases of the circulatory system: Secondary | ICD-10-CM | POA: Diagnosis not present

## 2019-02-13 MED ORDER — AMLODIPINE BESYLATE 5 MG PO TABS: 7.5000 mg | ORAL_TABLET | Freq: Every day | ORAL | 6 refills | Status: DC

## 2019-02-13 NOTE — Patient Instructions (Signed)
Medication Instructions:  INCREASE- Amlodipine 7.5 mg(1 1/2 tablets) by mouth daily  If you need a refill on your cardiac medications before your next appointment, please call your pharmacy.  Labwork: CBC and BMP HERE IN OUR OFFICE AT [facility]  You will NOT need to fast   Take the provided lab slips with you to the lab for your blood draw.   When you have your labs (blood work) drawn today and your tests are completely normal, you will receive your results only by MyChart Message (if you have MyChart) -OR-  A paper copy in the mail.  If you have any lab test that is abnormal or we need to change your treatment, we will call you to review these results.  Testing/Procedures: Your physician has requested that you have an echocardiogram. Echocardiography is a painless test that uses sound waves to create images of your heart. It provides your doctor with information about the size and shape of your heart and how well your heart's chambers and valves are working. This procedure takes approximately one hour. There are no restrictions for this procedure.  Follow-Up: . Your physician recommends that you schedule a follow-up appointment in: 1 Month   At [facility], you and your health needs are our priority.  As part of our continuing mission to provide you with exceptional heart care, we have created designated Provider Care Teams.  These Care Teams include your primary Cardiologist (physician) and Advanced Practice Providers (APPs -  Physician Assistants and Nurse Practitioners) who all work together to provide you with the care you need, when you need it.  Thank you for choosing [facility] at [facility]!!

## 2019-02-13 NOTE — Progress Notes (Signed)
Cardiology Office Note   Date:  02/13/2019   ID:  Brandi Mccoy, DOB 22-Aug-1921, MRN [id number]  PCP:  Jani Gravel, MD  Cardiologist: Dr. Martinique CC: Follow up   History of Present Illness: Brandi Mccoy is a 85 y.o. female who presents for ongoing assessment and management of hypertension, hyperlipidemia, chronic left bundle branch block.  The patient was last seen in the office on 02/21/2018.  She prefers not to have husband present in the room with her. (Copied for accuracy) Here alone - she did not wish for Fransisco Beau to be in the room with her today. She says she is just not able to talk with him present. She is quite stressed out - mostly because of him - that is a chronic finding.  At the time of that last office visit she had some mild dyspnea after walking long distance.  She had no complaints of chest pain or increased fatigue.  No medication changes were made or future testing was planned.  Since being seen, she feel at home while walking in her yard. This was a mechanical fall after bending over to pick up a stick and lost her balance. No loss of consciousness.  She sustained a laceration to her left forehead after hitting her head against a brick wall,  which required suturing.  CT scan of the head did not reveal any acute infarct or intracerebral abnormalities.  She was found to have some soft tissue swelling and laceration of the left frontal scalp.  On arrival to the emergency room she was found to be hypertensive with blood pressures greater than 101 systolic.  Slowly coming down to 187/70.  Since that time her blood pressures been elevated.  She denies chest pain, dizziness, dyspnea, on exertion or blurred vision since episode.   Past Medical History:  Diagnosis Date  . Arthritis   . Bilateral inguinal hernia   . Colonic polyp    Adenomatous  . Depression   . Diabetes mellitus type 2 in nonobese ([facility])   . Diverticulosis    Colon  . GERD (gastroesophageal reflux disease)   .  H/O: hysterectomy 2003  . Hyperlipidemia   . Hypertension   . Osteoporosis   . Tubular adenoma    Shown on colonoscopy November 2008 and prior colonoscopy in 1998    Past Surgical History:  Procedure Laterality Date  . APPENDECTOMY    . LAPAROTOMY       Current Outpatient Medications  Medication Sig Dispense Refill  . amLODipine (NORVASC) 5 MG tablet Take 1.5 tablets (7.5 mg total) by mouth daily. 45 tablet 6  . chlorhexidine (PERIDEX) 0.12 % solution 30 mLs by Mouth Rinse route 2 (two) times daily. Swish and spit  1  . CLINPRO 5000 1.1 % PSTE Apply 1 application topically at bedtime. Use at bedtime instead of regular toothpaste (brush 2 minutes then spit out. Do not rinse)  12  . Cyanocobalamin (VITAMIN B-12 PO) Take 1 tablet by mouth daily.    . eszopiclone (LUNESTA) 2 MG TABS tablet Take 2 mg by mouth at bedtime as needed for sleep.     . ferrous sulfate 325 (65 FE) MG tablet Take 325 mg by mouth daily.  1  . furosemide (LASIX) 20 MG tablet TAKE 1 TABLET BY MOUTH ONCE A DAY AS NEEDED FOR EDEMA  6  . HYDROcodone-acetaminophen (NORCO/VICODIN) 5-325 MG tablet Take 1 tablet by mouth every 6 (six) hours as needed for severe pain. 15 tablet 0  .  Multiple Vitamin (MULTIVITAMIN WITH MINERALS) TABS tablet Take 1 tablet by mouth daily.    . pioglitazone (ACTOS) 30 MG tablet Take 30 mg by mouth daily.  1  . pravastatin (PRAVACHOL) 10 MG tablet TAKE 1 TABLET ONCE A DAY FOR CHOLESTEROL (START 1X PER WEEK)  12   No current facility-administered medications for this visit.     Allergies:   Patient has no known allergies.    Social History:  The patient  reports that she has never smoked. She has never used smokeless tobacco. She reports current alcohol use. She reports that she does not use drugs.   Family History:  The patient's family history includes Heart disease in her mother.    ROS: All other systems are reviewed and negative. Unless otherwise mentioned in H&P    PHYSICAL EXAM:  VS:  Pulse 62   Temp 98.1 F (36.7 C)   Ht 5' 2.5" (1.588 m)   Wt 118 lb (53.5 kg)   SpO2 92%   BMI 21.24 kg/m  , BMI Body mass index is 21.24 kg/m. GEN: Well nourished, well developed, in no acute distress HEENT: Healing laceration to her left forehead. Neck: no JVD, right carotid bruits, or masses Cardiac: RRR; 2/6 systolic murmurs, rubs, or gallops, positive for pitting dependent edema varicosities are prominent respiratory:  Clear to auscultation bilaterally, normal work of breathing GI: soft, nontender, nondistended, + BS MS: no deformity or atrophy Skin: warm and dry, no rash Neuro:  Strength and sensation are intact Psych: euthymic mood, full affect   EKG: Sinus rhythm with first-degree AV block, ventricular rate of 62 bpm with chronic left bundle branch block not new.  Recent Labs: No results found for requested labs within last 8760 hours.    Lipid Panel No results found for: CHOL, TRIG, HDL, CHOLHDL, VLDL, LDLCALC, LDLDIRECT    Wt Readings from Last 3 Encounters:  02/13/19 118 lb (53.5 kg)  01/22/19 120 lb (54.4 kg)  02/21/18 119 lb (54 kg)      Other studies Reviewed: Echocardiogram 12-Jun-2015 Study Conclusions  - Left ventricle: There was mild focal basal hypertrophy of the septum. The estimated ejection fraction was 55%. - Aortic valve: Calcified non coronary cusp. Valve area (VTI): 1.74 cm^2. Valve area (Vmax): 1.59 cm^2. Valve area (Vmean): 1.61 cm^2. - Mitral valve: Calcified annulus. There was mild regurgitation. - Left atrium: The atrium was moderately dilated.   ASSESSMENT AND PLAN:  1.  Hypertension: Blood pressure has remained elevated since falling and hitting her head requiring ER evaluation and suturing to her left frontal scalp.  Today her blood pressure is 160/62.  She is currently on amlodipine 5 mg daily.  I will increase it only slightly to 7.5 mg daily.  I will check some labs to make sure that she is not anemic or that her  kidney function is not impaired.  She has been taking Lasix 20 mg daily for chronic lower extremity edema.  I am been to have her stop this and just use it as needed.  I have advised her to wear support hose which can be found locally or online.  Her daughter is with her and they verbalized understanding.  2.  Hyperlipidemia: She will continue pravastatin 10 mg daily.  She will need follow-up fasting lipids which can be ordered on next appointment.  3.  Fall: She will need to have further work-up if she continues to do so, or if she becomes very dizzy or lightheaded with position  change.  Her primary care physician is currently out of the office and she may need to be referred to another PCP for management versus neurology if necessary.  Current medicines are reviewed at length with the patient today.    Labs/ tests ordered today include: BMET, CBC.  Echocardiogram Phill Myron. [place] Pugh, ANP, AACC   02/13/2019 4:40 PM    [facility] [address] [address] Office [phone] Fax [phone]

## 2019-02-14 LAB — BASIC METABOLIC PANEL
BUN/Creatinine Ratio: 22 (ref 12–28)
BUN: 33 mg/dL (ref 10–36)
CO2: 27 mmol/L (ref 20–29)
Calcium: 9.6 mg/dL (ref 8.7–10.3)
Chloride: 100 mmol/L (ref 96–106)
Creatinine, Ser: 1.49 mg/dL — ABNORMAL HIGH (ref 0.57–1.00)
GFR calc Af Amer: 34 mL/min/{1.73_m2} — ABNORMAL LOW (ref >59)
GFR calc non Af Amer: 30 mL/min/{1.73_m2} — ABNORMAL LOW (ref >59)
Glucose: 157 mg/dL — ABNORMAL HIGH (ref 65–99)
Potassium: 4.7 mmol/L (ref 3.5–5.2)
Sodium: 139 mmol/L (ref 134–144)

## 2019-02-14 LAB — CBC
Hematocrit: 33.1 % — ABNORMAL LOW (ref 34.0–46.6)
Hemoglobin: 11.1 g/dL (ref 11.1–15.9)
MCH: 30.9 pg (ref 26.6–33.0)
MCHC: 33.5 g/dL (ref 31.5–35.7)
MCV: 92 fL (ref 79–97)
Platelets: 168 10*3/uL (ref 150–450)
RBC: 3.59 x10E6/uL — ABNORMAL LOW (ref 3.77–5.28)
RDW: 12.7 % (ref 11.7–15.4)
WBC: 5.4 10*3/uL (ref 3.4–10.8)

## 2019-02-25 ENCOUNTER — Ambulatory Visit (HOSPITAL_COMMUNITY): Payer: Medicare Other | Attending: Cardiovascular Disease

## 2019-02-25 ENCOUNTER — Other Ambulatory Visit: Payer: Self-pay

## 2019-02-25 DIAGNOSIS — I10 Essential (primary) hypertension: Secondary | ICD-10-CM | POA: Diagnosis not present

## 2019-02-25 DIAGNOSIS — R42 Dizziness and giddiness: Secondary | ICD-10-CM

## 2019-03-17 NOTE — Progress Notes (Deleted)
{Choose 1 Note Type (Telehealth Visit or Telephone Visit):[phone]}   Date:  03/17/2019   ID:  [name], DOB [birth year], MRN [id number]  {Patient Location:[phone]::"Home"} {Provider Location:[phone]::"Home"}  PCP:  Brandi Gravel, MD  Cardiologist:  Dr.Jordan  Electrophysiologist:  None   Evaluation Performed:  {Choose Visit Type:[phone]::"Follow-Up Visit"}  Chief Complaint:  ***  History of Present Illness:    Brandi Mccoy is a 85 y.o. female with we are following for ongoing assessment and management of hyperlipidemia, chronic left bundle branch block, and hypertension.  On the last office visit on 02/13/2019 the patient was complaining of mild dyspnea after walking a long distance, but no complaints of chest pain or increased fatigue.  She had fallen at home while walking in her yard.  This was a mechanical fall after bending over to pick up a stick and she lost her balance.  No loss of consciousness however she did sustain a laceration to her left forehead after hitting her head against a brick wall which required some suturing in the ER.  CT scan was negative.  Since being seen in the emergency room, the patient was found to have elevated blood pressures on follow-up.  She was currently on amlodipine 5 mg daily, blood pressure was 160/62.  I increased her amlodipine only slightly to 7.5 mg daily.  I rechecked a CBC and a be met to evaluate for anemia and kidney function.  She had been on Lasix 20 mg a day for chronic lower extremity edema.  I asked her to stop her Lasix and only use it as needed.  If she were to have a other falls or episodes of dizziness she was to report this to [place] and her primary care physician.  Follow-up labs revealed a creatinine of 1.49, GFR of 34.  Hopefully this will have improved with as needed Lasix only.  She was not found to be anemic hemoglobin was 11.1 and hematocrit was 38.1.  The patient {does/does not:200015} have symptoms concerning for  COVID-19 infection (fever, chills, cough, or new shortness of breath).    Past Medical History:  Diagnosis Date  . Arthritis   . Bilateral inguinal hernia   . Colonic polyp    Adenomatous  . Depression   . Diabetes mellitus type 2 in nonobese (HCC)   . Diverticulosis    Colon  . GERD (gastroesophageal reflux disease)   . H/O: hysterectomy 2003  . Hyperlipidemia   . Hypertension   . Osteoporosis   . Tubular adenoma    Shown on colonoscopy November 2008 and prior colonoscopy in 1998   Past Surgical History:  Procedure Laterality Date  . APPENDECTOMY    . LAPAROTOMY       No outpatient medications have been marked as taking for the 03/18/19 encounter (Appointment) with Lendon Colonel, NP.     Allergies:   Patient has no known allergies.   Social History   Tobacco Use  . Smoking status: Never Smoker  . Smokeless tobacco: Never Used  Substance Use Topics  . Alcohol use: Yes  . Drug use: No     Family Hx: The patient's family history includes Heart disease in her mother.  ROS:   Please see the history of present illness.    *** All other systems reviewed and are negative.   Prior CV studies:   The following studies were reviewed today:  Echocardiogram11/30/2016 Study Conclusions  - Left ventricle: There was mild focal basal hypertrophy of the septum.  The estimated ejection fraction was 55%. - Aortic valve: Calcified non coronary cusp. Valve area (VTI): 1.74 cm^2. Valve area (Vmax): 1.59 cm^2. Valve area (Vmean): 1.61 cm^2. - Mitral valve: Calcified annulus. There was mild regurgitation. - Left atrium: The atrium was moderately dilated.  Labs/Other Tests and Data Reviewed:    EKG:  {EKG/Telemetry Strips [phone]}  Recent Labs: 02/13/2019: BUN 33; Creatinine, Ser 1.49; Hemoglobin 11.1; Platelets 168; Potassium 4.7; Sodium 139   Recent Lipid Panel No results found for: CHOL, TRIG, HDL, CHOLHDL, LDLCALC, LDLDIRECT  Wt Readings  from Last 3 Encounters:  02/13/19 118 lb (53.5 kg)  01/22/19 120 lb (54.4 kg)  02/21/18 119 lb (54 kg)     Objective:    Vital Signs:  There were no vitals taken for this visit.   {HeartCare Virtual Exam (Optional):[phone]::"VITAL SIGNS:  reviewed"}  ASSESSMENT & PLAN:    1. ***  COVID-19 Education: The signs and symptoms of COVID-19 were discussed with the patient and how to seek care for testing (follow up with PCP or arrange E-visit).  ***The importance of social distancing was discussed today.  Time:   Today, I have spent *** minutes with the patient with telehealth technology discussing the above problems.     Medication Adjustments/Labs and Tests Ordered: Current medicines are reviewed at length with the patient today.  Concerns regarding medicines are outlined above.   Tests Ordered: No orders of the defined types were placed in this encounter.   Medication Changes: No orders of the defined types were placed in this encounter.   Disposition:  Follow up {follow up:15908}  Signed, Phill Myron. West Pugh, ANP, AACC  03/17/2019 12:08 PM    [facility]

## 2019-03-18 ENCOUNTER — Encounter: Payer: Self-pay | Admitting: *Deleted

## 2019-03-18 ENCOUNTER — Other Ambulatory Visit: Payer: Self-pay

## 2019-03-18 ENCOUNTER — Ambulatory Visit (INDEPENDENT_AMBULATORY_CARE_PROVIDER_SITE_OTHER): Payer: Medicare Other | Admitting: *Deleted

## 2019-03-18 ENCOUNTER — Telehealth: Payer: Medicare Other | Admitting: Adult Health

## 2019-03-18 VITALS — BP 160/68 | HR 60 | Ht 62.5 in | Wt 119.4 lb

## 2019-03-18 DIAGNOSIS — I10 Essential (primary) hypertension: Secondary | ICD-10-CM

## 2019-03-18 NOTE — Patient Instructions (Signed)
Medication Instructions:  INCREASE amlodipine to 10mg  daily Please continue other medications Please use lasix only for swelling/edema Continue to monitor your blood pressure at home  If you need a refill on your cardiac medications before your next appointment, please call your pharmacy.   Follow-Up: As scheduled with Curt Bears NP

## 2019-03-18 NOTE — Progress Notes (Signed)
Lendon Colonel, NP  Fidel Levy, RN; Vennie Homans        Thank you for seeing her and getting the BP readings. She is still a little elevated after increasing the amlodipine from 5 mg to 7.5 mg. Please increase this to amlodipine 10 mg daily. Still use lasix prn only for edema. I will see her on her rescheduled appointment.   Thank you so much,  KL

## 2019-03-18 NOTE — Progress Notes (Signed)
1.) Reason for visit: walk-in -- requesting BP check as she was scheduled for nurse visit today @ 2:45pm and was unaware her visit was virtual  2.) Name of MD requesting visit: N/A - was supposed to see K. Lawrence DNP today  3.) H&P: HTN, hyperlipidemia  4.) ROS related to problem: f/up of elevated BP, last seen 1 month ago  BP 160/68  HR 60  O2 97%  Weight 119.4  Patient has home BP readings from 9-17 - 9/21   159/73, 165/72, 151/64, 155/65, 152/72 and HR in the 60s  5.) Assessment and plan per MD: to be determined  Chart routed to [facility]

## 2019-03-19 ENCOUNTER — Telehealth: Payer: Self-pay | Admitting: *Deleted

## 2019-03-19 MED ORDER — AMLODIPINE BESYLATE 10 MG PO TABS: 10.0000 mg | ORAL_TABLET | Freq: Every day | ORAL | 2 refills | Status: DC

## 2019-03-19 NOTE — Telephone Encounter (Signed)
-----   Message from Lendon Colonel, NP sent at 03/18/2019  3:39 PM EDT ----- Thank you for seeing her and getting the BP readings. She is still a little elevated after increasing the amlodipine from 5 mg to 7.5 mg.  Please increase this to amlodipine 10 mg daily. Still use lasix prn only for edema. I will see her on her rescheduled appointment.   Thank you so much, KL ----- Message ----- From: Fidel Levy, RN Sent: 03/18/2019   3:18 PM EDT To: Lendon Colonel, NP

## 2019-03-19 NOTE — Telephone Encounter (Signed)
Spoke to pt about her medication changes, advised pt her Amlodipine was increase to 10 mg daily, Rx has been sent to the pharmacy electronically. Pt made aware to continue to take lasix as needed for swelling

## 2019-03-27 NOTE — Progress Notes (Deleted)
{Choose 1 Note Type (Telehealth Visit or Telephone Visit):[phone]}   Date:  03/28/2019   ID:  TANIEL WE, DOB 04-28-1922, MRN [id number]  {Patient Location:[phone]::"Home"} {Provider [phone]::"Home"}  PCP:  Jani Gravel, MD  Cardiologist:   Electrophysiologist:  None   Evaluation Performed:  {Choose Visit Type:[id number]::"Follow-Up Visit"}  Chief Complaint:  ***  History of Present Illness:   Brandi Mccoy is a 85 y.o. female who presents for ongoing assessment and management of hyperlipidemia, hypertension, with history of chronic left bundle branch block.  She has a history of a fall while working in her yard in August.  She sustained a laceration to her left forehead after hitting her head against a brick wall.  This did require sutures.  Her CT scan of the head did not reveal any acute infarct or intracerebral abnormalities.  She was seen last in the office on 02/13/2019, and was found to be hypertensive with a blood pressure of 160/62.  Amlodipine was increased to 10 mg daily over 2 consecutive follow-ups via email.  She was taking Lasix daily for chronic lower extremity edema and I asked her to stop it and take it only as needed.  She was also advised to wear support hose for venous stasis.  The patient {does/does not:200015} have symptoms concerning for COVID-19 infection (fever, chills, cough, or new shortness of breath).    Past Medical History:  Diagnosis Date  . Arthritis   . Bilateral inguinal hernia   . Colonic polyp    Adenomatous  . Depression   . Diabetes mellitus type 2 in nonobese (HCC)   . Diverticulosis    Colon  . GERD (gastroesophageal reflux disease)   . H/O: hysterectomy 2003  . Hyperlipidemia   . Hypertension   . Osteoporosis   . Tubular adenoma    Shown on colonoscopy November 2008 and prior colonoscopy in 1998   Past Surgical History:  Procedure Laterality Date  . APPENDECTOMY    . LAPAROTOMY       Current Meds  Medication  Sig  . amLODipine (NORVASC) 10 MG tablet Take 1 tablet (10 mg total) by mouth daily.  . chlorhexidine (PERIDEX) 0.12 % solution 30 mLs by Mouth Rinse route 2 (two) times daily. Swish and spit  . CLINPRO 5000 1.1 % PSTE Apply 1 application topically at bedtime. Use at bedtime instead of regular toothpaste (brush 2 minutes then spit out. Do not rinse)  . Cyanocobalamin (VITAMIN B-12 PO) Take 1 tablet by mouth daily.  . eszopiclone (LUNESTA) 2 MG TABS tablet Take 2 mg by mouth at bedtime as needed for sleep.   . ferrous sulfate 325 (65 FE) MG tablet Take 325 mg by mouth daily.  . furosemide (LASIX) 20 MG tablet TAKE 1 TABLET BY MOUTH ONCE A DAY AS NEEDED FOR EDEMA  . HYDROcodone-acetaminophen (NORCO/VICODIN) 5-325 MG tablet Take 1 tablet by mouth every 6 (six) hours as needed for severe pain.  . Multiple Vitamin (MULTIVITAMIN WITH MINERALS) TABS tablet Take 1 tablet by mouth daily.  . pioglitazone (ACTOS) 30 MG tablet Take 30 mg by mouth daily.  . pravastatin (PRAVACHOL) 10 MG tablet TAKE 1 TABLET ONCE A DAY FOR CHOLESTEROL (START 1X PER WEEK)     Allergies:   Patient has no known allergies.   Social History   Tobacco Use  . Smoking status: Never Smoker  . Smokeless tobacco: Never Used  Substance Use Topics  . Alcohol use: Yes  . Drug use: No  Family Hx: The patient's family history includes Heart disease in her mother.  ROS:   Please see the history of present illness.    *** All other systems reviewed and are negative.   Prior CV studies:   The following studies were reviewed today:  ***  Labs/Other Tests and Data Reviewed:    EKG:  {EKG/Telemetry Strips Reviewed:[phone]}  Recent Labs: 02/13/2019: BUN 33; Creatinine, Ser 1.49; Hemoglobin 11.1; Platelets 168; Potassium 4.7; Sodium 139   Recent Lipid Panel No results found for: CHOL, TRIG, HDL, CHOLHDL, LDLCALC, LDLDIRECT  Wt Readings from Last 3 Encounters:  03/28/19 108 lb (49 kg)  03/18/19 119 lb 6.4 oz (54.2  kg)  02/13/19 118 lb (53.5 kg)     Objective:    Vital Signs:  BP (!) 172/73   Pulse 63   Ht 5' 2.5" (1.588 m)   Wt 108 lb (49 kg)   BMI 19.44 kg/m    {HeartCare Virtual Exam (Optional):[phone]::"VITAL SIGNS:  reviewed"}  ASSESSMENT & PLAN:    1. ***  COVID-19 Education: The signs and symptoms of COVID-19 were discussed with the patient and how to seek care for testing (follow up with PCP or arrange E-visit).  ***The importance of social distancing was discussed today.  Time:   Today, I have spent *** minutes with the patient with telehealth technology discussing the above problems.     Medication Adjustments/Labs and Tests Ordered: Current medicines are reviewed at length with the patient today.  Concerns regarding medicines are outlined above.   Tests Ordered: No orders of the defined types were placed in this encounter.   Medication Changes: No orders of the defined types were placed in this encounter.   Disposition:  Follow up {follow up:15908}  Signed, Phill Myron. West Pugh, ANP, AACC  03/28/2019 11:11 AM    [place]

## 2019-03-28 ENCOUNTER — Ambulatory Visit (INDEPENDENT_AMBULATORY_CARE_PROVIDER_SITE_OTHER): Payer: Medicare Other | Admitting: Physician Assistant

## 2019-03-28 ENCOUNTER — Encounter: Payer: Self-pay | Admitting: Adult Health

## 2019-03-28 ENCOUNTER — Encounter: Payer: Self-pay | Admitting: Physician Assistant

## 2019-03-28 ENCOUNTER — Telehealth: Payer: Medicare Other | Admitting: Adult Health

## 2019-03-28 ENCOUNTER — Other Ambulatory Visit: Payer: Self-pay

## 2019-03-28 VITALS — BP 151/67 | HR 66 | Temp 95.0°F | Ht 62.5 in | Wt 119.8 lb

## 2019-03-28 DIAGNOSIS — Z79899 Other long term (current) drug therapy: Secondary | ICD-10-CM | POA: Diagnosis not present

## 2019-03-28 DIAGNOSIS — E119 Type 2 diabetes mellitus without complications: Secondary | ICD-10-CM

## 2019-03-28 DIAGNOSIS — I10 Essential (primary) hypertension: Secondary | ICD-10-CM | POA: Diagnosis not present

## 2019-03-28 DIAGNOSIS — E785 Hyperlipidemia, unspecified: Secondary | ICD-10-CM

## 2019-03-28 DIAGNOSIS — I447 Left bundle-branch block, unspecified: Secondary | ICD-10-CM | POA: Diagnosis not present

## 2019-03-28 MED ORDER — AMLODIPINE BESYLATE 10 MG PO TABS: 10.0000 mg | ORAL_TABLET | Freq: Every morning | ORAL | 3 refills | Status: DC

## 2019-03-28 MED ORDER — LOSARTAN POTASSIUM 25 MG PO TABS: 25.0000 mg | ORAL_TABLET | Freq: Every evening | ORAL | 2 refills | Status: DC

## 2019-03-28 NOTE — Progress Notes (Signed)
Cardiology Office Note    Date:  03/30/2019   ID:  Brandi Mccoy, DOB [birth year], MRN [id number]  PCP:  Jani Gravel, MD  Cardiologist:  Dr. Martinique  Chief Complaint  Patient presents with  . Follow-up    seen for Dr. Martinique.    History of Present Illness:  Brandi Mccoy is a 85 y.o. female with PMH of DM II, HTN, HLD, LBBB and GERD.  She was referred to Dr. Martinique in 2015 for dyspnea and palpitation.  Holter monitor at the time showed sinus bradycardia and sinus tachycardia, first-degree AV block and PACs, no atrial fibrillation.  Echocardiogram showed asymmetric septal hypertrophy and a normal LV function, mild MR.  Patient was seen by Kathrynn Humble for preoperative clearance on 05/19/2015.  Her blood pressure was over 200 at the time.  Low-dose carvedilol was added for her palpitation, she was quite stressed out and has chronic anxiety issues.  Repeat echocardiogram at the time continue to show mild aortic stenosis, no other significant finding.  Myoview obtained on 05/27/2015 showed EF 64%, small sized mild intensity fixed apical and distal anterior apical defect likely related to left bundle branch block, no significant reversible ischemia.  Overall considered a low risk study.  Patient was previously seen by Bunnie Domino, NP on 02/13/2019, her blood pressure was elevated at the time, amlodipine was increased to 7.5 mg daily.  Repeat echocardiogram obtained on 02/25/2019 showed EF 60 to 65%, mild to moderate LAE, moderate calcification of the mitral valve, mild aortic stenosis.  Based on the telephone note recently, her amlodipine has been increased to 10 mg on 9/22.  Patient presents today for cardiology office visit.  She does not have any symptom when her blood pressure is elevated.  Her Lasix has recently been discontinued due to worsening renal function with creatinine up to 1.5.  Her baseline creatinine however has been around 1.2 previously.  She was on quinapril in 2016 and that  she does not remember any issue or side effect on the medication.  Her systolic blood pressure has been in the 140-160s at home on 10 mg of amlodipine every morning.  I recommended addition of low-dose losartan 25 mg every night.  She will need a basic metabolic panel in 1 to 2 weeks and to follow-up with Bunnie Domino NP in 3 to 4 weeks.   Past Medical History:  Diagnosis Date  . Arthritis   . Bilateral inguinal hernia   . Colonic polyp    Adenomatous  . Depression   . Diabetes mellitus type 2 in nonobese (HCC)   . Diverticulosis    Colon  . GERD (gastroesophageal reflux disease)   . H/O: hysterectomy 2003  . Hyperlipidemia   . Hypertension   . Osteoporosis   . Tubular adenoma    Shown on colonoscopy November 2008 and prior colonoscopy in 1998    Past Surgical History:  Procedure Laterality Date  . APPENDECTOMY    . LAPAROTOMY      Current Medications: Outpatient Medications Prior to Visit  Medication Sig Dispense Refill  . Cyanocobalamin (VITAMIN B-12 PO) Take 1 tablet by mouth daily.    . eszopiclone (LUNESTA) 2 MG TABS tablet Take 2 mg by mouth at bedtime as needed for sleep.     . ferrous sulfate 325 (65 FE) MG tablet Take 325 mg by mouth daily.  1  . HYDROcodone-acetaminophen (NORCO/VICODIN) 5-325 MG tablet Take 1 tablet by mouth every 6 (six) hours as needed  for severe pain. 15 tablet 0  . Multiple Vitamin (MULTIVITAMIN WITH MINERALS) TABS tablet Take 1 tablet by mouth daily.    . pravastatin (PRAVACHOL) 10 MG tablet TAKE 1 TABLET ONCE A DAY FOR CHOLESTEROL (START 1X PER WEEK)  12  . amLODipine (NORVASC) 10 MG tablet Take 1 tablet (10 mg total) by mouth daily. 90 tablet 2  . chlorhexidine (PERIDEX) 0.12 % solution 30 mLs by Mouth Rinse route 2 (two) times daily. Swish and spit  1  . CLINPRO 5000 1.1 % PSTE Apply 1 application topically at bedtime. Use at bedtime instead of regular toothpaste (brush 2 minutes then spit out. Do not rinse)  12  . furosemide (LASIX) 20  MG tablet TAKE 1 TABLET BY MOUTH ONCE A DAY AS NEEDED FOR EDEMA  6  . pioglitazone (ACTOS) 30 MG tablet Take 30 mg by mouth daily.  1   No facility-administered medications prior to visit.      Allergies:   Patient has no known allergies.   Social History   Socioeconomic History  . Marital status: Married    Spouse name: Not on file  . Number of children: 2  . Years of education: Not on file  . Highest education level: Not on file  Occupational History  . Not on file  Social Needs  . Financial resource strain: Not on file  . Food insecurity    Worry: Not on file    Inability: Not on file  . Transportation needs    Medical: Not on file    Non-medical: Not on file  Tobacco Use  . Smoking status: Never Smoker  . Smokeless tobacco: Never Used  Substance and Sexual Activity  . Alcohol use: Yes  . Drug use: No  . Sexual activity: Not on file  Lifestyle  . Physical activity    Days per week: Not on file    Minutes per session: Not on file  . Stress: Not on file  Relationships  . Social Herbalist on phone: Not on file    Gets together: Not on file    Attends religious service: Not on file    Active member of club or organization: Not on file    Attends meetings of clubs or organizations: Not on file    Relationship status: Not on file  Other Topics Concern  . Not on file  Social History Narrative  . Not on file     Family History:  The patient's family history includes Heart disease in her mother.   ROS:   Please see the history of present illness.    ROS All other systems reviewed and are negative.   PHYSICAL EXAM:   VS:  BP (!) 151/67   Pulse 66   Temp (!) 95 F (35 C)   Ht 5' 2.5" (1.588 m)   Wt 119 lb 12.8 oz (54.3 kg)   SpO2 98%   BMI 21.56 kg/m    GEN: Well nourished, well developed, in no acute distress  HEENT: normal  Neck: no JVD, carotid bruits, or masses Cardiac: RRR; no murmurs, rubs, or gallops,no edema  Respiratory:  clear to  auscultation bilaterally, normal work of breathing GI: soft, nontender, nondistended, + BS MS: no deformity or atrophy  Skin: warm and dry, no rash Neuro:  Alert and Oriented x 3, Strength and sensation are intact Psych: euthymic mood, full affect  Wt Readings from Last 3 Encounters:  03/28/19 119 lb 12.8 oz (  54.3 kg)  03/28/19 108 lb (49 kg)  03/18/19 119 lb 6.4 oz (54.2 kg)      Studies/Labs Reviewed:   EKG:  EKG is not ordered today.    Recent Labs: 02/13/2019: BUN 33; Creatinine, Ser 1.49; Hemoglobin 11.1; Platelets 168; Potassium 4.7; Sodium 139   Lipid Panel No results found for: CHOL, TRIG, HDL, CHOLHDL, VLDL, LDLCALC, LDLDIRECT  Additional studies/ records that were reviewed today include:   Myoview 05/27/2015 Study Highlights   The left ventricular ejection fraction is normal (55-65%).  Nuclear stress EF: 64%.  No T wave inversion was noted during stress.  There was no ST segment deviation noted during stress.  Defect 1: There is a small defect of mild severity.   Small-size, mild intensity mostly fixed apical, distal anteroapical/anteroseptal artifact - likely related to LBBB. No significant reversible ischemia. LVEF 64% - normal wall motion. This is a low risk study.    Echo 02/25/2019 IMPRESSIONS    1. The left ventricle has normal systolic function with an ejection fraction of 60-65%. The cavity size was normal. There is mildly increased left ventricular wall thickness. Left ventricular diastolic Doppler parameters are indeterminate.  2. The right ventricle has normal systolic function. The cavity was normal. There is no increase in right ventricular wall thickness.  3. Left atrial size was mild-moderately dilated.  4. The mitral valve is degenerative. Moderate thickening of the mitral valve leaflet. Moderate calcification of the mitral valve leaflet. There is severe mitral annular calcification present.  5. Severe posterior annular calcification  extending sub chordally.  6. The aortic valve was not well visualized. Moderate thickening of the aortic valve. Moderate calcification of the aortic valve. Mild stenosis of the aortic valve.  7. The aorta is normal unless otherwise noted.   ASSESSMENT:    1. Essential hypertension   2. Medication management   3. Hyperlipidemia LDL goal <70   4. Controlled type 2 diabetes mellitus without complication, without long-term current use of insulin ([place])   5. LBBB (left bundle branch block)      PLAN:  In order of problems listed above:  1. Hypertension: Continue amlodipine 10 mg in the morning.  We will add losartan 25 mg at night.  2. Hyperlipidemia: Continue pravastatin  3. DM2: Managed by primary care provider  4. Chronic left bundle branch block: Unchanged, no angina.   Medication Adjustments/Labs and Tests Ordered: Current medicines are reviewed at length with the patient today.  Concerns regarding medicines are outlined above.  Medication changes, Labs and Tests ordered today are listed in the Patient Instructions below. Patient Instructions  Medication Instructions:  Almyra Deforest, PA has recommended making the following medication changes: 1. START Losartan 25 mg - take 1 tablet daily in the evening 2. TAKE Amlodipine 10 mg daily in the morning  If you need a refill on your cardiac medications before your next appointment, please call your pharmacy.   Lab work: Your physician recommends that you return for lab work in 1-2 weeks - at your convenience. You DO NOT have to be fasting for this lab work.  If you have labs (blood work) drawn today and your tests are completely normal, you will receive your results only by: Marland Kitchen MyChart Message (if you have MyChart) OR . A paper copy in the mail If you have any lab test that is abnormal or we need to change your treatment, we will call you to review the results.  Follow-Up: At [facility], you and  your health needs are our  priority.  As part of our continuing mission to provide you with exceptional heart care, we have created designated Provider Care Teams.  These Care Teams include your primary Cardiologist (physician) and Advanced Practice Providers (APPs -  Physician Assistants and Nurse Practitioners) who all work together to provide you with the care you need, when you need it. You will need a follow up appointment in 3-4 weeks.  Please call our office 2 months in advance to schedule this appointment.  You may see Peter Martinique, MD or one of the following Advanced Practice Providers on your designated Care Team: . Jory Sims, DNP    Signed, Almyra Deforest, [place]  03/30/2019 1:00 PM    [facility] [place], [place], [place]  [address] Phone: [phone]; Fax: [phone]

## 2019-03-28 NOTE — Patient Instructions (Addendum)
Medication Instructions:  Almyra Deforest, PA has recommended making the following medication changes: 1. START Losartan 25 mg - take 1 tablet daily in the evening 2. TAKE Amlodipine 10 mg daily in the morning  If you need a refill on your cardiac medications before your next appointment, please call your pharmacy.   Lab work: Your physician recommends that you return for lab work in 1-2 weeks - at your convenience. You DO NOT have to be fasting for this lab work.  If you have labs (blood work) drawn today and your tests are completely normal, you will receive your results only by: Marland Kitchen MyChart Message (if you have MyChart) OR . A paper copy in the mail If you have any lab test that is abnormal or we need to change your treatment, we will call you to review the results.  Follow-Up: At [facility], you and your health needs are our priority.  As part of our continuing mission to provide you with exceptional heart care, we have created designated Provider Care Teams.  These Care Teams include your primary Cardiologist (physician) and Advanced Practice Providers (APPs -  Physician Assistants and Nurse Practitioners) who all work together to provide you with the care you need, when you need it. You will need a follow up appointment in 3-4 weeks.  Please call our office 2 months in advance to schedule this appointment.  You may see Peter Martinique, MD or one of the following Advanced Practice Providers on your designated Care Team: . Jory Sims, DNP

## 2019-03-30 ENCOUNTER — Encounter: Payer: Self-pay | Admitting: Physician Assistant

## 2019-04-23 ENCOUNTER — Other Ambulatory Visit: Payer: Self-pay

## 2019-04-23 ENCOUNTER — Encounter: Payer: Self-pay | Admitting: Adult Health

## 2019-04-23 ENCOUNTER — Ambulatory Visit (INDEPENDENT_AMBULATORY_CARE_PROVIDER_SITE_OTHER): Payer: Medicare Other | Admitting: Adult Health

## 2019-04-23 VITALS — BP 163/63 | HR 59 | Ht 62.5 in | Wt 122.2 lb

## 2019-04-23 DIAGNOSIS — Z79899 Other long term (current) drug therapy: Secondary | ICD-10-CM | POA: Diagnosis not present

## 2019-04-23 NOTE — Progress Notes (Signed)
Cardiology Office Note   Date:  04/23/2019   ID:  Brandi Mccoy, DOB February 18, 1922, MRN [id number]  PCP:  Jani Gravel, MD  Cardiologist:  Dr. Martinique CC: Hypertension follow up   History of Present Illness: Brandi Mccoy is a 85 y.o. female who presents for ongoing assessment and management of palpitations with known history of hypertension, hyperlipidemia, left bundle branch block, anxiety and GERD.  Holter monitor in 2015 revealed sinus bradycardia sinus tachycardia and first-degree AV block with PACs but no atrial fibrillation.  Myoview obtained on 05/27/2015 revealed EF of 64% with a small size mild intensity fixed apical and distal anterior apical defect related to left bundle branch block with no significant reversible ischemia.  This was found to be a low risk study.  I saw her on 02/13/2019 with elevated blood pressure at that time and amlodipine was increased to 7.5 mg daily.  Repeat echocardiogram revealed normal LVEF with moderate calcification of the mitral valve with mild aortic stenosis.  Due to uncontrolled hypertension amlodipine was increased to 10 mg on 03/19/2019.  She followed up with Almyra Deforest, PA on 03/28/2019 at which time blood pressure was better controlled however not optimal as her systolics were between [id number] and 160 on 10 mg of amlodipine.  She was started on losartan 25 mg at at bedtime and is here for follow-up to evaluate her response to medication.  She was to have a follow-up BMET.  BP is still running elevated at home in the 150's- 160's per her recordings. She is asymptomatic. She is complaining of frequent urination on lasix, which she takes in the am, but finds herself having frequency at night while trying to sleep. She is wearing support hose for chronic LEE due to venous insufficiency.    Past Medical History:  Diagnosis Date   Arthritis    Bilateral inguinal hernia    Colonic polyp    Adenomatous   Depression    Diabetes mellitus type 2 in nonobese  [facility])    Diverticulosis    Colon   GERD (gastroesophageal reflux disease)    H/O: hysterectomy 2003   Hyperlipidemia    Hypertension    Osteoporosis    Tubular adenoma    Shown on colonoscopy November 2008 and prior colonoscopy in 1998    Past Surgical History:  Procedure Laterality Date   APPENDECTOMY     LAPAROTOMY       Current Outpatient Medications  Medication Sig Dispense Refill   amLODipine (NORVASC) 10 MG tablet Take 1 tablet (10 mg total) by mouth every morning. 90 tablet 3   chlorhexidine (PERIDEX) 0.12 % solution 30 mLs by Mouth Rinse route 2 (two) times daily. Swish and spit  1   CLINPRO 5000 1.1 % PSTE Apply 1 application topically at bedtime. Use at bedtime instead of regular toothpaste (brush 2 minutes then spit out. Do not rinse)  12   Cyanocobalamin (VITAMIN B-12 PO) Take 1 tablet by mouth daily.     eszopiclone (LUNESTA) 2 MG TABS tablet Take 2 mg by mouth at bedtime as needed for sleep.      ferrous sulfate 325 (65 FE) MG tablet Take 325 mg by mouth daily.  1   furosemide (LASIX) 20 MG tablet TAKE 1 TABLET BY MOUTH ONCE A DAY AS NEEDED FOR EDEMA  6   HYDROcodone-acetaminophen (NORCO/VICODIN) 5-325 MG tablet Take 1 tablet by mouth every 6 (six) hours as needed for severe pain. 15 tablet 0   losartan (COZAAR)  25 MG tablet Take 1 tablet (25 mg total) by mouth every evening. 30 tablet 2   Multiple Vitamin (MULTIVITAMIN WITH MINERALS) TABS tablet Take 1 tablet by mouth daily.     pioglitazone (ACTOS) 30 MG tablet Take 30 mg by mouth daily.  1   pravastatin (PRAVACHOL) 10 MG tablet TAKE 1 TABLET ONCE A DAY FOR CHOLESTEROL (START 1X PER WEEK)  12   No current facility-administered medications for this visit.     Allergies:   Patient has no known allergies.    Social History:  The patient  reports that she has never smoked. She has never used smokeless tobacco. She reports current alcohol use. She reports that she does not use drugs.   Family  History:  The patient's family history includes Heart disease in her mother.    ROS: All other systems are reviewed and negative. Unless otherwise mentioned in H&P    PHYSICAL EXAM: VS:  BP (!) 163/63    Pulse (!) 59    Ht 5' 2.5" (1.588 m)    Wt 122 lb 3.2 oz (55.4 kg)    SpO2 99%    BMI 21.99 kg/m  , BMI Body mass index is 21.99 kg/m. GEN: Well nourished, well developed, in no acute distress HEENT: normal Neck: no JVD, carotid bruits, or masses Cardiac: RRR; 1/6 systolic murmur, rubs, or gallops,dependent edema noted, with support hose. Respiratory:  Clear to auscultation bilaterally, normal work of breathing GI: soft, nontender, nondistended, + BS MS: no deformity or atrophy Skin: warm and dry, no rash Neuro:  Strength and sensation are intact Psych: euthymic mood, full affect   EKG:  Not completed today.   Recent Labs: 02/13/2019: BUN 33; Creatinine, Ser 1.49; Hemoglobin 11.1; Platelets 168; Potassium 4.7; Sodium 139    Lipid Panel No results found for: CHOL, TRIG, HDL, CHOLHDL, VLDL, LDLCALC, LDLDIRECT    Wt Readings from Last 3 Encounters:  04/23/19 122 lb 3.2 oz (55.4 kg)  03/28/19 119 lb 12.8 oz (54.3 kg)  03/28/19 108 lb (49 kg)      Other studies Reviewed: Echocardiogram Feb 05, 2019 1. The left ventricle has normal systolic function with an ejection fraction of 60-65%. The cavity size was normal. There is mildly increased left ventricular wall thickness. Left ventricular diastolic Doppler parameters are indeterminate.  2. The right ventricle has normal systolic function. The cavity was normal. There is no increase in right ventricular wall thickness.  3. Left atrial size was mild-moderately dilated.  4. The mitral valve is degenerative. Moderate thickening of the mitral valve leaflet. Moderate calcification of the mitral valve leaflet. There is severe mitral annular calcification present.  5. Severe posterior annular calcification extending sub chordally.  6. The  aortic valve was not well visualized. Moderate thickening of the aortic valve. Moderate calcification of the aortic valve. Mild stenosis of the aortic valve.  7. The aorta is normal unless otherwise noted.  ASSESSMENT AND PLAN:  1.  Hypertension:: She has been on multiple medications which have not fully controlled her blood pressure.  We have titrated up amlodipine to 10 mg daily and has had losartan 25 mg at at at bedtime.  Her blood pressure remains elevated at home as she has brought with her recordings with blood pressures running in the [id number] range systolic.  She is asymptomatic, denies any chest pressure, dizziness or vision changes.  She will have a BMET drawn today for kidney function.  She does have normal LV systolic function but does have  some valvular abnormalities with severe mitral annular calcification noted.  I am having her seen by Dr. Oval Linsey for further recommendations concerning blood pressure control in the setting of age, mitral valve disease, and slightly elevated creatinine of 1.49 in August.  We may have to allow for permissive hypertension but will defer to Dr. Blenda Mounts recommendations for further treatment regimen.  2.  Diabetes: Followed by PCP  3.  Palpitations: She denies any recurrent palpitations at this time.  Current medicines are reviewed at length with the patient today.    Labs/ tests ordered today include: BMET  Phill Myron. West Pugh, ANP, AACC   04/23/2019 3:54 PM    [facility] [place] [address] Office [phone] Fax [phone]  Notice: This dictation was prepared with Dragon dictation along with smaller phrase technology. Any transcriptional errors that result from this process are unintentional and may not be corrected upon review.

## 2019-04-23 NOTE — Patient Instructions (Signed)
Medication Instructions:  Continue current medications  *If you need a refill on your cardiac medications before your next appointment, please call your pharmacy*  Lab Work: BMP  If you have labs (blood work) drawn today and your tests are completely normal, you will receive your results only by: Marland Kitchen MyChart Message (if you have MyChart) OR . A paper copy in the mail If you have any lab test that is abnormal or we need to change your treatment, we will call you to review the results.  Testing/Procedures: None Ordered  Follow-Up: At [facility], you and your health needs are our priority.  As part of our continuing mission to provide you with exceptional heart care, we have created designated Provider Care Teams.  These Care Teams include your primary Cardiologist (physician) and Advanced Practice Providers (APPs -  Physician Assistants and Nurse Practitioners) who all work together to provide you with the care you need, when you need it.  Your next appointment:   Next available with Dr Oval Linsey

## 2019-04-24 ENCOUNTER — Telehealth: Payer: Self-pay | Admitting: *Deleted

## 2019-04-24 DIAGNOSIS — Z79899 Other long term (current) drug therapy: Secondary | ICD-10-CM

## 2019-04-24 LAB — BASIC METABOLIC PANEL
BUN/Creatinine Ratio: 19 (ref 12–28)
BUN: 34 mg/dL (ref 10–36)
CO2: 24 mmol/L (ref 20–29)
Calcium: 10.1 mg/dL (ref 8.7–10.3)
Chloride: 103 mmol/L (ref 96–106)
Creatinine, Ser: 1.77 mg/dL — ABNORMAL HIGH (ref 0.57–1.00)
GFR calc Af Amer: 28 mL/min/{1.73_m2} — ABNORMAL LOW (ref >59)
GFR calc non Af Amer: 24 mL/min/{1.73_m2} — ABNORMAL LOW (ref >59)
Glucose: 128 mg/dL — ABNORMAL HIGH (ref 65–99)
Potassium: 4.4 mmol/L (ref 3.5–5.2)
Sodium: 140 mmol/L (ref 134–144)

## 2019-04-24 MED ORDER — HYDRALAZINE HCL 10 MG PO TABS: 10.0000 mg | ORAL_TABLET | Freq: Two times a day (BID) | ORAL | 6 refills | Status: DC

## 2019-04-24 NOTE — Telephone Encounter (Signed)
Spoke with pt and her daughter about her blood work and medications changes, BMP ordered and mailed to pt.

## 2019-04-24 NOTE — Telephone Encounter (Signed)
-----   Message from Lendon Colonel, NP sent at 04/24/2019  7:12 AM EDT ----- Kidney function has worsened on losartan. Stop this.  Begin hydralazine 10 mg BID instead. Repeat BMET in 2 weeks. Increase fluids.

## 2019-05-03 DIAGNOSIS — Z23 Encounter for immunization: Secondary | ICD-10-CM | POA: Diagnosis not present

## 2019-05-21 ENCOUNTER — Other Ambulatory Visit: Payer: Self-pay

## 2019-05-27 ENCOUNTER — Other Ambulatory Visit: Payer: Self-pay

## 2019-05-27 ENCOUNTER — Encounter: Payer: Self-pay | Admitting: Cardiovascular Disease

## 2019-05-27 ENCOUNTER — Ambulatory Visit (INDEPENDENT_AMBULATORY_CARE_PROVIDER_SITE_OTHER): Payer: Medicare Other | Admitting: Cardiovascular Disease

## 2019-05-27 VITALS — BP 184/80 | HR 62 | Ht 62.0 in | Wt 120.0 lb

## 2019-05-27 DIAGNOSIS — I10 Essential (primary) hypertension: Secondary | ICD-10-CM

## 2019-05-27 DIAGNOSIS — R1319 Other dysphagia: Secondary | ICD-10-CM

## 2019-05-27 DIAGNOSIS — R1310 Dysphagia, unspecified: Secondary | ICD-10-CM

## 2019-05-27 MED ORDER — HYDRALAZINE HCL 25 MG PO TABS: 25.0000 mg | ORAL_TABLET | Freq: Three times a day (TID) | ORAL | 1 refills | Status: DC

## 2019-05-27 NOTE — Patient Instructions (Addendum)
Medication Instructions:  INCREASE YOUR HYDRALAZINE TO 25 MG THREE TIMES A DAY, A NEW PRESCRIPTION FOR THE 25 MG TABLETS HAS BEEN SENT TO YOUR PHARMACY   *If you need a refill on your cardiac medications before your next appointment, please call your pharmacy*  Lab Work: NONE   Testing/Procedures: NONE  Follow-Up: At [facility], you and your health needs are our priority.  As part of our continuing mission to provide you with exceptional heart care, we have created designated Provider Care Teams.  These Care Teams include your primary Cardiologist (physician) and Advanced Practice Providers (APPs -  Physician Assistants and Nurse Practitioners) who all work together to provide you with the care you need, when you need it.  Your next appointment:   4-6 WEEKS  The format for your next appointment:   Virtual Visit   Provider:   You may see DR Cashion [place]    MONITOR AND LOG YOUR BLOOD PRESSURE DAILY AT HOME. HAVE THOSE READINGS AVAILABLE FOR YOUR VIRTUAL VISIT

## 2019-05-27 NOTE — Progress Notes (Signed)
Cardiology Office Note   Date:  05/27/2019   ID:  Brandi Mccoy, DOB 15-Oct-1921, MRN [id number]  PCP:  Brandi Gravel, MD  Cardiologist:   Dr. Peter Mccoy  No chief complaint on file.    History of Present Illness: Brandi Mccoy is a 85 y.o. female with hypertension, hyperlipidemia, left bundle branch block, anxiety, GERD, and palpitations who presents for management of hypertension.  Brandi Mccoy is a patient of Dr. Martinique.  She has been working with Brandi Sims, DNP on her blood pressure.  She reports having high blood pressure for over 10 years.  She saw Brandi Mccoy 01/2019 and her blood pressure was poorly controlled so amlodipine was increased to 7.5 mg daily.  It was later increased to 10 mg on 9/22.  She followed up with Brandi Deforest, PA, on 10/202 and losartan 25 mg was added.  However her renal function worsened so this was discontinued and she was started on hydralazine 10 mg twice daily.  She takes amlodipine and hydralazine around 8 or 9 AM with her breakfast.  She checks her blood pressure just before taking her medication.  At home her blood pressure continues to run in the 150s to 170s over 60s to 70s.  She has not seen much change since starting hydralazine.  Overall she has felt well.  She has some chronic lower extremity edema that is stable, if not slightly improved.  She denies orthopnea or PND.  She takes the furosemide approximately once per week for edema.  She had an echo 01/2019 that revealed LVEF 60 to 65% with mild LVH and severe mitral annular calcification.  There was also mild aortic stenosis.  Nuclear stress test was negative for ischemia 04/2015.   Previous antihypertensives: Quinapril- unsure why it was stopped Losartan- acute on chronic renal failure   Past Medical History:  Diagnosis Date   Arthritis    Bilateral inguinal hernia    Colonic polyp    Adenomatous   Depression    Diabetes mellitus type 2 in nonobese [facility])    Diverticulosis    Colon    GERD (gastroesophageal reflux disease)    H/O: hysterectomy 2003   Hyperlipidemia    Hypertension    Osteoporosis    Tubular adenoma    Shown on colonoscopy November 2008 and prior colonoscopy in 1998    Past Surgical History:  Procedure Laterality Date   APPENDECTOMY     LAPAROTOMY       Current Outpatient Medications  Medication Sig Dispense Refill   amLODipine (NORVASC) 10 MG tablet Take 1 tablet (10 mg total) by mouth every morning. 90 tablet 3   chlorhexidine (PERIDEX) 0.12 % solution 30 mLs by Mouth Rinse route 2 (two) times daily. Swish and spit  1   CLINPRO 5000 1.1 % PSTE Apply 1 application topically at bedtime. Use at bedtime instead of regular toothpaste (brush 2 minutes then spit out. Do not rinse)  12   Cyanocobalamin (VITAMIN B-12 PO) Take 1 tablet by mouth daily.     eszopiclone (LUNESTA) 2 MG TABS tablet Take 2 mg by mouth at bedtime as needed for sleep.      ferrous sulfate 325 (65 FE) MG tablet Take 325 mg by mouth daily.  1   furosemide (LASIX) 20 MG tablet TAKE 1 TABLET BY MOUTH ONCE A DAY AS NEEDED FOR EDEMA  6   hydrALAZINE (APRESOLINE) 25 MG tablet Take 1 tablet (25 mg total) by mouth 3 (three) times  daily. 90 tablet 1   HYDROcodone-acetaminophen (NORCO/VICODIN) 5-325 MG tablet Take 1 tablet by mouth every 6 (six) hours as needed for severe pain. 15 tablet 0   Multiple Vitamin (MULTIVITAMIN WITH MINERALS) TABS tablet Take 1 tablet by mouth daily.     pioglitazone (ACTOS) 30 MG tablet Take 30 mg by mouth daily.  1   pravastatin (PRAVACHOL) 10 MG tablet TAKE 1 TABLET ONCE A DAY FOR CHOLESTEROL (START 1X PER WEEK)  12   No current facility-administered medications for this visit.     Allergies:   Patient has no known allergies.    Social History:  The patient  reports that she has never smoked. She has never used smokeless tobacco. She reports current alcohol use. She reports that she does not use drugs.   Family History:  The patient's  family history includes Heart disease in her mother.    ROS:  Please see the history of present illness.  +dysphagia Otherwise, review of systems are positive for none.   All other systems are reviewed and negative.    PHYSICAL EXAM: VS:  BP (!) 184/80    Pulse 62    Ht 5\' 2"  (1.575 m)    Wt 120 lb (54.4 kg)    SpO2 100%    BMI 21.95 kg/m  , BMI Body mass index is 21.95 kg/m. GENERAL:  Well appearing.  Difficulty hearting HEENT:  Pupils equal round and reactive, fundi not visualized, oral mucosa unremarkable NECK:  No jugular venous distention, waveform within normal limits, carotid upstroke brisk and symmetric, no bruits LUNGS:  Clear to auscultation bilaterally HEART:  RRR.  PMI not displaced or sustained,S1 and S2 within normal limits, no S3, no S4, no clicks, no rubs, III/VI systolic murmur at the LUSB ABD:  Flat, positive bowel sounds normal in frequency in pitch, no bruits, no rebound, no guarding, no midline pulsatile mass, no hepatomegaly, no splenomegaly EXT:  2 plus pulses throughout, no edema, no cyanosis no clubbing SKIN:  No rashes no nodules NEURO:  Cranial nerves II through XII grossly intact, motor grossly intact throughout PSYCH:  Cognitively intact, oriented to person place and time   EKG:  EKG is not ordered today.   Echo 02/25/19: IMPRESSIONS    1. The left ventricle has normal systolic function with an ejection fraction of 60-65%. The cavity size was normal. There is mildly increased left ventricular wall thickness. Left ventricular diastolic Doppler parameters are indeterminate.  2. The right ventricle has normal systolic function. The cavity was normal. There is no increase in right ventricular wall thickness.  3. Left atrial size was mild-moderately dilated.  4. The mitral valve is degenerative. Moderate thickening of the mitral valve leaflet. Moderate calcification of the mitral valve leaflet. There is severe mitral annular calcification present.  5. Severe  posterior annular calcification extending sub chordally.  6. The aortic valve was not well visualized. Moderate thickening of the aortic valve. Moderate calcification of the aortic valve. Mild stenosis of the aortic valve.  7. The aorta is normal unless otherwise noted.   Recent Labs: 02/13/2019: Hemoglobin 11.1; Platelets 168 04/23/2019: BUN 34; Creatinine, Ser 1.77; Potassium 4.4; Sodium 140    Lipid Panel No results found for: CHOL, TRIG, HDL, CHOLHDL, VLDL, LDLCALC, LDLDIRECT    Wt Readings from Last 3 Encounters:  05/27/19 120 lb (54.4 kg)  04/23/19 122 lb 3.2 oz (55.4 kg)  03/28/19 119 lb 12.8 oz (54.3 kg)      ASSESSMENT AND PLAN:  #  Essential hypertension:  BP has been consistently elevated at home.  She is on a very low-dose of hydralazine and amlodipine has already been maximized.  Bradycardia prohibits nodal agents.  She had worsening in her renal function with a low-dose of an ARB.  Therefore we will switch hydralazine to 25 mg 3 times daily.  Continue amlodipine.  If her blood pressure remains greater than 130/80 we will plan to increase hydralazine to 50 mg.  # Dysphagia:  Ms. Bowlds reported that pills and food sometimes have a difficulty going down.  I recommended that she see gastroenterology.  She is going to think about this.   Current medicines are reviewed at length with the patient today.  The patient does not have concerns regarding medicines.  The following changes have been made: Increase hydralazine  Labs/ tests ordered today include:  No orders of the defined types were placed in this encounter.    Disposition:   FU with Tashawn Greff C. Oval Linsey, MD, [facility] in 4-6 weeks virtually     Signed, Gissella Niblack C. Oval Linsey, MD, [facility]  05/27/2019 12:45 PM    [place]

## 2019-06-18 ENCOUNTER — Other Ambulatory Visit: Payer: Self-pay | Admitting: Cardiovascular Disease

## 2019-07-01 ENCOUNTER — Telehealth: Payer: Self-pay | Admitting: Cardiovascular Disease

## 2019-07-01 NOTE — Telephone Encounter (Signed)
Spoke with pt's daughter and has concerns with below B/P readings Pt has no other symptoms and feels fine Will forward message to Dr Oval Linsey for review and recommendations.

## 2019-07-01 NOTE — Telephone Encounter (Signed)
Pt c/o BP issue: STAT if pt c/o blurred vision, one-sided weakness or slurred speech  1. What are your last 5 BP readings? 06-28-19- 154/64 06-29-19 157/67 06-30-19- 156/76 07-01-19 151/71   2. Are you having any other symptoms (ex. Dizziness, headache, blurred vision, passed out)? No   3. What is your BP issue?  It is running high- she have been on  Amlodipine and  hydrazaline for over a month and it is still running high

## 2019-07-02 NOTE — Telephone Encounter (Signed)
Increase hydralazine to 50 mg t.i.d.

## 2019-07-02 NOTE — Telephone Encounter (Signed)
Lm to call back ./cy 

## 2019-07-03 MED ORDER — HYDRALAZINE HCL 50 MG PO TABS: 50.0000 mg | ORAL_TABLET | Freq: Three times a day (TID) | ORAL | 0 refills | Status: DC

## 2019-07-03 NOTE — Telephone Encounter (Signed)
Lm to call back ./cy 

## 2019-07-03 NOTE — Telephone Encounter (Signed)
Follow up ° ° °Patient is returning call. Please call. °

## 2019-07-03 NOTE — Telephone Encounter (Signed)
Pt verbalized understanding to increase her Hydralazine to 50 mg TID.Marland Kitchen. will keep track of her BP and keep her 07/25/19 appt.

## 2019-07-05 ENCOUNTER — Other Ambulatory Visit: Payer: Self-pay

## 2019-07-05 NOTE — Telephone Encounter (Signed)
Refill

## 2019-07-08 ENCOUNTER — Other Ambulatory Visit: Payer: Self-pay

## 2019-07-08 NOTE — Telephone Encounter (Signed)
REFILL 

## 2019-07-18 ENCOUNTER — Other Ambulatory Visit: Payer: Self-pay | Admitting: Adult Health

## 2019-07-19 ENCOUNTER — Other Ambulatory Visit: Payer: Self-pay | Admitting: Cardiovascular Disease

## 2019-07-25 ENCOUNTER — Telehealth: Payer: Medicare Other | Admitting: Cardiovascular Disease

## 2019-08-18 ENCOUNTER — Ambulatory Visit: Payer: Medicare Other | Attending: Internal Medicine

## 2019-08-18 DIAGNOSIS — Z23 Encounter for immunization: Secondary | ICD-10-CM | POA: Insufficient documentation

## 2019-08-18 NOTE — Progress Notes (Signed)
   Covid-19 Vaccination Clinic  Name:  JOBETH FURTAW    MRN: [id number] DOB: Apr 26, 1922  08/18/2019  Ms. Vinton was observed post Covid-19 immunization for 15 minutes without incidence. She was provided with Vaccine Information Sheet and instruction to access the V-Safe system.   Ms. Facchini was instructed to call 911 with any severe reactions post vaccine: Marland Kitchen Difficulty breathing  . Swelling of your face and throat  . A fast heartbeat  . A bad rash all over your body  . Dizziness and weakness    Immunizations Administered    Name Date Dose VIS Date Route   Pfizer COVID-19 Vaccine 08/18/2019  2:01 PM 0.3 mL 06/07/2019 Intramuscular   Manufacturer: [place]   Lot: [id number]   [place]: [id number]

## 2019-09-11 ENCOUNTER — Ambulatory Visit: Payer: Medicare Other | Attending: Internal Medicine

## 2019-09-11 DIAGNOSIS — Z23 Encounter for immunization: Secondary | ICD-10-CM

## 2019-09-11 NOTE — Progress Notes (Signed)
   Covid-19 Vaccination Clinic  Name:  Brandi Mccoy    MRN: [id number] DOB: 12-09-1921  09/11/2019  [name] was observed post Covid-19 immunization for 15 minutes without incident. She was provided with Vaccine Information Sheet and instruction to access the V-Safe system.   [name] was instructed to call 911 with any severe reactions post vaccine: Marland Kitchen Difficulty breathing  . Swelling of face and throat  . A fast heartbeat  . A bad rash all over body  . Dizziness and weakness   Immunizations Administered    Name Date Dose VIS Date Route   Pfizer COVID-19 Vaccine 09/11/2019  2:13 PM 0.3 mL 06/07/2019 Intramuscular   Manufacturer: [place]   Lot: [id number]   [place]: [id number]

## 2019-09-26 ENCOUNTER — Other Ambulatory Visit: Payer: Self-pay | Admitting: Cardiovascular Disease

## 2019-09-29 NOTE — Telephone Encounter (Signed)
It should be 50mg  tid

## 2019-11-08 DIAGNOSIS — E7800 Pure hypercholesterolemia, unspecified: Secondary | ICD-10-CM | POA: Diagnosis not present

## 2019-11-08 DIAGNOSIS — R011 Cardiac murmur, unspecified: Secondary | ICD-10-CM | POA: Diagnosis not present

## 2019-11-08 DIAGNOSIS — E119 Type 2 diabetes mellitus without complications: Secondary | ICD-10-CM | POA: Diagnosis not present

## 2019-11-08 DIAGNOSIS — I10 Essential (primary) hypertension: Secondary | ICD-10-CM | POA: Diagnosis not present

## 2019-11-08 DIAGNOSIS — F418 Other specified anxiety disorders: Secondary | ICD-10-CM | POA: Diagnosis not present

## 2019-11-08 DIAGNOSIS — K469 Unspecified abdominal hernia without obstruction or gangrene: Secondary | ICD-10-CM | POA: Diagnosis not present

## 2019-12-26 ENCOUNTER — Other Ambulatory Visit: Payer: Self-pay | Admitting: Adult Health

## 2020-04-15 DIAGNOSIS — H52223 Regular astigmatism, bilateral: Secondary | ICD-10-CM | POA: Diagnosis not present

## 2020-04-15 DIAGNOSIS — H524 Presbyopia: Secondary | ICD-10-CM | POA: Diagnosis not present

## 2020-04-15 DIAGNOSIS — H47033 Optic nerve hypoplasia, bilateral: Secondary | ICD-10-CM | POA: Diagnosis not present

## 2020-04-15 DIAGNOSIS — Z961 Presence of intraocular lens: Secondary | ICD-10-CM | POA: Diagnosis not present

## 2020-04-15 DIAGNOSIS — H5203 Hypermetropia, bilateral: Secondary | ICD-10-CM | POA: Diagnosis not present

## 2020-04-15 DIAGNOSIS — H4720 Unspecified optic atrophy: Secondary | ICD-10-CM | POA: Diagnosis not present

## 2020-04-15 DIAGNOSIS — E119 Type 2 diabetes mellitus without complications: Secondary | ICD-10-CM | POA: Diagnosis not present

## 2020-04-28 DIAGNOSIS — E7800 Pure hypercholesterolemia, unspecified: Secondary | ICD-10-CM | POA: Diagnosis not present

## 2020-04-28 DIAGNOSIS — E119 Type 2 diabetes mellitus without complications: Secondary | ICD-10-CM | POA: Diagnosis not present

## 2020-04-28 DIAGNOSIS — I10 Essential (primary) hypertension: Secondary | ICD-10-CM | POA: Diagnosis not present

## 2020-05-12 DIAGNOSIS — I10 Essential (primary) hypertension: Secondary | ICD-10-CM | POA: Diagnosis not present

## 2020-05-12 DIAGNOSIS — M8088XA Other osteoporosis with current pathological fracture, vertebra(e), initial encounter for fracture: Secondary | ICD-10-CM | POA: Diagnosis not present

## 2020-05-12 DIAGNOSIS — R011 Cardiac murmur, unspecified: Secondary | ICD-10-CM | POA: Diagnosis not present

## 2020-05-12 DIAGNOSIS — D649 Anemia, unspecified: Secondary | ICD-10-CM | POA: Diagnosis not present

## 2020-05-12 DIAGNOSIS — E119 Type 2 diabetes mellitus without complications: Secondary | ICD-10-CM | POA: Diagnosis not present

## 2020-05-12 DIAGNOSIS — F418 Other specified anxiety disorders: Secondary | ICD-10-CM | POA: Diagnosis not present

## 2020-05-12 DIAGNOSIS — F5101 Primary insomnia: Secondary | ICD-10-CM | POA: Diagnosis not present

## 2020-05-12 DIAGNOSIS — R350 Frequency of micturition: Secondary | ICD-10-CM | POA: Diagnosis not present

## 2020-05-12 DIAGNOSIS — F419 Anxiety disorder, unspecified: Secondary | ICD-10-CM | POA: Diagnosis not present

## 2020-05-12 DIAGNOSIS — E7800 Pure hypercholesterolemia, unspecified: Secondary | ICD-10-CM | POA: Diagnosis not present

## 2020-06-18 DIAGNOSIS — Z23 Encounter for immunization: Secondary | ICD-10-CM | POA: Diagnosis not present

## 2020-06-23 DIAGNOSIS — H524 Presbyopia: Secondary | ICD-10-CM | POA: Diagnosis not present

## 2020-06-23 DIAGNOSIS — H5203 Hypermetropia, bilateral: Secondary | ICD-10-CM | POA: Diagnosis not present

## 2020-06-23 DIAGNOSIS — H04121 Dry eye syndrome of right lacrimal gland: Secondary | ICD-10-CM | POA: Diagnosis not present

## 2020-06-23 DIAGNOSIS — H52223 Regular astigmatism, bilateral: Secondary | ICD-10-CM | POA: Diagnosis not present

## 2021-01-25 DIAGNOSIS — E1165 Type 2 diabetes mellitus with hyperglycemia: Secondary | ICD-10-CM | POA: Diagnosis not present

## 2021-01-25 DIAGNOSIS — D509 Iron deficiency anemia, unspecified: Secondary | ICD-10-CM | POA: Diagnosis not present

## 2021-01-25 DIAGNOSIS — F418 Other specified anxiety disorders: Secondary | ICD-10-CM | POA: Diagnosis not present

## 2021-01-25 DIAGNOSIS — Z0000 Encounter for general adult medical examination without abnormal findings: Secondary | ICD-10-CM | POA: Diagnosis not present

## 2021-01-25 DIAGNOSIS — I10 Essential (primary) hypertension: Secondary | ICD-10-CM | POA: Diagnosis not present

## 2021-01-25 DIAGNOSIS — E7800 Pure hypercholesterolemia, unspecified: Secondary | ICD-10-CM | POA: Diagnosis not present

## 2021-01-25 DIAGNOSIS — F5101 Primary insomnia: Secondary | ICD-10-CM | POA: Diagnosis not present

## 2021-02-01 DIAGNOSIS — Z85828 Personal history of other malignant neoplasm of skin: Secondary | ICD-10-CM | POA: Diagnosis not present

## 2021-02-01 DIAGNOSIS — C44729 Squamous cell carcinoma of skin of left lower limb, including hip: Secondary | ICD-10-CM | POA: Diagnosis not present

## 2021-03-03 DIAGNOSIS — L439 Lichen planus, unspecified: Secondary | ICD-10-CM | POA: Diagnosis not present

## 2021-04-01 DIAGNOSIS — L439 Lichen planus, unspecified: Secondary | ICD-10-CM | POA: Diagnosis not present

## 2021-04-28 ENCOUNTER — Emergency Department (HOSPITAL_COMMUNITY): Payer: Medicare Other

## 2021-04-28 ENCOUNTER — Other Ambulatory Visit: Payer: Self-pay

## 2021-04-28 ENCOUNTER — Emergency Department (HOSPITAL_COMMUNITY)
Admission: EM | Admit: 2021-04-28 | Discharge: 2021-04-28 | Disposition: A | Discharge status: Home / Self Care | Payer: Medicare Other | Attending: Emergency Medicine | Admitting: Emergency Medicine

## 2021-04-28 ENCOUNTER — Encounter (HOSPITAL_COMMUNITY): Payer: Self-pay

## 2021-04-28 DIAGNOSIS — E119 Type 2 diabetes mellitus without complications: Secondary | ICD-10-CM | POA: Diagnosis not present

## 2021-04-28 DIAGNOSIS — Z79899 Other long term (current) drug therapy: Secondary | ICD-10-CM | POA: Diagnosis not present

## 2021-04-28 DIAGNOSIS — I10 Essential (primary) hypertension: Secondary | ICD-10-CM | POA: Insufficient documentation

## 2021-04-28 DIAGNOSIS — S42471A Displaced transcondylar fracture of right humerus, initial encounter for closed fracture: Secondary | ICD-10-CM | POA: Diagnosis not present

## 2021-04-28 DIAGNOSIS — T148XXA Other injury of unspecified body region, initial encounter: Secondary | ICD-10-CM

## 2021-04-28 DIAGNOSIS — W010XXA Fall on same level from slipping, tripping and stumbling without subsequent striking against object, initial encounter: Secondary | ICD-10-CM | POA: Diagnosis not present

## 2021-04-28 DIAGNOSIS — Y92019 Unspecified place in single-family (private) house as the place of occurrence of the external cause: Secondary | ICD-10-CM | POA: Insufficient documentation

## 2021-04-28 DIAGNOSIS — M25521 Pain in right elbow: Secondary | ICD-10-CM | POA: Diagnosis not present

## 2021-04-28 DIAGNOSIS — M25511 Pain in right shoulder: Secondary | ICD-10-CM | POA: Diagnosis not present

## 2021-04-28 DIAGNOSIS — S51811A Laceration without foreign body of right forearm, initial encounter: Secondary | ICD-10-CM | POA: Diagnosis not present

## 2021-04-28 DIAGNOSIS — M7989 Other specified soft tissue disorders: Secondary | ICD-10-CM | POA: Diagnosis not present

## 2021-04-28 MED ORDER — HYDROCODONE-ACETAMINOPHEN 5-325 MG PO TABS
1.0000 | ORAL_TABLET | Freq: Once | ORAL | Status: AC
  Administered 2021-04-28: 1 via ORAL
  Filled 2021-04-28: qty 1

## 2021-04-28 MED ORDER — HYDROCODONE-ACETAMINOPHEN 5-325 MG PO TABS: 1.0000 | ORAL_TABLET | Freq: Four times a day (QID) | ORAL | 0 refills | Status: DC | PRN

## 2021-04-28 NOTE — Progress Notes (Signed)
Orthopedic Tech Progress Note Patient Details:  Brandi Mccoy 08/14/1921 [id number]  Ortho Devices Type of Ortho Device: Long arm splint Ortho Device/Splint Location: RUE Ortho Device/Splint Interventions: Ordered, Application   Post Interventions Patient Tolerated: Poor Instructions Provided: Care of device Pt was very difficult to splint as they kept rotating their arm and moving around. Did not tolerate well either, so molding was also a challenge.  Vernona Rieger 04/28/2021, 9:17 PM

## 2021-04-28 NOTE — ED Triage Notes (Signed)
Pt c/o fall today at 1630 at home. Pt had skin tears to right forearm. Pt c/o right elbow pain. Pt has skin tear to right knee.

## 2021-04-28 NOTE — ED Provider Notes (Signed)
[facility] DEPT Provider Note   CSN: [id number] Arrival date & time: 04/28/21  1821     History Chief Complaint  Patient presents with   Fall   Laceration    Brandi Mccoy is a 87 y.o. female.   Fall  Laceration  Patient presents to the ED for evaluation after a fall and injury to her arm that occurred today.  Patient states she was putting up some decorations in her home when she ended up tripping and falling.  Patient ended up injuring her elbow and her right knee.  Patient states she developed a skin tear but is also having pain when she tries to move her elbow.  She does have some mild discomfort up towards the shoulder.  She denies any headache or loss of consciousness.  She did not get weak.  Patient was able to stand up after the fall.  Past Medical History:  Diagnosis Date   Arthritis    Bilateral inguinal hernia    Colonic polyp    Adenomatous   Depression    Diabetes mellitus type 2 in nonobese [facility])    Diverticulosis    Colon   GERD (gastroesophageal reflux disease)    H/O: hysterectomy 2003   Hyperlipidemia    Hypertension    Osteoporosis    Tubular adenoma    Shown on colonoscopy November 2008 and prior colonoscopy in 1998    Patient Active Problem List   Diagnosis Date Noted   L1 vertebral fracture ([place]) 11/14/2015   Compression fracture of lumbar vertebra ([place]) 11/13/2015   Dyspnea 10/10/2013   Palpitations 10/10/2013   INGUINAL HERNIA 04/20/2010   Essential hypertension 03/30/2010   UNSPECIFIED INFECTION OF KIDNEY 03/30/2010   Loss of weight 03/30/2010   DIARRHEA 03/30/2010   Abdominal pain, other specified site 03/30/2010   PERSONAL HX COLONIC POLYPS 03/30/2010   COLONIC POLYPS, ADENOMATOUS 08/21/2007   DM2 (diabetes mellitus, type 2) ([place]) 08/21/2007   Hyperlipidemia LDL goal <70 08/21/2007   DEPRESSION 08/21/2007   GERD 08/21/2007   DIVERTICULOSIS, COLON 08/21/2007   ARTHRITIS 08/21/2007   OSTEOPOROSIS  08/21/2007    Past Surgical History:  Procedure Laterality Date   APPENDECTOMY     LAPAROTOMY       OB History   No obstetric history on file.     Family History  Problem Relation Age of Onset   Heart disease Mother     Social History   Tobacco Use   Smoking status: Never   Smokeless tobacco: Never  Substance Use Topics   Alcohol use: Yes   Drug use: No    Home Medications Prior to Admission medications   Medication Sig Start Date End Date Taking? Authorizing Provider  HYDROcodone-acetaminophen (NORCO/VICODIN) 5-325 MG tablet Take 1 tablet by mouth every 6 (six) hours as needed. 04/28/21  Yes Dorie Rank, MD  amLODipine (NORVASC) 10 MG tablet Take 1 tablet (10 mg total) by mouth every morning. 03/28/19   Almyra Deforest, PA  amLODipine (NORVASC) 10 MG tablet Take 1 tablet (10 mg total) by mouth daily. 07/19/19 10/17/19  Skeet Latch, MD  chlorhexidine (PERIDEX) 0.12 % solution 30 mLs by Mouth Rinse route 2 (two) times daily. Swish and spit 10/27/15   [provider]  CLINPRO 5000 1.1 % PSTE Apply 1 application topically at bedtime. Use at bedtime instead of regular toothpaste (brush 2 minutes then spit out. Do not rinse) 10/27/15   [provider]  Cyanocobalamin (VITAMIN B-12 PO) Take  1 tablet by mouth daily.    [provider]  eszopiclone (LUNESTA) 2 MG TABS tablet Take 2 mg by mouth at bedtime as needed for sleep.  03/05/15   [provider]  ferrous sulfate 325 (65 FE) MG tablet Take 325 mg by mouth daily. 11/04/15   [provider]  furosemide (LASIX) 20 MG tablet TAKE 1 TABLET BY MOUTH ONCE A DAY AS NEEDED FOR EDEMA 01/17/18   [provider]  hydrALAZINE (APRESOLINE) 50 MG tablet TAKE 1 TABLET BY MOUTH THREE TIMES A DAY 12/26/19   Lendon Colonel, NP  Multiple Vitamin (MULTIVITAMIN WITH MINERALS) TABS tablet Take 1 tablet by mouth daily.    [provider]  pioglitazone (ACTOS) 30 MG tablet Take 30 mg by mouth  daily. 11/04/15   [provider]  pravastatin (PRAVACHOL) 10 MG tablet TAKE 1 TABLET ONCE A DAY FOR CHOLESTEROL (START 1X PER WEEK) 01/17/18   [provider]    Allergies    Patient has no known allergies.  Review of Systems   Review of Systems  All other systems reviewed and are negative.  Physical Exam Updated Vital Signs BP (!) 208/67 (BP Location: Left Arm)   Pulse 79   Temp 98.2 F (36.8 C) (Oral)   Resp 18   Ht 1.588 m (5' 2.5")   Wt 45.4 kg   SpO2 93%   BMI 18.00 kg/m   Physical Exam Vitals and nursing note reviewed.  Constitutional:      General: She is not in acute distress.    Appearance: She is well-developed.  HENT:     Head: Normocephalic and atraumatic.     Right Ear: External ear normal.     Left Ear: External ear normal.  Eyes:     General: No scleral icterus.       Right eye: No discharge.        Left eye: No discharge.     Conjunctiva/sclera: Conjunctivae normal.  Neck:     Trachea: No tracheal deviation.  Cardiovascular:     Rate and Rhythm: Normal rate and regular rhythm.  Pulmonary:     Effort: Pulmonary effort is normal. No respiratory distress.     Breath sounds: Normal breath sounds. No stridor. No wheezing or rales.  Abdominal:     General: Bowel sounds are normal. There is no distension.     Palpations: Abdomen is soft.     Tenderness: There is no abdominal tenderness. There is no guarding or rebound.  Musculoskeletal:        General: Tenderness present. No deformity.     Cervical back: Neck supple.     Comments: Entire spine nontender, mild tenderness palpation around the shoulder, tenderness palpation of the right elbow, pain with range of motion, skin tear noted of the proximal forearm; mild tenderness palpation around the skin tear over the lateral right knee, no tenderness palpation bilateral hips  Skin:    General: Skin is warm and dry.     Findings: No rash.  Neurological:     General: No focal deficit  present.     Mental Status: She is alert.     Cranial Nerves: No cranial nerve deficit (no facial droop, extraocular movements intact, no slurred speech).     Sensory: No sensory deficit.     Motor: No abnormal muscle tone or seizure activity.     Coordination: Coordination normal.     Comments: Patient is able to grip normally with  her right hand, sensation intact throughout, normal pulses  Psychiatric:        Mood and Affect: Mood normal.    ED Results / Procedures / Treatments   Labs (all labs ordered are listed, but only abnormal results are displayed) Labs Reviewed - No data to display  EKG None  Radiology DG Shoulder Right  Result Date: 04/28/2021 CLINICAL DATA:  Fall, right shoulder pain EXAM: RIGHT SHOULDER - 2+ VIEW COMPARISON:  None. FINDINGS: Normal alignment. No acute fracture or dislocation. Degenerative changes are noted at the acromioclavicular articulation. Glenohumeral joint space is not well profiled. Visualized right hemithorax is unremarkable. IMPRESSION: No acute fracture or dislocation. Electronically Signed   By: Fidela Salisbury M.D.   On: 04/28/2021 19:44   DG Elbow Complete Right  Result Date: 04/28/2021 CLINICAL DATA:  Fall, right elbow pain EXAM: RIGHT ELBOW - COMPLETE 3+ VIEW COMPARISON:  None. FINDINGS: Four view radiograph right elbow demonstrates an acute, transcondylar fracture of the right elbow with mild volar angulation and 1 cortical with volar displacement of the distal fracture fragment. Extensive surrounding soft tissue swelling. Left elbow effusion present. Radiocapitellar and ulnohumeral articulation appear intact. IMPRESSION: Acute mildly angulated and displaced transcondylar fracture. Electronically Signed   By: Fidela Salisbury M.D.   On: 04/28/2021 19:43    Procedures Procedures   Medications Ordered in ED Medications  HYDROcodone-acetaminophen (NORCO/VICODIN) 5-325 MG per tablet 1 tablet (has no administration in time range)    ED Course   I have reviewed the triage vital signs and the nursing notes.  Pertinent labs & imaging results that were available during my care of the patient were reviewed by me and considered in my medical decision making (see chart for details).  Clinical Course as of 04/28/21 2031  Wed Apr 28, 2021  2002 Patient declines knee x-rays.  She states she does not want them her knee is fine [JK]  2012 Shoulder x-ray negative.  Elbow x-ray shows a transcondylar fracture of the elbow [JK]    Clinical Course User Index [JK] Dorie Rank, MD   MDM Rules/Calculators/A&P                           Patient presented to the ED for evaluation after a fall.  Patient is primarily having pain in her elbow.  She did sustain a superficial skin tear in her proximal right forearm as well as her right knee.  I reapproximated the edges of the skin tear.  Wound care was provided.  Nonadhesive dressings were placed.  Patient was also placed in a long-arm splint.  Sling provided.  We will have her follow-up with orthopedics.  No signs of any neurovascular deficit Final Clinical Impression(s) / ED Diagnoses Final diagnoses:  Closed displaced transcondylar fracture of right humerus, initial encounter  Skin abrasion    Rx / DC Orders ED Discharge Orders          Ordered    HYDROcodone-acetaminophen (NORCO/VICODIN) 5-325 MG tablet  Every 6 hours PRN        04/28/21 2030             Dorie Rank, MD 04/28/21 2031

## 2021-04-28 NOTE — Discharge Instructions (Signed)
Apply ice to help with the swelling.  Try to keep your arm elevated.  Keep the splint on is the sling for comfort.  Contact Dr. Trevor Mace office to arrange a follow-up appointment

## 2021-04-28 NOTE — ED Provider Notes (Signed)
Emergency Medicine Provider Triage Evaluation Note  Brandi Mccoy , a 87 y.o. female  was evaluated in triage.  Pt complains of right elbow pain and shoulder pain after a fall.  She was trying to put up her home decorations which tripped and fell.  No head injury.  Does not take any anticoagulants.  Review of Systems  Positive:  Negative: See above   Physical Exam  BP (!) 168/89   Pulse 77   Temp 98.6 F (37 C) (Oral)   Resp 19   Ht 5' 2.5" (1.588 m)   Wt 45.4 kg   SpO2 100%   BMI 18.00 kg/m  Gen:   Awake, no distress   Resp:  Normal effort  MSK:   Moves extremities without difficulty  Other:  Obvious deformity of the right elbow.  There is a deep skin tear to the right forearm and right proximal calf.  Medical Decision Making  Medically screening exam initiated at 7:02 PM.  Appropriate orders placed.  Semaya ELAH AVELLINO was informed that the remainder of the evaluation will be completed by another provider, this initial triage assessment does not replace that evaluation, and the importance of remaining in the ED until their evaluation is complete.     Myna Bright [place], PA-C 04/28/21 Ilsa Iha    Dorie Rank, MD 04/30/21 1440

## 2021-04-29 ENCOUNTER — Telehealth: Payer: Self-pay | Admitting: Orthopaedic Surgery

## 2021-04-29 NOTE — Telephone Encounter (Signed)
Pt's daughter Juliene Pina called back asking if any appts before available before 11/8 please call her at [phone].

## 2021-04-29 NOTE — Telephone Encounter (Signed)
Awesome! Thank you. Will let pt daughter know!

## 2021-04-29 NOTE — Telephone Encounter (Signed)
Pt daughter called and pt was seen in the Ed. She needs to make an appt with Dr.Blackman or gil but I can't get her in until Tuesday. Will that be okay?  CB [phone] Myra

## 2021-04-30 ENCOUNTER — Telehealth: Payer: Self-pay | Admitting: Orthopaedic Surgery

## 2021-04-30 NOTE — Telephone Encounter (Signed)
Patients daughter said they are at [facility] down the road from her home seeing if someone could show them how to do it, I told her if not she could just come on over here and I will show her again

## 2021-04-30 NOTE — Telephone Encounter (Signed)
Patient's daughter called. Says they can not get her sling back on. Would like to know if they can come by to get someone to fix it? [phone]

## 2021-05-04 ENCOUNTER — Ambulatory Visit: Payer: Medicare Other | Admitting: Orthopaedic Surgery

## 2021-05-10 ENCOUNTER — Ambulatory Visit (INDEPENDENT_AMBULATORY_CARE_PROVIDER_SITE_OTHER): Payer: Medicare Other | Admitting: Physician Assistant

## 2021-05-10 ENCOUNTER — Ambulatory Visit: Payer: Self-pay

## 2021-05-10 ENCOUNTER — Other Ambulatory Visit: Payer: Self-pay

## 2021-05-10 ENCOUNTER — Encounter: Payer: Self-pay | Admitting: Physician Assistant

## 2021-05-10 DIAGNOSIS — S42401S Unspecified fracture of lower end of right humerus, sequela: Secondary | ICD-10-CM | POA: Diagnosis not present

## 2021-05-10 DIAGNOSIS — M25521 Pain in right elbow: Secondary | ICD-10-CM | POA: Diagnosis not present

## 2021-05-10 MED ORDER — HYDROCODONE-ACETAMINOPHEN 5-325 MG PO TABS: 1.0000 | ORAL_TABLET | Freq: Four times a day (QID) | ORAL | 0 refills | Status: DC | PRN

## 2021-05-10 NOTE — Progress Notes (Signed)
Office Visit Note   Patient: Brandi Mccoy           Date of Birth: 24-Jun-1922           MRN: [id number] Visit Date: 05/10/2021              Requested by: Jani Gravel, MD [address] [place],  [place] [address] PCP: Jani Gravel, MD   Assessment & Plan: Visit Diagnoses:  1. Pain in right elbow   2. Elbow fracture, right, sequela     Plan: We removed her splint today assessed her lacerations to the forearm.  These are healing well with no signs of infection Xeroform was placed then gauze were placed over the laceration areas.  Let her applied.  Then  cast padding and posterior splint using plaster was applied.  We will see her back in 2 weeks she is to keep splint clean dry intact.  Elevation of the arm above her head through the shoulder coverage.  Also wiggling fingers encouraged.  Taking 2 views of the right elbow at next office visit.  Questions were encouraged and answered. Was given.  Follow-Up Instructions: Return in about 2 weeks (around 05/24/2021) for Radiographs.   Orders:  Orders Placed This Encounter  Procedures   XR Elbow 2 Views Right   No orders of the defined types were placed in this encounter.     Procedures: No procedures performed   Clinical Data: No additional findings.   Subjective: Chief Complaint  Patient presents with   Right Elbow - Fracture    HPI Patient's 87 year old female who fell on 04/28/2021.  She was apparently putting up some decorations when she fell on some acorn-tipped injuring her right elbow.  She was seen in the ER where radiographs showed a transcondylar fracture.  She is placed in a posterior splint and given the a sling for comfort.  She also had lacerations to the right forearm that were addressed.  She comes in today for follow-up.  She been taking mainly Advil for pain.  She denies any loss of consciousness.  She is asking for refill on her hydrocodone for her that she uses for severe pain. Review of  Systems See HPI otherwise negative or noncontributory  Objective: Vital Signs: There were no vitals taken for this visit.  Physical Exam General well-developed pleasant female no acute distress. Ortho Exam Right elbow swelling.  Tenderness at distal humerus.  No attempts at range of motion elbow today.  Lacerations healing well without signs of infection.  Significant swelling in the hand she is able to wiggle her fingers and do thumbs up.  Sensation grossly intact throughout the right hand. Specialty Comments:  No specialty comments available.  Imaging: XR Elbow 2 Views Right  Result Date: 05/10/2021 Right elbow 2 views: Transcondylar fracture without intra-articular involvement.  Slight volar displacement overall alignment slightly improved from prior films.  No other fractures identified.    PMFS History: Patient Active Problem List   Diagnosis Date Noted   L1 vertebral fracture ([place]) 11/14/2015   Compression fracture of lumbar vertebra ([place]) 11/13/2015   Dyspnea 10/10/2013   Palpitations 10/10/2013   INGUINAL HERNIA 04/20/2010   Essential hypertension 03/30/2010   UNSPECIFIED INFECTION OF KIDNEY 03/30/2010   Loss of weight 03/30/2010   DIARRHEA 03/30/2010   Abdominal pain, other specified site 03/30/2010   PERSONAL HX COLONIC POLYPS 03/30/2010   COLONIC POLYPS, ADENOMATOUS 08/21/2007   DM2 (diabetes mellitus, type 2) ([place]) 08/21/2007  Hyperlipidemia LDL goal <70 08/21/2007   DEPRESSION 08/21/2007   GERD 08/21/2007   DIVERTICULOSIS, COLON 08/21/2007   ARTHRITIS 08/21/2007   OSTEOPOROSIS 08/21/2007   Past Medical History:  Diagnosis Date   Arthritis    Bilateral inguinal hernia    Colonic polyp    Adenomatous   Depression    Diabetes mellitus type 2 in nonobese [facility])    Diverticulosis    Colon   GERD (gastroesophageal reflux disease)    H/O: hysterectomy 2003   Hyperlipidemia    Hypertension    Osteoporosis    Tubular adenoma    Shown on colonoscopy  November 2008 and prior colonoscopy in 1998    Family History  Problem Relation Age of Onset   Heart disease Mother     Past Surgical History:  Procedure Laterality Date   APPENDECTOMY     LAPAROTOMY     Social History   Occupational History   Not on file  Tobacco Use   Smoking status: Never   Smokeless tobacco: Never  Substance and Sexual Activity   Alcohol use: Yes   Drug use: No   Sexual activity: Not on file

## 2021-05-24 ENCOUNTER — Ambulatory Visit: Payer: Medicare Other | Admitting: Physician Assistant

## 2021-05-24 ENCOUNTER — Encounter: Payer: Self-pay | Admitting: Physician Assistant

## 2021-05-24 ENCOUNTER — Ambulatory Visit (INDEPENDENT_AMBULATORY_CARE_PROVIDER_SITE_OTHER): Payer: Medicare Other | Admitting: Physician Assistant

## 2021-05-24 ENCOUNTER — Ambulatory Visit (INDEPENDENT_AMBULATORY_CARE_PROVIDER_SITE_OTHER): Payer: Medicare Other

## 2021-05-24 DIAGNOSIS — M25521 Pain in right elbow: Secondary | ICD-10-CM | POA: Diagnosis not present

## 2021-05-24 DIAGNOSIS — S42401S Unspecified fracture of lower end of right humerus, sequela: Secondary | ICD-10-CM | POA: Diagnosis not present

## 2021-05-24 MED ORDER — HYDROCODONE-ACETAMINOPHEN 5-325 MG PO TABS: 1.0000 | ORAL_TABLET | Freq: Four times a day (QID) | ORAL | 0 refills | Status: DC | PRN

## 2021-05-24 NOTE — Progress Notes (Signed)
HPI: [name] returns today almost 1 month status post right elbow fracture.  She has been in a long-arm splint.  She notes decreased swelling bruising right hand.  She is asking for refill on her pain medication.  She had no new falls.  She does note swelling in her right lower leg which she has been this is became worse over the last few weeks.  She states she feels that she hit the leg whenever she injured the elbow.  Physical exam: Right elbow no gross deformity.  Laceration forearm is healed well.  She has good sensation throughout the right hand light touch.  Diminished edema right hand compared to prior examination. Right lower leg brawny changes throughout calf supple nontender.  Maximal tenderness in area of ecchymosis over the dorsal foot and into the second toe.  No gross deformities of the foot.  She has diffuse tenderness throughout the lower right leg and into the dorsal aspect of the midfoot.  Radiographs: 3 views right elbow: Transcondylar fracture with evidence of early callus formation.  No overall change in position alignment.  No other fractures identified.  Impression: Right elbow fracture Right lower leg edema acute on chronic  Plan: Recommend elevation right leg compression hose.  We will monitor her foot and lower leg pain if her pain becomes worse we can definitely obtain radiographs of her right lower leg or foot at next office visit.  Her family was happy with this plan.  Regards to the right elbow we will place her in a long-arm cast.  Elevation wiggling fingers encouraged.  We will see her back in 3 weeks remove the cast and obtain 3 views of the right elbow.  Questions encouraged and answered.  Refill on hydrocodone was given.

## 2021-06-14 ENCOUNTER — Encounter: Payer: Self-pay | Admitting: Physician Assistant

## 2021-06-14 ENCOUNTER — Ambulatory Visit (INDEPENDENT_AMBULATORY_CARE_PROVIDER_SITE_OTHER): Payer: Medicare Other | Admitting: Physician Assistant

## 2021-06-14 ENCOUNTER — Ambulatory Visit (INDEPENDENT_AMBULATORY_CARE_PROVIDER_SITE_OTHER): Payer: Medicare Other

## 2021-06-14 ENCOUNTER — Other Ambulatory Visit: Payer: Self-pay

## 2021-06-14 DIAGNOSIS — S42401S Unspecified fracture of lower end of right humerus, sequela: Secondary | ICD-10-CM

## 2021-06-14 DIAGNOSIS — M25521 Pain in right elbow: Secondary | ICD-10-CM

## 2021-06-14 NOTE — Progress Notes (Signed)
HPI: [name] returns today now 7 weeks status post conservative treatment of the right elbow fracture.  She has been in a long-arm cast.  She states overall she is doing well.  She has no complaints.  Physical exam right elbow she has near full flexion lacks full extension by about 30 degrees.  Limited pronation right forearm.  Lacerations right forearm well-healed.  Right hand is neurovascular intact.  Radiographs: Right elbow 3 views: Considerable consolidation at the fracture site.  No overall change in position alignment.  Elbow is well located.  Impression: Right elbow fracture  Plan: We will have her begin working on gentle range of motion of the elbow.  No lifting with the right arm.  She will work on supination and pronation of the forearm.  When up ambulating she will wear her sling.  We will see her back in 4 weeks and obtain 3 views of the right elbow at that time.  Questions were encouraged and answered both patient and her daughters present today.

## 2021-06-28 DIAGNOSIS — Z23 Encounter for immunization: Secondary | ICD-10-CM | POA: Diagnosis not present

## 2021-07-12 ENCOUNTER — Ambulatory Visit (INDEPENDENT_AMBULATORY_CARE_PROVIDER_SITE_OTHER): Payer: Medicare Other | Admitting: Physician Assistant

## 2021-07-12 ENCOUNTER — Encounter: Payer: Self-pay | Admitting: Physician Assistant

## 2021-07-12 ENCOUNTER — Other Ambulatory Visit: Payer: Self-pay

## 2021-07-12 ENCOUNTER — Ambulatory Visit: Payer: Self-pay

## 2021-07-12 DIAGNOSIS — S42401S Unspecified fracture of lower end of right humerus, sequela: Secondary | ICD-10-CM

## 2021-07-12 DIAGNOSIS — M25521 Pain in right elbow: Secondary | ICD-10-CM

## 2021-07-12 NOTE — Progress Notes (Signed)
HPI: [name] returns today with her daughter due to her right elbow fracture.  She states overall that she is slowly improving.  She states she still has some tenderness about the elbow.  No new injuries.  Physical exam: Right elbow she has near full extension lacks a few degrees of flexion.  Has full supination pronation forearm.  There is small new laceration over the posterior aspect the elbow and the olecranon region.  No signs of infection.  Radiographs: Right elbow 3 views: Transcondylar fracture shows significant consolidation.  Fracture site seen slightly at this point in time.  No change in overall position alignment.  Impression: Right elbow transcondylar fracture  Plan: Recommend no heavy lifting more than 5 pounds over the next 2 months and then activity as tolerated.  She will continue work on range of motion strengthening of the right arm.  Did offer therapy she defers.  In regards to the small laceration she will apply Xeroform over this area and applied a gauze and Ace bandage which was applied today.  She will leave this on for 2 days at a time and then wash the area completely recommend she do this for 1 week if this does not heal she will return.  She has any questions concerns she will return for further follow-up of the elbow.  Would not recommend any further radiographs as the fracture is almost completely nonvisible.  Explained this to the patient and her daughters present throughout the examination today.

## 2021-07-28 DIAGNOSIS — D509 Iron deficiency anemia, unspecified: Secondary | ICD-10-CM | POA: Diagnosis not present

## 2021-07-28 DIAGNOSIS — R5381 Other malaise: Secondary | ICD-10-CM | POA: Diagnosis not present

## 2021-07-28 DIAGNOSIS — R0989 Other specified symptoms and signs involving the circulatory and respiratory systems: Secondary | ICD-10-CM | POA: Diagnosis not present

## 2021-07-28 DIAGNOSIS — E1165 Type 2 diabetes mellitus with hyperglycemia: Secondary | ICD-10-CM | POA: Diagnosis not present

## 2021-07-28 DIAGNOSIS — I10 Essential (primary) hypertension: Secondary | ICD-10-CM | POA: Diagnosis not present

## 2021-07-28 DIAGNOSIS — F418 Other specified anxiety disorders: Secondary | ICD-10-CM | POA: Diagnosis not present

## 2021-07-28 DIAGNOSIS — E7800 Pure hypercholesterolemia, unspecified: Secondary | ICD-10-CM | POA: Diagnosis not present

## 2021-07-28 DIAGNOSIS — F5101 Primary insomnia: Secondary | ICD-10-CM | POA: Diagnosis not present

## 2021-08-10 ENCOUNTER — Other Ambulatory Visit: Payer: Self-pay

## 2021-08-10 DIAGNOSIS — M25471 Effusion, right ankle: Secondary | ICD-10-CM | POA: Diagnosis not present

## 2021-08-10 DIAGNOSIS — M25571 Pain in right ankle and joints of right foot: Secondary | ICD-10-CM

## 2021-08-11 ENCOUNTER — Ambulatory Visit
Admission: RE | Admit: 2021-08-11 | Discharge: 2021-08-11 | Disposition: A | Discharge status: Home / Self Care | Payer: Medicare Other | Source: Ambulatory Visit

## 2021-08-11 DIAGNOSIS — M25571 Pain in right ankle and joints of right foot: Secondary | ICD-10-CM

## 2021-08-11 DIAGNOSIS — M25471 Effusion, right ankle: Secondary | ICD-10-CM

## 2021-08-11 DIAGNOSIS — M7121 Synovial cyst of popliteal space [Baker], right knee: Secondary | ICD-10-CM | POA: Diagnosis not present

## 2021-08-11 DIAGNOSIS — R600 Localized edema: Secondary | ICD-10-CM | POA: Diagnosis not present

## 2021-08-31 DIAGNOSIS — L439 Lichen planus, unspecified: Secondary | ICD-10-CM | POA: Diagnosis not present

## 2021-10-19 DIAGNOSIS — Z85828 Personal history of other malignant neoplasm of skin: Secondary | ICD-10-CM | POA: Diagnosis not present

## 2021-10-19 DIAGNOSIS — C44629 Squamous cell carcinoma of skin of left upper limb, including shoulder: Secondary | ICD-10-CM | POA: Diagnosis not present

## 2022-03-15 DIAGNOSIS — E7800 Pure hypercholesterolemia, unspecified: Secondary | ICD-10-CM | POA: Diagnosis not present

## 2022-03-15 DIAGNOSIS — R5383 Other fatigue: Secondary | ICD-10-CM | POA: Diagnosis not present

## 2022-03-15 DIAGNOSIS — M25562 Pain in left knee: Secondary | ICD-10-CM | POA: Diagnosis not present

## 2022-03-15 DIAGNOSIS — E1165 Type 2 diabetes mellitus with hyperglycemia: Secondary | ICD-10-CM | POA: Diagnosis not present

## 2022-03-15 DIAGNOSIS — R600 Localized edema: Secondary | ICD-10-CM | POA: Diagnosis not present

## 2022-03-15 DIAGNOSIS — F5101 Primary insomnia: Secondary | ICD-10-CM | POA: Diagnosis not present

## 2022-03-15 DIAGNOSIS — M25462 Effusion, left knee: Secondary | ICD-10-CM | POA: Diagnosis not present

## 2022-03-15 DIAGNOSIS — M25561 Pain in right knee: Secondary | ICD-10-CM | POA: Diagnosis not present

## 2022-03-15 LAB — LAB REPORT - SCANNED: EGFR: 30

## 2022-03-18 DIAGNOSIS — I447 Left bundle-branch block, unspecified: Secondary | ICD-10-CM | POA: Diagnosis not present

## 2022-03-18 DIAGNOSIS — I10 Essential (primary) hypertension: Secondary | ICD-10-CM | POA: Diagnosis not present

## 2022-03-18 DIAGNOSIS — I509 Heart failure, unspecified: Secondary | ICD-10-CM | POA: Diagnosis not present

## 2022-03-21 DIAGNOSIS — E7800 Pure hypercholesterolemia, unspecified: Secondary | ICD-10-CM | POA: Diagnosis not present

## 2022-03-21 DIAGNOSIS — F5101 Primary insomnia: Secondary | ICD-10-CM | POA: Diagnosis not present

## 2022-03-21 DIAGNOSIS — M25561 Pain in right knee: Secondary | ICD-10-CM | POA: Diagnosis not present

## 2022-03-21 DIAGNOSIS — M25562 Pain in left knee: Secondary | ICD-10-CM | POA: Diagnosis not present

## 2022-03-21 DIAGNOSIS — M25462 Effusion, left knee: Secondary | ICD-10-CM | POA: Diagnosis not present

## 2022-03-21 DIAGNOSIS — I447 Left bundle-branch block, unspecified: Secondary | ICD-10-CM | POA: Diagnosis not present

## 2022-03-21 DIAGNOSIS — I509 Heart failure, unspecified: Secondary | ICD-10-CM | POA: Diagnosis not present

## 2022-03-21 DIAGNOSIS — E1165 Type 2 diabetes mellitus with hyperglycemia: Secondary | ICD-10-CM | POA: Diagnosis not present

## 2022-03-21 DIAGNOSIS — R600 Localized edema: Secondary | ICD-10-CM | POA: Diagnosis not present

## 2022-03-21 DIAGNOSIS — R5383 Other fatigue: Secondary | ICD-10-CM | POA: Diagnosis not present

## 2022-04-04 ENCOUNTER — Ambulatory Visit (INDEPENDENT_AMBULATORY_CARE_PROVIDER_SITE_OTHER): Payer: Medicare Other

## 2022-04-04 ENCOUNTER — Ambulatory Visit: Payer: Medicare Other | Admitting: Physician Assistant

## 2022-04-04 ENCOUNTER — Encounter: Payer: Self-pay | Admitting: Physician Assistant

## 2022-04-04 ENCOUNTER — Ambulatory Visit (INDEPENDENT_AMBULATORY_CARE_PROVIDER_SITE_OTHER): Payer: Medicare Other | Admitting: Physician Assistant

## 2022-04-04 DIAGNOSIS — G8929 Other chronic pain: Secondary | ICD-10-CM

## 2022-04-04 DIAGNOSIS — M25562 Pain in left knee: Secondary | ICD-10-CM | POA: Diagnosis not present

## 2022-04-04 NOTE — Progress Notes (Signed)
HPI: [name] comes in today with her daughter for left knee pain.  She was seen by her primary care physician few weeks ago and was given prednisone Dosepak which really helped with knee pain.  However she continues to have swelling left knee.  Denies any mechanical symptoms left knee.  Denies any acute injury to the knee.  Review of systems see HPI otherwise negative  Physical exam: General: Well-developed well nourished female seated in wheelchair no acute distress. Psych: Alert and oriented x3 Bilateral knees: Good range of motion of both knees.  Left knee patellofemoral crepitus with passive range of motion.  No abnormal warmth erythema of either knee.  Left knee with slight effusion.  No instability valgus varus stressing of either knee.  Radiographs: Left knee 2 views shows no acute fractures.  Knee is well located.  Chondral calcinosis medial lateral meniscus.  Medial lateral joint lines well-preserved.  No bony abnormalities otherwise.  Impression: Left knee pain Left knee effusion  Plan: Discussed radiographic findings clinical findings with patient and her daughter.  At this point time the light to do the most conservative treatment and she is having no real pain in the knee at this point time does some fluid collection and does not feel it needs super tired at this point time.  Therefore I have her work on Forensic scientist.  If she develops mechanical symptoms or if the swelling becomes such that is causing her pain or decreased range of motion she will call our office and come back in for possible aspiration and injection with cortisone left knee.

## 2022-04-20 NOTE — Progress Notes (Unsigned)
Cardiology Office Note:    Date:  05/04/2022   ID:  Brandi Mccoy, DOB 1921-12-08, MRN [id number]  PCP:  Jani Gravel, MD   [place] Prov[facility] Cardiologist:  Conor Filsaime Martinique, MD     Referring MD: Jani Gravel, MD   Chief Complaint  Patient presents with   Congestive Heart Failure   Hypertension    History of Present Illness:    Brandi Mccoy is a 88 y.o. female seen at the request of Dr Ashby Dawes for evaluation of CHF. She has a history of DM, HLD, and HTN. Has a chronic LBBB. Last seen by me in 2015. Was seen for HTN management in 2020. Was seen in 2015 for dyspnea and palpitation.  Holter monitor at the time showed sinus bradycardia and sinus tachycardia, first-degree AV block and PACs, no atrial fibrillation.  Echocardiogram showed asymmetric septal hypertrophy and a normal LV function, mild MR. Was seen on 05/19/2015.  Her blood pressure was over 200 at the time.  Low-dose carvedilol was added for her palpitation, she was quite stressed out and has chronic anxiety issues.  Repeat echocardiogram at the time continue to show mild aortic stenosis, no other significant finding.  Myoview obtained on 05/27/2015 showed EF 64%, small sized mild intensity fixed apical and distal anterior apical defect likely related to left bundle branch block, no significant reversible ischemia.  Overall considered a low risk study. Repeat echocardiogram obtained on 02/25/2019 showed EF 60 to 65%, mild to moderate LAE, moderate calcification of the mitral valve, mild aortic stenosis.  She is seen with her daughter today. In September she was seen for evaluation of increased edema. She had her lasix doubled for one week. Actos was discontinued. Started on Farxiga. BNP was elevated to 3660. Since then she has improved. Edema is better. Still has some SOB intermittently but denies PND, orthopnea, chest pain, palpitations, dizziness. She is listing in past and by primary care as taking hydralazine but  daughter states she is not taking this.   Past Medical History:  Diagnosis Date   Arthritis    Bilateral inguinal hernia    Colonic polyp    Adenomatous   Depression    Diabetes mellitus type 2 in nonobese [facility])    Diverticulosis    Colon   GERD (gastroesophageal reflux disease)    H/O: hysterectomy 2003   Hyperlipidemia    Hypertension    Osteoporosis    Tubular adenoma    Shown on colonoscopy November 2008 and prior colonoscopy in 1998    Past Surgical History:  Procedure Laterality Date   APPENDECTOMY     LAPAROTOMY      Current Medications: Current Meds  Medication Sig   amLODipine (NORVASC) 5 MG tablet Take 5 mg by mouth daily.   Cyanocobalamin (VITAMIN B-12 PO) Take 1 tablet by mouth daily.   FARXIGA 5 MG TABS tablet Take 5 mg by mouth daily.   ferrous sulfate 325 (65 FE) MG tablet Take 325 mg by mouth daily.   furosemide (LASIX) 20 MG tablet TAKE 1 TABLET BY MOUTH ONCE A DAY AS NEEDED FOR EDEMA   Multiple Vitamin (MULTIVITAMIN WITH MINERALS) TABS tablet Take 1 tablet by mouth daily.   pravastatin (PRAVACHOL) 10 MG tablet TAKE 1 TABLET ONCE A DAY FOR CHOLESTEROL (START 1X PER WEEK)   [DISCONTINUED] chlorhexidine (PERIDEX) 0.12 % solution 30 mLs by Mouth Rinse route 2 (two) times daily. Swish and spit   [DISCONTINUED] CLINPRO 5000 1.1 % PSTE Apply 1  application topically at bedtime. Use at bedtime instead of regular toothpaste (brush 2 minutes then spit out. Do not rinse)   [DISCONTINUED] hydrALAZINE (APRESOLINE) 50 MG tablet TAKE 1 TABLET BY MOUTH THREE TIMES A DAY     Allergies:   Patient has no known allergies.   Social History   Socioeconomic History   Marital status: Married    Spouse name: Not on file   Number of children: 2   Years of education: Not on file   Highest education level: Not on file  Occupational History   Not on file  Tobacco Use   Smoking status: Never   Smokeless tobacco: Never  Substance and Sexual Activity   Alcohol use: Yes    Drug use: No   Sexual activity: Not on file  Other Topics Concern   Not on file  Social History Narrative   Not on file   Social Determinants of Health   Financial Resource Strain: Not on file  Food Insecurity: Not on file  Transportation Needs: Not on file  Physical Activity: Not on file  Stress: Not on file  Social Connections: Not on file     Family History: The patient's family history includes Heart disease in her mother.  ROS:   Please see the history of present illness.     All other systems reviewed and are negative.  EKGs/Labs/Other Studies Reviewed:    The following studies were reviewed today: Myoview 05/27/2015 Study Highlights   The left ventricular ejection fraction is normal (55-65%). Nuclear stress EF: 64%. No T wave inversion was noted during stress. There was no ST segment deviation noted during stress. Defect 1: There is a small defect of mild severity.   Small-size, mild intensity mostly fixed apical, distal anteroapical/anteroseptal artifact - likely related to LBBB. No significant reversible ischemia. LVEF 64% - normal wall motion. This is a low risk study.      Echo 02/25/2019 IMPRESSIONS      1. The left ventricle has normal systolic function with an ejection fraction of 60-65%. The cavity size was normal. There is mildly increased left ventricular wall thickness. Left ventricular diastolic Doppler parameters are indeterminate.  2. The right ventricle has normal systolic function. The cavity was normal. There is no increase in right ventricular wall thickness.  3. Left atrial size was mild-moderately dilated.  4. The mitral valve is degenerative. Moderate thickening of the mitral valve leaflet. Moderate calcification of the mitral valve leaflet. There is severe mitral annular calcification present.  5. Severe posterior annular calcification extending sub chordally.  6. The aortic valve was not well visualized. Moderate thickening of the aortic  valve. Moderate calcification of the aortic valve. Mild stenosis of the aortic valve.  7. The aorta is normal unless otherwise noted.     EKG:  EKG is  ordered today.  The ekg ordered today demonstrates NSR rate 53 with first degree AV block and LBBB. I have personally reviewed and interpreted this study.   Recent Labs: No results found for requested labs within last 365 days.  Recent Lipid Panel No results found for: "CHOL", "TRIG", "HDL", "CHOLHDL", "VLDL", "LDLCALC", "LDLDIRECT"  Dated 01/25/21: cholesterol 210, triglycerides 154, HDL 62. nonHDL 148.  Dated 07/28/21: A1c 6.3%. GFR 35. TSH normal  Labs reviewed from PCP under Media tab.  Risk Assessment/Calculations:      HYPERTENSION CONTROL Vitals:   05/04/22 1548 05/04/22 1616  BP: (!) 195/55 (!) 175/60    The patient's blood pressure is elevated  above target today.  In order to address the patient's elevated BP: A new medication was prescribed today.            Physical Exam:    VS:  BP (!) 175/60   Pulse (!) 53   Ht 5' 2.5" (1.588 m)   Wt 108 lb (49 kg)   SpO2 99%   BMI 19.44 kg/m     Wt Readings from Last 3 Encounters:  05/04/22 108 lb (49 kg)  04/28/21 100 lb (45.4 kg)  05/27/19 120 lb (54.4 kg)     GEN: elderly WF in NAD Well nourished HEENT: Normal NECK: No JVD; No carotid bruits LYMPHATICS: No lymphadenopathy CARDIAC: RRR, soft 1/6 systolic murmur, no rubs, gallops RESPIRATORY:  Clear to auscultation without rales, wheezing or rhonchi  ABDOMEN: Soft, non-tender, non-distended MUSCULOSKELETAL:  trace right ankle edema; No deformity  SKIN: Warm and dry NEUROLOGIC:  Alert and oriented x 3 PSYCHIATRIC:  Normal affect   ASSESSMENT:    1. Acute on chronic diastolic congestive heart failure ([place])   2. Resistant hypertension   3. Stage 3b chronic kidney disease (HCC)    PLAN:    In order of problems listed above:  Acute on chronic diastolic CHF. Patient has improved with change in medication  from Actos to [place]. Appears close to euvolemic on current lasix dose 20 mg daily. CHF exacerbated by CKD. Stressed importance of sodium restriction. Continue amlodipine and lasix. Add Hydralazine for BP control. Follow up  in 3 months. I don't feel further cardiac testing is needed now.  Resistant HTN. Avoid ARB,ACEi, Aldactone due to CKD. Will resume hydralazine 50 mg tid.  CKD stage 3b. Close to stage IV. Avoid NSAIDs.  HLD on statin.            Medication Adjustments/Labs and Tests Ordered: Current medicines are reviewed at length with the patient today.  Concerns regarding medicines are outlined above.  Orders Placed This Encounter  Procedures   EKG 12-Lead   Meds ordered this encounter  Medications   hydrALAZINE (APRESOLINE) 50 MG tablet    Sig: Take 1 tablet (50 mg total) by mouth 3 (three) times daily.    Dispense:  270 tablet    Refill:  3    Patient Instructions  Restrict your salt intake  Continue your current medication but resume hydralazine 50 mg three times a day for blood pressure  Follow up in 3 months.      Signed, Jenita Rayfield Martinique, MD  05/04/2022 4:18 PM    [place]

## 2022-05-04 ENCOUNTER — Ambulatory Visit: Payer: Medicare Other | Attending: Cardiology | Admitting: Cardiology

## 2022-05-04 ENCOUNTER — Encounter: Payer: Self-pay | Admitting: Cardiology

## 2022-05-04 VITALS — BP 175/60 | HR 53 | Ht 62.5 in | Wt 108.0 lb

## 2022-05-04 DIAGNOSIS — I1A0 Resistant hypertension: Secondary | ICD-10-CM | POA: Diagnosis not present

## 2022-05-04 DIAGNOSIS — N1832 Chronic kidney disease, stage 3b: Secondary | ICD-10-CM | POA: Insufficient documentation

## 2022-05-04 DIAGNOSIS — I5033 Acute on chronic diastolic (congestive) heart failure: Secondary | ICD-10-CM | POA: Insufficient documentation

## 2022-05-04 MED ORDER — HYDRALAZINE HCL 50 MG PO TABS: 50.0000 mg | ORAL_TABLET | Freq: Three times a day (TID) | ORAL | 3 refills | Status: AC

## 2022-05-04 NOTE — Patient Instructions (Addendum)
Restrict your salt intake  Continue your current medication but resume hydralazine 50 mg three times a day for blood pressure  Follow up in 3 months.

## 2022-06-02 DIAGNOSIS — Z0489 Encounter for examination and observation for other specified reasons: Secondary | ICD-10-CM | POA: Diagnosis not present

## 2022-06-09 DIAGNOSIS — Z23 Encounter for immunization: Secondary | ICD-10-CM | POA: Diagnosis not present

## 2022-06-16 DIAGNOSIS — H4720 Unspecified optic atrophy: Secondary | ICD-10-CM | POA: Diagnosis not present

## 2022-06-16 DIAGNOSIS — Z961 Presence of intraocular lens: Secondary | ICD-10-CM | POA: Diagnosis not present

## 2022-06-16 DIAGNOSIS — H47033 Optic nerve hypoplasia, bilateral: Secondary | ICD-10-CM | POA: Diagnosis not present

## 2022-06-16 DIAGNOSIS — H3530 Unspecified macular degeneration: Secondary | ICD-10-CM | POA: Diagnosis not present

## 2022-06-16 DIAGNOSIS — H53143 Visual discomfort, bilateral: Secondary | ICD-10-CM | POA: Diagnosis not present

## 2022-06-16 DIAGNOSIS — H40053 Ocular hypertension, bilateral: Secondary | ICD-10-CM | POA: Diagnosis not present

## 2022-06-16 DIAGNOSIS — H5203 Hypermetropia, bilateral: Secondary | ICD-10-CM | POA: Diagnosis not present

## 2022-06-16 DIAGNOSIS — H52223 Regular astigmatism, bilateral: Secondary | ICD-10-CM | POA: Diagnosis not present

## 2022-06-16 DIAGNOSIS — H40013 Open angle with borderline findings, low risk, bilateral: Secondary | ICD-10-CM | POA: Diagnosis not present

## 2022-06-16 DIAGNOSIS — H353131 Nonexudative age-related macular degeneration, bilateral, early dry stage: Secondary | ICD-10-CM | POA: Diagnosis not present

## 2022-06-16 DIAGNOSIS — E119 Type 2 diabetes mellitus without complications: Secondary | ICD-10-CM | POA: Diagnosis not present

## 2022-06-16 DIAGNOSIS — Z9849 Cataract extraction status, unspecified eye: Secondary | ICD-10-CM | POA: Diagnosis not present

## 2022-07-26 NOTE — Progress Notes (Signed)
Cardiology Office Note:    Date:  08/01/2022   ID:  Brandi Mccoy, DOB [birth year], MRN [id number]  PCP:  Jani Gravel, MD   [place] Prov[facility] Cardiologist:  Alira Fretwell Martinique, MD     Referring MD: Jani Gravel, MD   Chief Complaint  Patient presents with   Congestive Heart Failure    History of Present Illness:    Brandi Mccoy is a 89 y.o. female seen at the request of Dr Ashby Dawes for evaluation of CHF. She has a history of DM, HLD, and HTN. Has a chronic LBBB. Last seen by me in 2015. Was seen for HTN management in 2020. Was seen in 2015 for dyspnea and palpitation.  Holter monitor at the time showed sinus bradycardia and sinus tachycardia, first-degree AV block and PACs, no atrial fibrillation.  Echocardiogram showed asymmetric septal hypertrophy and a normal LV function, mild MR. Was seen on 05/19/2015.  Her blood pressure was over 200 at the time.  Low-dose carvedilol was added for her palpitation, she was quite stressed out and has chronic anxiety issues.  Repeat echocardiogram at the time continue to show mild aortic stenosis, no other significant finding.  Myoview obtained on 05/27/2015 showed EF 64%, small sized mild intensity fixed apical and distal anterior apical defect likely related to left bundle branch block, no significant reversible ischemia.  Overall considered a low risk study. Repeat echocardiogram obtained on 02/25/2019 showed EF 60 to 65%, mild to moderate LAE, moderate calcification of the mitral valve, mild aortic stenosis.  She is seen with her daughter today. In September she was seen for evaluation of increased edema. She had her lasix doubled for one week. Actos was discontinued. Started on Farxiga. BNP was elevated to 3660. Since then she has improved. Edema is better. Denies PND, orthopnea, chest pain, palpitations, dizziness. No SOB.   Past Medical History:  Diagnosis Date   Arthritis    Bilateral inguinal hernia    Colonic polyp    Adenomatous    Depression    Diabetes mellitus type 2 in nonobese [facility])    Diverticulosis    Colon   GERD (gastroesophageal reflux disease)    H/O: hysterectomy 2003   Hyperlipidemia    Hypertension    Osteoporosis    Tubular adenoma    Shown on colonoscopy November 2008 and prior colonoscopy in 1998    Past Surgical History:  Procedure Laterality Date   APPENDECTOMY     LAPAROTOMY      Current Medications: Current Meds  Medication Sig   amLODipine (NORVASC) 5 MG tablet Take 5 mg by mouth daily.   Cyanocobalamin (VITAMIN B-12 PO) Take 1 tablet by mouth daily.   dexamethasone (DECADRON) 0.5 MG/5ML solution Take 1.5 mg by mouth 4 (four) times daily.   eszopiclone (LUNESTA) 2 MG TABS tablet Take 2 mg by mouth at bedtime.   FARXIGA 5 MG TABS tablet Take 5 mg by mouth daily.   ferrous sulfate 325 (65 FE) MG tablet Take 325 mg by mouth daily.   furosemide (LASIX) 20 MG tablet TAKE 1 TABLET BY MOUTH ONCE A DAY AS NEEDED FOR EDEMA   hydrALAZINE (APRESOLINE) 50 MG tablet Take 1 tablet (50 mg total) by mouth 3 (three) times daily.   Multiple Vitamin (MULTIVITAMIN WITH MINERALS) TABS tablet Take 1 tablet by mouth daily.   pravastatin (PRAVACHOL) 10 MG tablet TAKE 1 TABLET ONCE A DAY FOR CHOLESTEROL (START 1X PER WEEK)     Allergies:   Patient  has no known allergies.   Social History   Socioeconomic History   Marital status: Married    Spouse name: Not on file   Number of children: 2   Years of education: Not on file   Highest education level: Not on file  Occupational History   Not on file  Tobacco Use   Smoking status: Never   Smokeless tobacco: Never  Substance and Sexual Activity   Alcohol use: Yes   Drug use: No   Sexual activity: Not on file  Other Topics Concern   Not on file  Social History Narrative   Not on file   Social Determinants of Health   Financial Resource Strain: Not on file  Food Insecurity: Not on file  Transportation Needs: Not on file  Physical Activity: Not  on file  Stress: Not on file  Social Connections: Not on file     Family History: The patient's family history includes Heart disease in her mother.  ROS:   Please see the history of present illness.     All other systems reviewed and are negative.  EKGs/Labs/Other Studies Reviewed:    The following studies were reviewed today: Myoview 05/27/2015 Study Highlights   The left ventricular ejection fraction is normal (55-65%). Nuclear stress EF: 64%. No T wave inversion was noted during stress. There was no ST segment deviation noted during stress. Defect 1: There is a small defect of mild severity.   Small-size, mild intensity mostly fixed apical, distal anteroapical/anteroseptal artifact - likely related to LBBB. No significant reversible ischemia. LVEF 64% - normal wall motion. This is a low risk study.      Echo 02/25/2019 IMPRESSIONS      1. The left ventricle has normal systolic function with an ejection fraction of 60-65%. The cavity size was normal. There is mildly increased left ventricular wall thickness. Left ventricular diastolic Doppler parameters are indeterminate.  2. The right ventricle has normal systolic function. The cavity was normal. There is no increase in right ventricular wall thickness.  3. Left atrial size was mild-moderately dilated.  4. The mitral valve is degenerative. Moderate thickening of the mitral valve leaflet. Moderate calcification of the mitral valve leaflet. There is severe mitral annular calcification present.  5. Severe posterior annular calcification extending sub chordally.  6. The aortic valve was not well visualized. Moderate thickening of the aortic valve. Moderate calcification of the aortic valve. Mild stenosis of the aortic valve.  7. The aorta is normal unless otherwise noted.     EKG:  EKG is not ordered today.    Recent Labs: No results found for requested labs within last 365 days.  Recent Lipid Panel No results found for:  "CHOL", "TRIG", "HDL", "CHOLHDL", "VLDL", "LDLCALC", "LDLDIRECT"  Dated 01/25/21: cholesterol 210, triglycerides 154, HDL 62. nonHDL 148.  Dated 07/28/21: A1c 6.3%. GFR 35. TSH normal  Labs reviewed from PCP under Media tab.  Risk Assessment/Calculations:                Physical Exam:    VS:  BP 138/80   Pulse 70   Ht 5' 2.5" (1.588 m)   Wt 101 lb 6.4 oz (46 kg)   SpO2 96%   BMI 18.25 kg/m     Wt Readings from Last 3 Encounters:  08/01/22 101 lb 6.4 oz (46 kg)  05/04/22 108 lb (49 kg)  04/28/21 100 lb (45.4 kg)     GEN: elderly WF in NAD Well nourished HEENT: Normal NECK:  No JVD; No carotid bruits LYMPHATICS: No lymphadenopathy CARDIAC: RRR, soft 1/6 systolic murmur, no rubs, gallops RESPIRATORY:  Clear to auscultation without rales, wheezing or rhonchi  ABDOMEN: Soft, non-tender, non-distended MUSCULOSKELETAL:  trace right ankle edema; No deformity  SKIN: Warm and dry NEUROLOGIC:  Alert and oriented x 3 PSYCHIATRIC:  Normal affect   ASSESSMENT:    1. Resistant hypertension   2. Chronic diastolic CHF (congestive heart failure) (HCC)   3. Stage 3b chronic kidney disease (HCC)     PLAN:    In order of problems listed above:  Chronic diastolic CHF. Patient has improved with change in medication from Actos to [place]. Appears close to euvolemic on current lasix dose 20 mg daily. CHF exacerbated by CKD. Stressed importance of sodium restriction. Continue amlodipine, hydralazine, and lasix.  Resistant HTN. Avoid ARB,ACEi, Aldactone due to CKD. Continue current therapy CKD stage 3b. Close to stage IV. Avoid NSAIDs. Will repeat BMET and BNP today HLD on statin.            Medication Adjustments/Labs and Tests Ordered: Current medicines are reviewed at length with the patient today.  Concerns regarding medicines are outlined above.  No orders of the defined types were placed in this encounter.  No orders of the defined types were placed in this  encounter.   Patient Instructions  Medication Instructions:   *If you need a refill on your cardiac medications before your next appointment, please call your pharmacy*   Lab Work:  If you have labs (blood work) drawn today and your tests are completely normal, you will receive your results only by: [place] (if you have MyChart) OR A paper copy in the mail If you have any lab test that is abnormal or we need to change your treatment, we will call you to review the results.   Testing/Procedures: None    Follow-Up: At [facility], you and your health needs are our priority.  As part of our continuing mission to provide you with exceptional heart care, we have created designated Provider Care Teams.  These Care Teams include your primary Cardiologist (physician) and Advanced Practice Providers (APPs -  Physician Assistants and Nurse Practitioners) who all work together to provide you with the care you need, when you need it.  We recommend signing up for the patient portal called "MyChart".  Sign up information is provided on this After Visit Summary.  MyChart is used to connect with patients for Virtual Visits (Telemedicine).  Patients are able to view lab/test results, encounter notes, upcoming appointments, etc.  Non-urgent messages can be sent to your provider as well.   To learn more about what you can do with MyChart, go to [email or web address].    Your next appointment:   6 month(s)  Provider:   APP or Dr Martinique     Signed, Donnie Gedeon Martinique, MD  08/01/2022 4:15 PM    [place]

## 2022-08-01 ENCOUNTER — Ambulatory Visit: Payer: Medicare Other | Attending: Cardiology | Admitting: Cardiology

## 2022-08-01 ENCOUNTER — Encounter: Payer: Self-pay | Admitting: Cardiology

## 2022-08-01 VITALS — BP 138/80 | HR 70 | Ht 62.5 in | Wt 101.4 lb

## 2022-08-01 DIAGNOSIS — N1832 Chronic kidney disease, stage 3b: Secondary | ICD-10-CM

## 2022-08-01 DIAGNOSIS — I5032 Chronic diastolic (congestive) heart failure: Secondary | ICD-10-CM | POA: Diagnosis not present

## 2022-08-01 DIAGNOSIS — I1A0 Resistant hypertension: Secondary | ICD-10-CM

## 2022-08-01 NOTE — Patient Instructions (Signed)
Medication Instructions:   Your physician recommends that you continue on your current medications as directed. Please refer to the Current Medication list given to you today.  *If you need a refill on your cardiac medications before your next appointment, please call your pharmacy*  Lab Work: Your physician recommends that you return for lab work TODAY:  BMP BNP  If you have labs (blood work) drawn today and your tests are completely normal, you will receive your results only by: [place] (if you have MyChart) OR A paper copy in the mail If you have any lab test that is abnormal or we need to change your treatment, we will call you to review the results.  Testing/Procedures: NONE ordered at this time of appointment   Follow-Up: At [facility], you and your health needs are our priority.  As part of our continuing mission to provide you with exceptional heart care, we have created designated Provider Care Teams.  These Care Teams include your primary Cardiologist (physician) and Advanced Practice Providers (APPs -  Physician Assistants and Nurse Practitioners) who all work together to provide you with the care you need, when you need it.  We recommend signing up for the patient portal called "MyChart".  Sign up information is provided on this After Visit Summary.  MyChart is used to connect with patients for Virtual Visits (Telemedicine).  Patients are able to view lab/test results, encounter notes, upcoming appointments, etc.  Non-urgent messages can be sent to your provider as well.   To learn more about what you can do with MyChart, go to [email or web address].    Your next appointment:   6 month(s)  Provider:   Peter Martinique, MD     Other Instructions

## 2022-08-02 ENCOUNTER — Telehealth: Payer: Self-pay

## 2022-08-02 ENCOUNTER — Telehealth: Payer: Self-pay | Admitting: Physician Assistant

## 2022-08-02 LAB — BASIC METABOLIC PANEL
BUN/Creatinine Ratio: 14 (ref 12–28)
BUN: 28 mg/dL (ref 10–36)
CO2: 23 mmol/L (ref 20–29)
Calcium: 10.1 mg/dL (ref 8.7–10.3)
Chloride: 108 mmol/L — ABNORMAL HIGH (ref 96–106)
Creatinine, Ser: 1.94 mg/dL — ABNORMAL HIGH (ref 0.57–1.00)
Glucose: 97 mg/dL (ref 70–99)
Potassium: 5.8 mmol/L (ref 3.5–5.2)
Sodium: 143 mmol/L (ref 134–144)
eGFR: 23 mL/min/{1.73_m2} — ABNORMAL LOW (ref >59)

## 2022-08-02 LAB — BRAIN NATRIURETIC PEPTIDE: BNP: 224 pg/mL — ABNORMAL HIGH (ref 0.0–100.0)

## 2022-08-02 NOTE — Telephone Encounter (Signed)
Left voice mail with daughter Juliene Pina to call back regarding blood work needed today.

## 2022-08-02 NOTE — Telephone Encounter (Addendum)
Called and left a voice message asking patient to give office a call back for lab results. Will try calling again.  ----- Message from Peter M Martinique, MD sent at 08/02/2022  7:25 AM EST ----- The following abnormalities are noted:  potassium is high. Renal function is a little worse.  All other values are normal, stable or within acceptable limits. Medication changes / Follow up labs / Other changes or recommendations:   Needs repeat BMET to look at potassium to make sure this isn't lab error. Otherwise continue Rx  Peter Martinique, MD 08/02/2022 7:23 AM

## 2022-08-02 NOTE — Telephone Encounter (Signed)
Got report from overnight fellow Dr. Humphrey Rolls that lab called with critical potassium of 5.8. He attempted to call the patient without success. Review of med list shows patient is not on any medication that can raise the potassium level.   Please contact the patient and have her come in for stat repeat BMET today to make sure previous potassium level is real.

## 2022-08-03 NOTE — Telephone Encounter (Signed)
Attempted to contact again to notify of repeat blood work.   LVM to call back

## 2022-08-09 ENCOUNTER — Other Ambulatory Visit: Payer: Self-pay | Admitting: *Deleted

## 2022-08-09 DIAGNOSIS — E875 Hyperkalemia: Secondary | ICD-10-CM

## 2022-08-09 NOTE — Telephone Encounter (Signed)
Spoke with pt daughter, aware of lab results and need for repeat lab work. She will try to come Thursday or Friday this week. Order placed

## 2022-08-11 NOTE — Telephone Encounter (Signed)
Called and spoke with patients daughter- she will bring patient in for labs on Friday this week. Order is already in.

## 2022-08-12 DIAGNOSIS — E875 Hyperkalemia: Secondary | ICD-10-CM | POA: Diagnosis not present

## 2022-08-13 LAB — BASIC METABOLIC PANEL
BUN/Creatinine Ratio: 19 (ref 12–28)
BUN: 29 mg/dL (ref 10–36)
CO2: 22 mmol/L (ref 20–29)
Calcium: 10.5 mg/dL — ABNORMAL HIGH (ref 8.7–10.3)
Chloride: 103 mmol/L (ref 96–106)
Creatinine, Ser: 1.49 mg/dL — ABNORMAL HIGH (ref 0.57–1.00)
Glucose: 145 mg/dL — ABNORMAL HIGH (ref 70–99)
Potassium: 5.1 mmol/L (ref 3.5–5.2)
Sodium: 142 mmol/L (ref 134–144)
eGFR: 32 mL/min/{1.73_m2} — ABNORMAL LOW (ref >59)

## 2022-08-15 ENCOUNTER — Encounter: Payer: Self-pay | Admitting: *Deleted

## 2022-10-03 IMAGING — US US EXTREM LOW VENOUS*R*
1 series · 13 of 24 positions shown · non-contrast
Comparison: None.

CLINICAL DATA: Right ankle pain and edema.



[Series 1: us extrem low venous*right* · 13 of 49 slices shown]
[im 1/49]
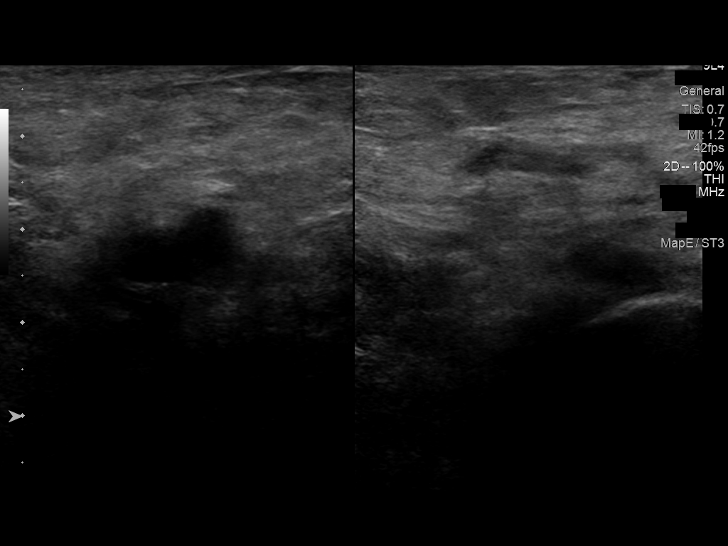
[im 5/49]
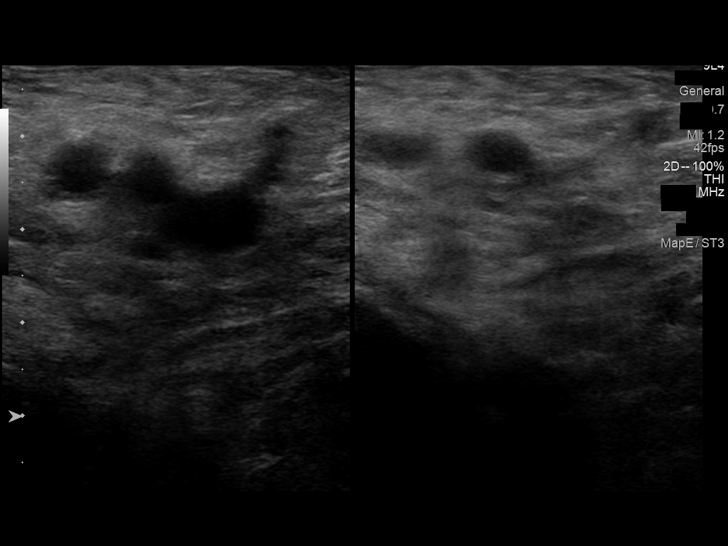
[im 9/49]
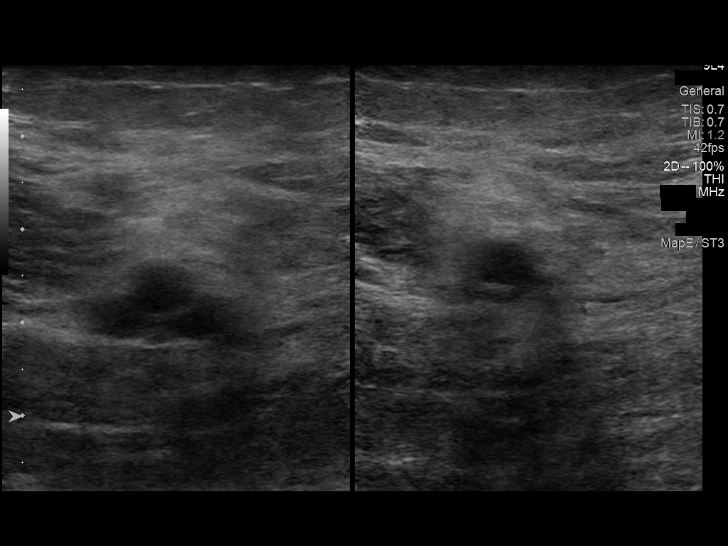
[im 13/49]
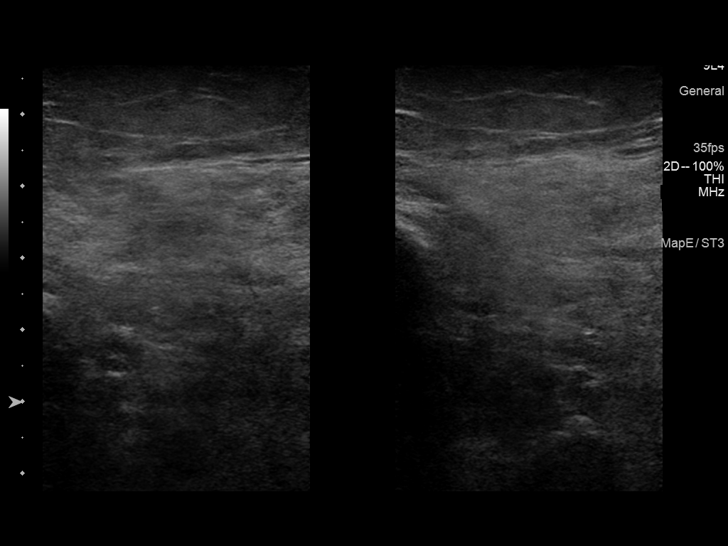
[im 17/49]
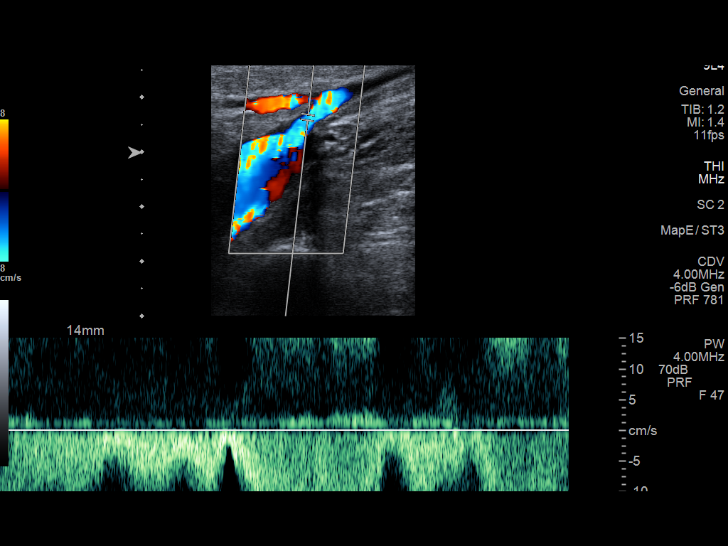
[im 21/49]
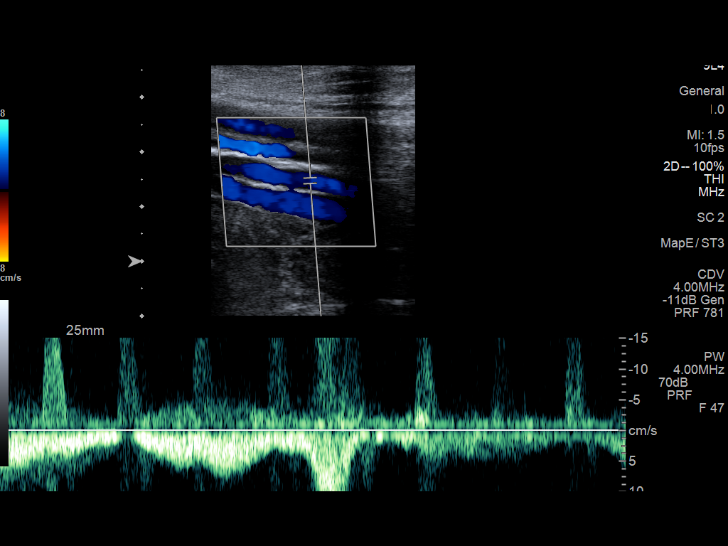
[im 26/49]
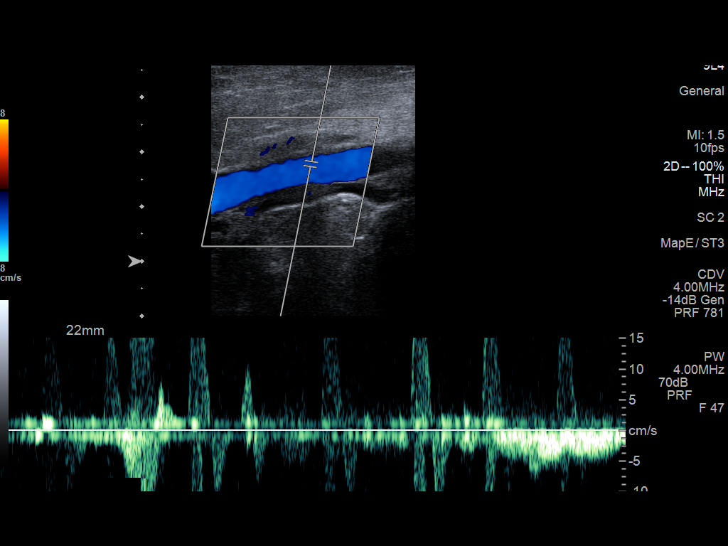
[im 28/49]
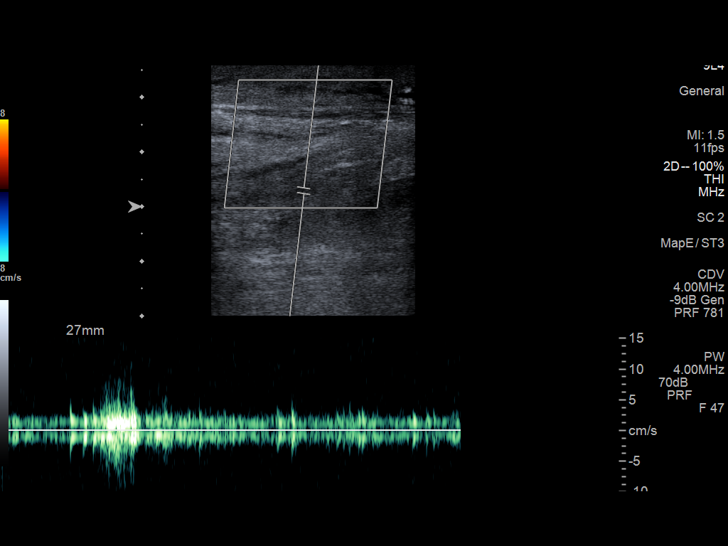
[im 32/49]
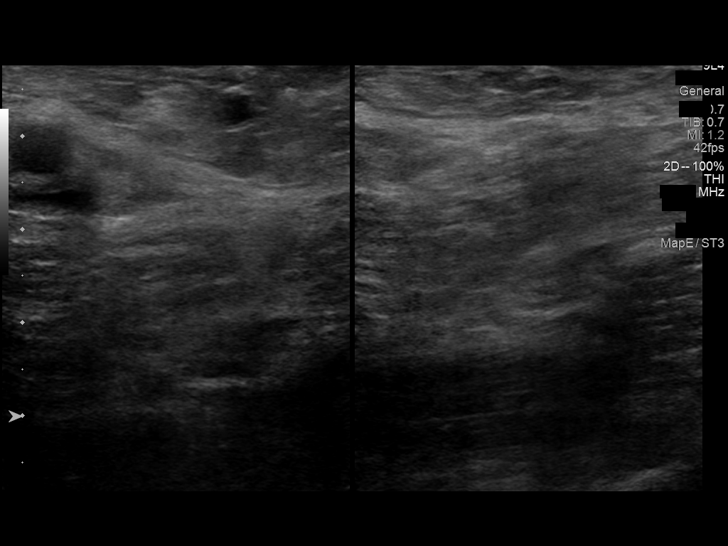
[im 36/49]
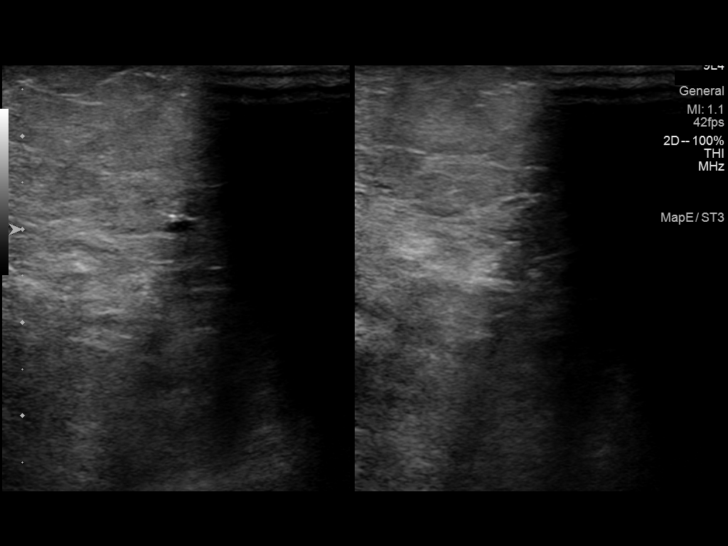
[im 40/49]
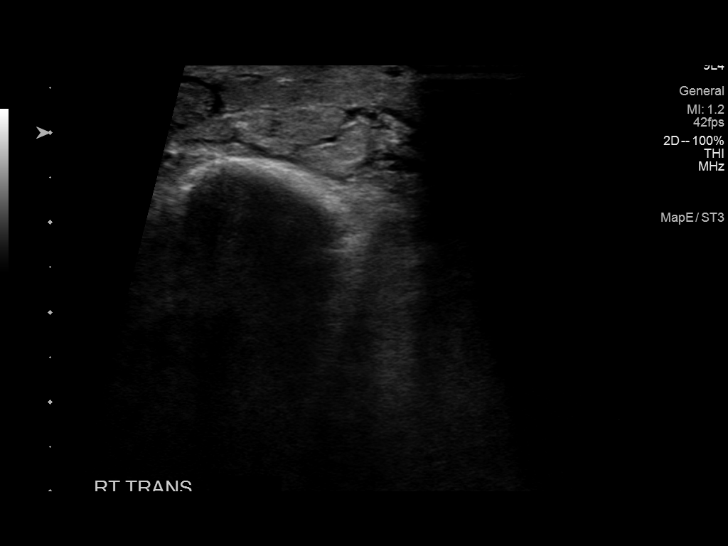
[im 44/49]
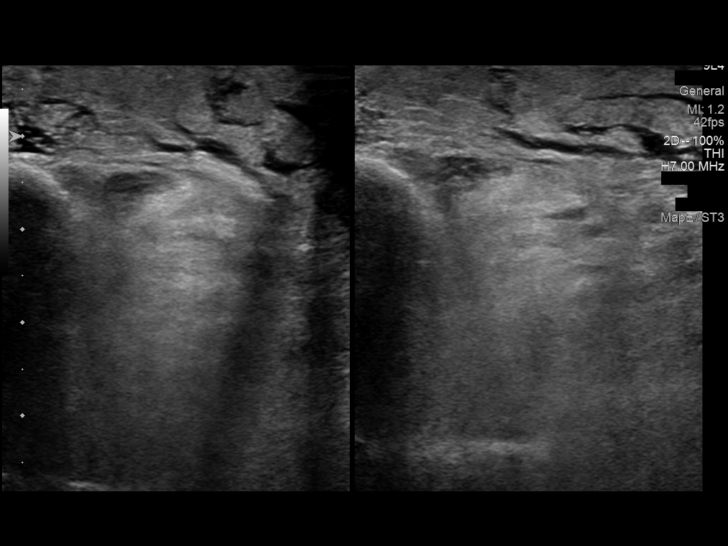
[im 49/49]
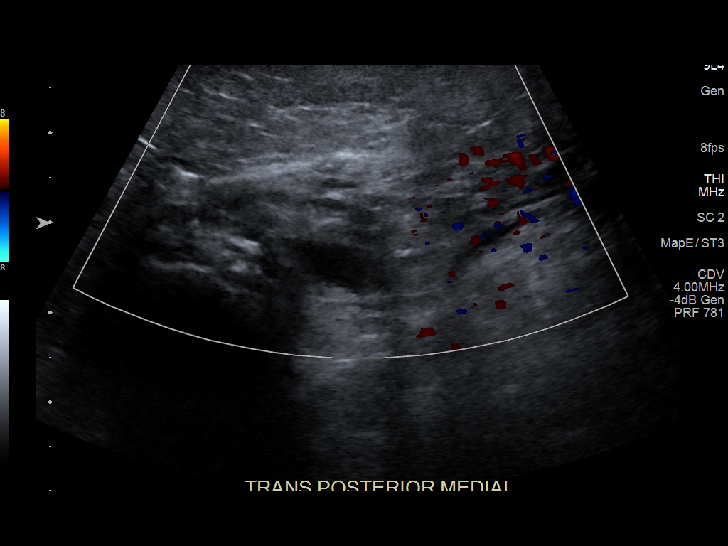

[13 of 24 positions shown; findings below may reference images not displayed]

FINDINGS: Contralateral Common Femoral Vein: Respiratory phasicity is normal
and symmetric with the symptomatic side. No evidence of thrombus.
Normal compressibility.

Common Femoral Vein: No evidence of thrombus. Normal
compressibility, respiratory phasicity and response to augmentation.

Saphenofemoral Junction: No evidence of thrombus. Normal
compressibility and flow on color Doppler imaging.

Profunda Femoral Vein: No evidence of thrombus. Normal
compressibility and flow on color Doppler imaging.

Femoral Vein: No evidence of thrombus. Normal compressibility,
respiratory phasicity and response to augmentation.

Popliteal Vein: No evidence of thrombus. Normal compressibility,
respiratory phasicity and response to augmentation.

Calf Veins: No evidence of thrombus. Normal compressibility and flow
on color Doppler imaging.

Superficial Great Saphenous Vein: No evidence of thrombus. Normal
compressibility.

Venous Reflux:  None.

Other Findings: No evidence of superficial thrombophlebitis. Tiny
fluid collection of the popliteal fossa measures 1.3 x 0.6 x 1.0 cm
and is consistent with a Baker's cyst.
IMPRESSION: 1. No evidence of right lower extremity deep vein thrombosis.
2. Tiny right popliteal fossa Baker's cyst.

## 2022-10-06 DIAGNOSIS — I509 Heart failure, unspecified: Secondary | ICD-10-CM | POA: Diagnosis not present

## 2022-10-06 DIAGNOSIS — E7800 Pure hypercholesterolemia, unspecified: Secondary | ICD-10-CM | POA: Diagnosis not present

## 2022-10-06 DIAGNOSIS — R8289 Other abnormal findings on cytological and histological examination of urine: Secondary | ICD-10-CM | POA: Diagnosis not present

## 2022-10-06 DIAGNOSIS — E1165 Type 2 diabetes mellitus with hyperglycemia: Secondary | ICD-10-CM | POA: Diagnosis not present

## 2022-10-06 DIAGNOSIS — I10 Essential (primary) hypertension: Secondary | ICD-10-CM | POA: Diagnosis not present

## 2022-10-06 DIAGNOSIS — R5383 Other fatigue: Secondary | ICD-10-CM | POA: Diagnosis not present

## 2022-10-06 DIAGNOSIS — F5101 Primary insomnia: Secondary | ICD-10-CM | POA: Diagnosis not present

## 2022-10-06 LAB — LAB REPORT - SCANNED
A1c: 5.9
EGFR: 35

## 2022-10-14 DIAGNOSIS — I10 Essential (primary) hypertension: Secondary | ICD-10-CM | POA: Diagnosis not present

## 2022-10-14 DIAGNOSIS — E7800 Pure hypercholesterolemia, unspecified: Secondary | ICD-10-CM | POA: Diagnosis not present

## 2022-10-14 DIAGNOSIS — I509 Heart failure, unspecified: Secondary | ICD-10-CM | POA: Diagnosis not present

## 2022-10-14 DIAGNOSIS — Z0000 Encounter for general adult medical examination without abnormal findings: Secondary | ICD-10-CM | POA: Diagnosis not present

## 2022-10-14 DIAGNOSIS — F418 Other specified anxiety disorders: Secondary | ICD-10-CM | POA: Diagnosis not present

## 2022-10-14 DIAGNOSIS — E1165 Type 2 diabetes mellitus with hyperglycemia: Secondary | ICD-10-CM | POA: Diagnosis not present

## 2022-10-14 DIAGNOSIS — F5101 Primary insomnia: Secondary | ICD-10-CM | POA: Diagnosis not present

## 2022-10-14 DIAGNOSIS — D509 Iron deficiency anemia, unspecified: Secondary | ICD-10-CM | POA: Diagnosis not present

## 2022-11-22 ENCOUNTER — Telehealth: Payer: Self-pay | Admitting: Cardiology

## 2022-11-22 NOTE — Telephone Encounter (Signed)
Daughter stated mom was not taking amlodipine. She wanted to make sure she was still supposed to be taking before she started her back on amlodipine. I did advise per her last visit with Dr. [place] in February it states to continue on amlodipine. She verbalized understanding.

## 2022-11-22 NOTE — Telephone Encounter (Signed)
Pt c/o medication issue:  1. Name of Medication:  amLODipine (NORVASC) 5 MG tablet  2. How are you currently taking this medication (dosage and times per day)?   3. Are you having a reaction (difficulty breathing--STAT)?   4. What is your medication issue?    Patient's daughter states the patient has not been taking Amlodipine and she would like to confirm whether or not she should be taking it. Please clarify instructions.

## 2023-02-16 NOTE — Progress Notes (Signed)
Cardiology Office Note:    Date:  02/20/2023   ID:  Brandi Mccoy, DOB July 18, 1921, MRN [id number]  PCP:  Pearson Grippe, MD   [facility] Providers Cardiologist:  Evonna Stoltz [place], MD     Referring MD: Pearson Grippe, MD   Chief Complaint  Patient presents with   Congestive Heart Failure    History of Present Illness:    Brandi Mccoy is a 89 y.o. female seen for follow up diastolic CHF. She has a history of DM, HLD, and HTN. Has a chronic LBBB. Last seen by me in 2015. Was seen for HTN management in 2020. Was seen in 2015 for dyspnea and palpitation.  Holter monitor at the time showed sinus bradycardia and sinus tachycardia, first-degree AV block and PACs, no atrial fibrillation.  Echocardiogram showed asymmetric septal hypertrophy and a normal LV function, mild MR.   Was seen on 05/19/2015.  Her blood pressure was over 200 at the time.  Low-dose carvedilol was added for her palpitation, she was quite stressed out and has chronic anxiety issues.  Repeat echocardiogram at the time continue to show mild aortic stenosis, no other significant finding.  Myoview obtained on 05/27/2015 showed EF 64%, small sized mild intensity fixed apical and distal anterior apical defect likely related to left bundle branch block, no significant reversible ischemia.  Overall considered a low risk study. Repeat echocardiogram obtained on 02/25/2019 showed EF 60 to 65%, mild to moderate LAE, moderate calcification of the mitral valve, mild aortic stenosis.  In September 2023 she was seen for evaluation of increased edema. She had her lasix doubled for one week. Actos was discontinued. Started on Farxiga. BNP was elevated to 3660.on subsequent follow up she noted improvement.    Edema is better. Denies PND, orthopnea, chest pain, palpitations, dizziness. No SOB. Weight is down 10 lbs since last November.   Past Medical History:  Diagnosis Date   Arthritis    Bilateral inguinal hernia    Colonic polyp     Adenomatous   Depression    Diabetes mellitus type 2 in nonobese [facility])    Diverticulosis    Colon   GERD (gastroesophageal reflux disease)    H/O: hysterectomy 2003   Hyperlipidemia    Hypertension    Osteoporosis    Tubular adenoma    Shown on colonoscopy November 2008 and prior colonoscopy in 1998    Past Surgical History:  Procedure Laterality Date   APPENDECTOMY     LAPAROTOMY      Current Medications: Current Meds  Medication Sig   amLODipine (NORVASC) 5 MG tablet Take 5 mg by mouth daily.   Cyanocobalamin (VITAMIN B-12 PO) Take 1 tablet by mouth daily.   dexamethasone (DECADRON) 0.5 MG/5ML solution Take 1.5 mg by mouth 4 (four) times daily.   eszopiclone (LUNESTA) 2 MG TABS tablet Take 2 mg by mouth at bedtime.   FARXIGA 5 MG TABS tablet Take 5 mg by mouth daily.   ferrous sulfate 325 (65 FE) MG tablet Take 325 mg by mouth daily.   furosemide (LASIX) 20 MG tablet TAKE 1 TABLET BY MOUTH ONCE A DAY AS NEEDED FOR EDEMA   hydrALAZINE (APRESOLINE) 50 MG tablet Take 1 tablet (50 mg total) by mouth 3 (three) times daily.   Multiple Vitamin (MULTIVITAMIN WITH MINERALS) TABS tablet Take 1 tablet by mouth daily.   pravastatin (PRAVACHOL) 10 MG tablet TAKE 1 TABLET ONCE A DAY FOR CHOLESTEROL (START 1X PER WEEK)     Allergies:  Patient has no known allergies.   Social History   Socioeconomic History   Marital status: Married    Spouse name: Not on file   Number of children: 2   Years of education: Not on file   Highest education level: Not on file  Occupational History   Not on file  Tobacco Use   Smoking status: Never   Smokeless tobacco: Never  Substance and Sexual Activity   Alcohol use: Yes   Drug use: No   Sexual activity: Not on file  Other Topics Concern   Not on file  Social History Narrative   Not on file   Social Determinants of Health   Financial Resource Strain: Not on file  Food Insecurity: Not on file  Transportation Needs: Not on file   Physical Activity: Not on file  Stress: Not on file  Social Connections: Not on file     Family History: The patient's family history includes Heart disease in her mother.  ROS:   Please see the history of present illness.     All other systems reviewed and are negative.  EKGs/Labs/Other Studies Reviewed:    The following studies were reviewed today: Myoview 05/27/2015 Study Highlights   The left ventricular ejection fraction is normal (55-65%). Nuclear stress EF: 64%. No T wave inversion was noted during stress. There was no ST segment deviation noted during stress. Defect 1: There is a small defect of mild severity.   Small-size, mild intensity mostly fixed apical, distal anteroapical/anteroseptal artifact - likely related to LBBB. No significant reversible ischemia. LVEF 64% - normal wall motion. This is a low risk study.      Echo 02/25/2019 IMPRESSIONS      1. The left ventricle has normal systolic function with an ejection fraction of 60-65%. The cavity size was normal. There is mildly increased left ventricular wall thickness. Left ventricular diastolic Doppler parameters are indeterminate.  2. The right ventricle has normal systolic function. The cavity was normal. There is no increase in right ventricular wall thickness.  3. Left atrial size was mild-moderately dilated.  4. The mitral valve is degenerative. Moderate thickening of the mitral valve leaflet. Moderate calcification of the mitral valve leaflet. There is severe mitral annular calcification present.  5. Severe posterior annular calcification extending sub chordally.  6. The aortic valve was not well visualized. Moderate thickening of the aortic valve. Moderate calcification of the aortic valve. Mild stenosis of the aortic valve.  7. The aorta is normal unless otherwise noted.     EKG:  EKG is not ordered today.    Recent Labs: 08/01/2022: BNP 224.0 08/12/2022: BUN 29; Creatinine, Ser 1.49; Potassium 5.1;  Sodium 142  Recent Lipid Panel No results found for: "CHOL", "TRIG", "HDL", "CHOLHDL", "VLDL", "LDLCALC", "LDLDIRECT"  Dated 01/25/21: cholesterol 210, triglycerides 154, HDL 62. nonHDL 148.  Dated 07/28/21: A1c 6.3%. GFR 35. TSH normal.  Labs done in April reviewed in record.    Risk Assessment/Calculations:          Physical Exam:    VS:  BP (!) 164/66 (BP Location: Left Arm, Patient Position: Sitting, Cuff Size: Small)   Pulse 67   Ht 5\' 2"  (1.575 m)   Wt 98 lb (44.5 kg)   SpO2 98%   BMI 17.92 kg/m     Wt Readings from Last 3 Encounters:  02/20/23 98 lb (44.5 kg)  08/01/22 101 lb 6.4 oz (46 kg)  05/04/22 108 lb (49 kg)     GEN:  elderly WF in NAD Well nourished HEENT: Normal NECK: No JVD; No carotid bruits LYMPHATICS: No lymphadenopathy CARDIAC: RRR, soft 1/6 systolic murmur, no rubs, gallops RESPIRATORY:  Clear to auscultation without rales, wheezing or rhonchi  ABDOMEN: Soft, non-tender, non-distended MUSCULOSKELETAL:  trace right ankle edema; No deformity  SKIN: Warm and dry NEUROLOGIC:  Alert and oriented x 3 PSYCHIATRIC:  Normal affect   ASSESSMENT:    1. Chronic diastolic CHF (congestive heart failure) (HCC)   2. Resistant hypertension   3. Stage 3b chronic kidney disease (HCC)      PLAN:    In order of problems listed above:  Chronic diastolic CHF. Patient has improved with change in medication from Actos to [place]. Appears euvolemic on current lasix dose 20 mg daily. CHF exacerbated by CKD. Stressed importance of sodium restriction. Continue amlodipine, hydralazine, and lasix.  Resistant HTN. Avoid ARB,ACEi, Aldactone due to CKD. Continue current therapy CKD stage 3b. Close to stage IV. Avoid NSAIDs. Last GFR 35 in April. BUN 29, creatinine 1.28. Pro BNP still high 4100.  HLD on statin. Last LDL 72.         Signed, Nikkia Devoss [place], MD  02/20/2023 3:38 PM    [facility]

## 2023-02-20 ENCOUNTER — Ambulatory Visit: Payer: Medicare Other | Attending: Cardiology | Admitting: Cardiology

## 2023-02-20 ENCOUNTER — Encounter: Payer: Self-pay | Admitting: Cardiology

## 2023-02-20 VITALS — BP 164/66 | HR 67 | Ht 62.0 in | Wt 98.0 lb

## 2023-02-20 DIAGNOSIS — I1A0 Resistant hypertension: Secondary | ICD-10-CM | POA: Diagnosis not present

## 2023-02-20 DIAGNOSIS — N1832 Chronic kidney disease, stage 3b: Secondary | ICD-10-CM | POA: Insufficient documentation

## 2023-02-20 DIAGNOSIS — I5032 Chronic diastolic (congestive) heart failure: Secondary | ICD-10-CM | POA: Insufficient documentation

## 2023-02-20 NOTE — Patient Instructions (Signed)
Medication Instructions:  Your physician recommends that you continue on your current medications as directed. Please refer to the Current Medication list given to you today.  *If you need a refill on your cardiac medications before your next appointment, please call your pharmacy*   Follow-Up: At [facility], you and your health needs are our priority.  As part of our continuing mission to provide you with exceptional heart care, we have created designated Provider Care Teams.  These Care Teams include your primary Cardiologist (physician) and Advanced Practice Providers (APPs -  Physician Assistants and Nurse Practitioners) who all work together to provide you with the care you need, when you need it.  We recommend signing up for the patient portal called "MyChart".  Sign up information is provided on this After Visit Summary.  MyChart is used to connect with patients for Virtual Visits (Telemedicine).  Patients are able to view lab/test results, encounter notes, upcoming appointments, etc.  Non-urgent messages can be sent to your provider as well.   To learn more about what you can do with MyChart, go to [email or web address].    Your next appointment:   6 month(s)  Provider:   Peter Martinique, MD

## 2023-03-03 DIAGNOSIS — Z23 Encounter for immunization: Secondary | ICD-10-CM | POA: Diagnosis not present

## 2023-04-10 DIAGNOSIS — I10 Essential (primary) hypertension: Secondary | ICD-10-CM | POA: Diagnosis not present

## 2023-04-10 DIAGNOSIS — E7800 Pure hypercholesterolemia, unspecified: Secondary | ICD-10-CM | POA: Diagnosis not present

## 2023-04-10 DIAGNOSIS — E1165 Type 2 diabetes mellitus with hyperglycemia: Secondary | ICD-10-CM | POA: Diagnosis not present

## 2023-04-17 DIAGNOSIS — E7800 Pure hypercholesterolemia, unspecified: Secondary | ICD-10-CM | POA: Diagnosis not present

## 2023-04-17 DIAGNOSIS — I10 Essential (primary) hypertension: Secondary | ICD-10-CM | POA: Diagnosis not present

## 2023-04-17 DIAGNOSIS — E1165 Type 2 diabetes mellitus with hyperglycemia: Secondary | ICD-10-CM | POA: Diagnosis not present

## 2023-04-17 DIAGNOSIS — F5101 Primary insomnia: Secondary | ICD-10-CM | POA: Diagnosis not present

## 2023-04-17 DIAGNOSIS — F418 Other specified anxiety disorders: Secondary | ICD-10-CM | POA: Diagnosis not present

## 2023-04-17 DIAGNOSIS — D509 Iron deficiency anemia, unspecified: Secondary | ICD-10-CM | POA: Diagnosis not present

## 2023-04-17 DIAGNOSIS — R634 Abnormal weight loss: Secondary | ICD-10-CM | POA: Diagnosis not present

## 2023-04-17 DIAGNOSIS — I5032 Chronic diastolic (congestive) heart failure: Secondary | ICD-10-CM | POA: Diagnosis not present

## 2023-04-19 DIAGNOSIS — H43393 Other vitreous opacities, bilateral: Secondary | ICD-10-CM | POA: Diagnosis not present

## 2023-04-19 DIAGNOSIS — H3562 Retinal hemorrhage, left eye: Secondary | ICD-10-CM | POA: Diagnosis not present

## 2023-04-19 DIAGNOSIS — H3530 Unspecified macular degeneration: Secondary | ICD-10-CM | POA: Diagnosis not present

## 2023-06-24 ENCOUNTER — Inpatient Hospital Stay (HOSPITAL_COMMUNITY)
Admission: EM | Admit: 2023-06-24 | Discharge: 2023-06-29 | DRG: 521 | Disposition: A | Discharge status: Skilled Nursing Facility | Payer: Medicare Other | Source: Ambulatory Visit | Attending: Student | Admitting: Student

## 2023-06-24 ENCOUNTER — Other Ambulatory Visit: Payer: Self-pay

## 2023-06-24 ENCOUNTER — Emergency Department (HOSPITAL_COMMUNITY): Payer: Medicare Other

## 2023-06-24 ENCOUNTER — Encounter (HOSPITAL_COMMUNITY): Payer: Self-pay

## 2023-06-24 DIAGNOSIS — T8484XD Pain due to internal orthopedic prosthetic devices, implants and grafts, subsequent encounter: Secondary | ICD-10-CM | POA: Diagnosis not present

## 2023-06-24 DIAGNOSIS — Z7401 Bed confinement status: Secondary | ICD-10-CM | POA: Diagnosis not present

## 2023-06-24 DIAGNOSIS — Z7189 Other specified counseling: Secondary | ICD-10-CM | POA: Diagnosis not present

## 2023-06-24 DIAGNOSIS — I447 Left bundle-branch block, unspecified: Secondary | ICD-10-CM | POA: Diagnosis present

## 2023-06-24 DIAGNOSIS — K5730 Diverticulosis of large intestine without perforation or abscess without bleeding: Secondary | ICD-10-CM | POA: Diagnosis not present

## 2023-06-24 DIAGNOSIS — Z66 Do not resuscitate: Secondary | ICD-10-CM | POA: Diagnosis present

## 2023-06-24 DIAGNOSIS — K4020 Bilateral inguinal hernia, without obstruction or gangrene, not specified as recurrent: Secondary | ICD-10-CM | POA: Diagnosis present

## 2023-06-24 DIAGNOSIS — K219 Gastro-esophageal reflux disease without esophagitis: Secondary | ICD-10-CM | POA: Diagnosis present

## 2023-06-24 DIAGNOSIS — W19XXXA Unspecified fall, initial encounter: Secondary | ICD-10-CM | POA: Diagnosis not present

## 2023-06-24 DIAGNOSIS — M1712 Unilateral primary osteoarthritis, left knee: Secondary | ICD-10-CM | POA: Diagnosis not present

## 2023-06-24 DIAGNOSIS — J952 Acute pulmonary insufficiency following nonthoracic surgery: Secondary | ICD-10-CM | POA: Diagnosis not present

## 2023-06-24 DIAGNOSIS — Z96642 Presence of left artificial hip joint: Secondary | ICD-10-CM | POA: Diagnosis not present

## 2023-06-24 DIAGNOSIS — Z471 Aftercare following joint replacement surgery: Secondary | ICD-10-CM | POA: Diagnosis not present

## 2023-06-24 DIAGNOSIS — S72032A Displaced midcervical fracture of left femur, initial encounter for closed fracture: Secondary | ICD-10-CM | POA: Diagnosis not present

## 2023-06-24 DIAGNOSIS — E1122 Type 2 diabetes mellitus with diabetic chronic kidney disease: Secondary | ICD-10-CM | POA: Diagnosis present

## 2023-06-24 DIAGNOSIS — R54 Age-related physical debility: Secondary | ICD-10-CM | POA: Diagnosis present

## 2023-06-24 DIAGNOSIS — W1830XA Fall on same level, unspecified, initial encounter: Secondary | ICD-10-CM | POA: Diagnosis present

## 2023-06-24 DIAGNOSIS — R531 Weakness: Secondary | ICD-10-CM | POA: Diagnosis not present

## 2023-06-24 DIAGNOSIS — R262 Difficulty in walking, not elsewhere classified: Secondary | ICD-10-CM | POA: Diagnosis not present

## 2023-06-24 DIAGNOSIS — I130 Hypertensive heart and chronic kidney disease with heart failure and stage 1 through stage 4 chronic kidney disease, or unspecified chronic kidney disease: Secondary | ICD-10-CM | POA: Diagnosis not present

## 2023-06-24 DIAGNOSIS — I3481 Nonrheumatic mitral (valve) annulus calcification: Secondary | ICD-10-CM | POA: Diagnosis present

## 2023-06-24 DIAGNOSIS — R609 Edema, unspecified: Secondary | ICD-10-CM | POA: Diagnosis not present

## 2023-06-24 DIAGNOSIS — S72002A Fracture of unspecified part of neck of left femur, initial encounter for closed fracture: Secondary | ICD-10-CM | POA: Diagnosis not present

## 2023-06-24 DIAGNOSIS — M810 Age-related osteoporosis without current pathological fracture: Secondary | ICD-10-CM | POA: Diagnosis present

## 2023-06-24 DIAGNOSIS — K5900 Constipation, unspecified: Secondary | ICD-10-CM | POA: Diagnosis present

## 2023-06-24 DIAGNOSIS — D509 Iron deficiency anemia, unspecified: Secondary | ICD-10-CM | POA: Diagnosis present

## 2023-06-24 DIAGNOSIS — Y92009 Unspecified place in unspecified non-institutional (private) residence as the place of occurrence of the external cause: Secondary | ICD-10-CM

## 2023-06-24 DIAGNOSIS — S72002D Fracture of unspecified part of neck of left femur, subsequent encounter for closed fracture with routine healing: Secondary | ICD-10-CM | POA: Diagnosis not present

## 2023-06-24 DIAGNOSIS — M160 Bilateral primary osteoarthritis of hip: Secondary | ICD-10-CM | POA: Diagnosis not present

## 2023-06-24 DIAGNOSIS — Z79899 Other long term (current) drug therapy: Secondary | ICD-10-CM

## 2023-06-24 DIAGNOSIS — R636 Underweight: Secondary | ICD-10-CM | POA: Diagnosis not present

## 2023-06-24 DIAGNOSIS — E785 Hyperlipidemia, unspecified: Secondary | ICD-10-CM | POA: Diagnosis not present

## 2023-06-24 DIAGNOSIS — M25562 Pain in left knee: Secondary | ICD-10-CM | POA: Diagnosis not present

## 2023-06-24 DIAGNOSIS — I509 Heart failure, unspecified: Secondary | ICD-10-CM | POA: Diagnosis present

## 2023-06-24 DIAGNOSIS — D631 Anemia in chronic kidney disease: Secondary | ICD-10-CM | POA: Diagnosis not present

## 2023-06-24 DIAGNOSIS — R1311 Dysphagia, oral phase: Secondary | ICD-10-CM | POA: Diagnosis not present

## 2023-06-24 DIAGNOSIS — E43 Unspecified severe protein-calorie malnutrition: Secondary | ICD-10-CM | POA: Diagnosis not present

## 2023-06-24 DIAGNOSIS — Z7982 Long term (current) use of aspirin: Secondary | ICD-10-CM

## 2023-06-24 DIAGNOSIS — Z7984 Long term (current) use of oral hypoglycemic drugs: Secondary | ICD-10-CM

## 2023-06-24 DIAGNOSIS — E119 Type 2 diabetes mellitus without complications: Secondary | ICD-10-CM | POA: Diagnosis not present

## 2023-06-24 DIAGNOSIS — Z8249 Family history of ischemic heart disease and other diseases of the circulatory system: Secondary | ICD-10-CM

## 2023-06-24 DIAGNOSIS — Z01810 Encounter for preprocedural cardiovascular examination: Secondary | ICD-10-CM | POA: Diagnosis not present

## 2023-06-24 DIAGNOSIS — Z681 Body mass index (BMI) 19 or less, adult: Secondary | ICD-10-CM

## 2023-06-24 DIAGNOSIS — S72042A Displaced fracture of base of neck of left femur, initial encounter for closed fracture: Secondary | ICD-10-CM | POA: Diagnosis not present

## 2023-06-24 DIAGNOSIS — R41841 Cognitive communication deficit: Secondary | ICD-10-CM | POA: Diagnosis not present

## 2023-06-24 DIAGNOSIS — I129 Hypertensive chronic kidney disease with stage 1 through stage 4 chronic kidney disease, or unspecified chronic kidney disease: Secondary | ICD-10-CM | POA: Diagnosis not present

## 2023-06-24 DIAGNOSIS — M6281 Muscle weakness (generalized): Secondary | ICD-10-CM | POA: Diagnosis not present

## 2023-06-24 DIAGNOSIS — N1832 Chronic kidney disease, stage 3b: Secondary | ICD-10-CM | POA: Insufficient documentation

## 2023-06-24 DIAGNOSIS — S72009A Fracture of unspecified part of neck of unspecified femur, initial encounter for closed fracture: Secondary | ICD-10-CM | POA: Diagnosis present

## 2023-06-24 DIAGNOSIS — Z01818 Encounter for other preprocedural examination: Secondary | ICD-10-CM | POA: Diagnosis not present

## 2023-06-24 DIAGNOSIS — M25552 Pain in left hip: Secondary | ICD-10-CM | POA: Diagnosis not present

## 2023-06-24 DIAGNOSIS — M4692 Unspecified inflammatory spondylopathy, cervical region: Secondary | ICD-10-CM | POA: Diagnosis not present

## 2023-06-24 DIAGNOSIS — Z9181 History of falling: Secondary | ICD-10-CM | POA: Diagnosis not present

## 2023-06-24 LAB — CBC WITH DIFFERENTIAL/PLATELET
Abs Immature Granulocytes: 0.02 10*3/uL (ref 0.00–0.07)
Basophils Absolute: 0.1 10*3/uL (ref 0.0–0.1)
Basophils Relative: 1 %
Eosinophils Absolute: 0.1 10*3/uL (ref 0.0–0.5)
Eosinophils Relative: 1 %
HCT: 34.6 % — ABNORMAL LOW (ref 36.0–46.0)
Hemoglobin: 10.7 g/dL — ABNORMAL LOW (ref 12.0–15.0)
Immature Granulocytes: 0 %
Lymphocytes Relative: 14 %
Lymphs Abs: 1.1 10*3/uL (ref 0.7–4.0)
MCH: 28.8 pg (ref 26.0–34.0)
MCHC: 30.9 g/dL (ref 30.0–36.0)
MCV: 93.3 fL (ref 80.0–100.0)
Monocytes Absolute: 0.6 10*3/uL (ref 0.1–1.0)
Monocytes Relative: 7 %
Neutro Abs: 6 10*3/uL (ref 1.7–7.7)
Neutrophils Relative %: 77 %
Platelets: 228 10*3/uL (ref 150–400)
RBC: 3.71 MIL/uL — ABNORMAL LOW (ref 3.87–5.11)
RDW: 13.8 % (ref 11.5–15.5)
WBC: 7.9 10*3/uL (ref 4.0–10.5)
nRBC: 0 % (ref 0.0–0.2)

## 2023-06-24 LAB — BASIC METABOLIC PANEL
Anion gap: 11 (ref 5–15)
BUN: 25 mg/dL — ABNORMAL HIGH (ref 8–23)
CO2: 21 mmol/L — ABNORMAL LOW (ref 22–32)
Calcium: 9.3 mg/dL (ref 8.9–10.3)
Chloride: 107 mmol/L (ref 98–111)
Creatinine, Ser: 1.28 mg/dL — ABNORMAL HIGH (ref 0.44–1.00)
GFR, Estimated: 38 mL/min — ABNORMAL LOW (ref >60)
Glucose, Bld: 120 mg/dL — ABNORMAL HIGH (ref 70–99)
Potassium: 4 mmol/L (ref 3.5–5.1)
Sodium: 139 mmol/L (ref 135–145)

## 2023-06-24 MED ORDER — POLYETHYLENE GLYCOL 3350 17 G PO PACK
17.0000 g | PACK | Freq: Every day | ORAL | Status: DC | PRN
  Administered 2023-06-28: 17 g via ORAL
  Filled 2023-06-24: qty 1

## 2023-06-24 MED ORDER — AMLODIPINE BESYLATE 5 MG PO TABS
5.0000 mg | ORAL_TABLET | Freq: Every day | ORAL | Status: DC
  Administered 2023-06-25 – 2023-06-29 (×5): 5 mg via ORAL
  Filled 2023-06-24 (×5): qty 1

## 2023-06-24 MED ORDER — MELATONIN 5 MG PO TABS
5.0000 mg | ORAL_TABLET | Freq: Every evening | ORAL | Status: DC | PRN
  Administered 2023-06-24 – 2023-06-25 (×2): 5 mg via ORAL
  Filled 2023-06-24 (×2): qty 1

## 2023-06-24 MED ORDER — ONDANSETRON HCL 4 MG/2ML IJ SOLN: 4.0000 mg | Freq: Four times a day (QID) | INTRAMUSCULAR | Status: DC | PRN

## 2023-06-24 MED ORDER — ACETAMINOPHEN 500 MG PO TABS
1000.0000 mg | ORAL_TABLET | Freq: Once | ORAL | Status: AC
  Administered 2023-06-24: 500 mg via ORAL
  Filled 2023-06-24: qty 2

## 2023-06-24 MED ORDER — ACETAMINOPHEN 650 MG RE SUPP: 650.0000 mg | Freq: Four times a day (QID) | RECTAL | Status: DC | PRN

## 2023-06-24 MED ORDER — ACETAMINOPHEN 325 MG PO TABS
650.0000 mg | ORAL_TABLET | Freq: Four times a day (QID) | ORAL | Status: DC | PRN
Start: 2023-06-24 — End: 2023-06-26
  Administered 2023-06-26: 650 mg via ORAL
  Filled 2023-06-24 (×2): qty 2

## 2023-06-24 MED ORDER — HYDROCODONE-ACETAMINOPHEN 5-325 MG PO TABS
1.0000 | ORAL_TABLET | Freq: Four times a day (QID) | ORAL | Status: DC | PRN
  Administered 2023-06-24 – 2023-06-28 (×5): 1 via ORAL
  Filled 2023-06-24 (×6): qty 1

## 2023-06-24 MED ORDER — HYDRALAZINE HCL 50 MG PO TABS
50.0000 mg | ORAL_TABLET | Freq: Three times a day (TID) | ORAL | Status: DC
  Administered 2023-06-25 – 2023-06-29 (×12): 50 mg via ORAL
  Filled 2023-06-24 (×10): qty 1
  Filled 2023-06-24: qty 2
  Filled 2023-06-24: qty 1

## 2023-06-24 MED ORDER — FENTANYL CITRATE PF 50 MCG/ML IJ SOSY: 12.5000 ug | PREFILLED_SYRINGE | INTRAMUSCULAR | Status: DC | PRN

## 2023-06-24 MED ORDER — PRAVASTATIN SODIUM 20 MG PO TABS
10.0000 mg | ORAL_TABLET | Freq: Every day | ORAL | Status: DC
  Administered 2023-06-25 – 2023-06-29 (×5): 10 mg via ORAL
  Filled 2023-06-24 (×5): qty 1

## 2023-06-24 MED ORDER — ONDANSETRON HCL 4 MG PO TABS: 4.0000 mg | ORAL_TABLET | Freq: Four times a day (QID) | ORAL | Status: DC | PRN

## 2023-06-24 NOTE — H&P (Signed)
History and Physical    Brandi Mccoy:[id number] DOB: 04-21-1922 DOA: 06/24/2023  PCP: Linus Galas, NP   Chief Complaint:  fall  HPI: Brandi Mccoy is a 89 y.o. female with medical history significant of depression, type 2 diabetes, GERD, hypertension, hyperlipidemia who presents emerged from after ground-level fall.  Patient typically walks with a walker.  She is walk around her house when her left leg lost control when she fell.  She was able to walk however due to persistent pain she presented to the ER where she was found be afebrile hemodynamically stable.  Labs were obtained arrival which demonstrated sodium 139, creatinine 1.28 at baseline, WBC 7.9, hemoglobin 10.7.  Patient had CT hip which is currently pending.  X-ray of knee showed no acute changes.  X-ray of hip showed impacted transcervical left femoral neck fracture.  Orthopedics was consulted and will see the patient and evaluate for possible surgical intervention in the morning.  Patient's family endorses a history of congestive heart failure.  Patient appears euvolemic.  Will order echocardiogram to further assess.  She denies any chest pain or shortness of breath with exertion.   Review of Systems: Review of Systems  Constitutional:  Negative for chills and fever.  HENT: Negative.    Eyes: Negative.   Respiratory: Negative.    Cardiovascular: Negative.   Gastrointestinal: Negative.   Genitourinary: Negative.   Musculoskeletal: Negative.   Skin: Negative.   Neurological: Negative.   Endo/Heme/Allergies: Negative.   Psychiatric/Behavioral: Negative.    All other systems reviewed and are negative.    As per HPI otherwise 10 point review of systems negative.   No Known Allergies  Past Medical History:  Diagnosis Date   Arthritis    Bilateral inguinal hernia    Colonic polyp    Adenomatous   Depression    Diabetes mellitus type 2 in nonobese [facility])    Diverticulosis    Colon   GERD (gastroesophageal  reflux disease)    H/O: hysterectomy 2003   Hyperlipidemia    Hypertension    Osteoporosis    Tubular adenoma    Shown on colonoscopy November 2008 and prior colonoscopy in 1998    Past Surgical History:  Procedure Laterality Date   APPENDECTOMY     LAPAROTOMY       reports that she has never smoked. She has never used smokeless tobacco. She reports current alcohol use. She reports that she does not use drugs.  Family History  Problem Relation Age of Onset   Heart disease Mother     Prior to Admission medications   Medication Sig Start Date End Date Taking? Authorizing Provider  amLODipine (NORVASC) 5 MG tablet Take 5 mg by mouth daily. 02/04/22   [provider]  Cyanocobalamin (VITAMIN B-12 PO) Take 1 tablet by mouth daily.    [provider]  dexamethasone (DECADRON) 0.5 MG/5ML solution Take 1.5 mg by mouth 4 (four) times daily. 07/25/22   [provider]  eszopiclone (LUNESTA) 2 MG TABS tablet Take 2 mg by mouth at bedtime.    [provider]  FARXIGA 5 MG TABS tablet Take 5 mg by mouth daily. 04/13/22   [provider]  ferrous sulfate 325 (65 FE) MG tablet Take 325 mg by mouth daily. 11/04/15   [provider]  furosemide (LASIX) 20 MG tablet TAKE 1 TABLET BY MOUTH ONCE A DAY AS NEEDED FOR EDEMA 01/17/18   [provider]  hydrALAZINE (APRESOLINE) 50 MG tablet  Take 1 tablet (50 mg total) by mouth 3 (three) times daily. 05/04/22   [place], Peter M, MD  Multiple Vitamin (MULTIVITAMIN WITH MINERALS) TABS tablet Take 1 tablet by mouth daily.    [provider]  pravastatin (PRAVACHOL) 10 MG tablet TAKE 1 TABLET ONCE A DAY FOR CHOLESTEROL (START 1X PER WEEK) 01/17/18   [provider]    Physical Exam: Vitals:   06/24/23 1309 06/24/23 1319 06/24/23 1600  BP: (!) 174/59  (!) 141/57  Pulse: 73  71  Resp: 16  17  Temp: 98.2 F (36.8 C)    TempSrc: Oral    SpO2: 97%  97%  Weight:  44.5 kg   Height:   5\' 2"  (1.575 m)    Physical Exam Constitutional:      Appearance: She is normal weight.  HENT:     Head: Normocephalic.     Nose: Nose normal.     Mouth/Throat:     Mouth: Mucous membranes are moist.     Pharynx: Oropharynx is clear.  Eyes:     Conjunctiva/sclera: Conjunctivae normal.     Pupils: Pupils are equal, round, and reactive to light.  Cardiovascular:     Rate and Rhythm: Normal rate and regular rhythm.     Pulses: Normal pulses.     Heart sounds: Normal heart sounds.  Pulmonary:     Effort: Pulmonary effort is normal.     Breath sounds: Normal breath sounds.  Abdominal:     General: Abdomen is flat. Bowel sounds are normal.  Musculoskeletal:        General: Normal range of motion.     Cervical back: Normal range of motion.  Skin:    General: Skin is warm.     Capillary Refill: Capillary refill takes less than 2 seconds.  Neurological:     General: No focal deficit present.     Mental Status: She is alert.  Psychiatric:        Mood and Affect: Mood normal.        Labs on Admission: I have personally reviewed the patients's labs and imaging studies.  Assessment/Plan Principal Problem:   Hip fracture (HCC)   # Left femoral neck fracture - Patient presented mechanical fall found to have fracture - Patient was able to ambulate with minimal pain - Orthopedics consulted will likely offer surgery  Plan: N.p.o. at midnight PT/OT/defer to surgery  #Preoperative evaluation-patient is relatively active and denies any chest pain or shortness of breath.  She has occasional swelling in her legs and her last echocardiogram was obtained in 2020.  Would recommend EKG and repeat echo to further assess cardiac function.  Patient is high risk for rehospitalization in the postoperative setting will likely require aggressive rehab which she is amenable to.  # Hypertension-continue amlodipine, hydralazine  # Hyperlipidemia-continue statin  # Chronic anemia-trend  hemoglobin   # CKD stage IIIb-trend creatinine    Admission status: Observation Med-Surg  Certification: The appropriate patient status for this patient is OBSERVATION. Observation status is judged to be reasonable and necessary in order to provide the required intensity of service to ensure the patient's safety. The patient's presenting symptoms, physical exam findings, and initial radiographic and laboratory data in the context of their medical condition is felt to place them at decreased risk for further clinical deterioration. Furthermore, it is anticipated that the patient will be medically stable for discharge from the hospital within 2 midnights of admission.     Alan Mulder  MD [facility] If 7PM-7AM, please contact night-coverage [email or web address]  06/24/2023, 5:46 PM

## 2023-06-24 NOTE — ED Provider Notes (Signed)
[place] EMERGENCY DEPARTMENT AT [facility] Provider Note   CSN: [id number] Arrival date & time: 06/24/23  1303     History Chief Complaint  Patient presents with   Fall   Hip Pain    HPI Brandi Mccoy is a 89 y.o. female presenting for ground-level fall.  States that she was using her walker going from the bathroom back to her couch when her left leg gave out and she fell over the left side.  Did not hit her head.  Only has pain in her left mid leg.  Has been able to ambulate since the fall.  Denies fevers chills nausea vomiting syncope shortness of breath..   Patient's recorded medical, surgical, social, medication list and allergies were reviewed in the Snapshot window as part of the initial history.   Review of Systems   Review of Systems  Constitutional:  Negative for chills and fever.  HENT:  Negative for ear pain and sore throat.   Eyes:  Negative for pain and visual disturbance.  Respiratory:  Negative for cough and shortness of breath.   Cardiovascular:  Negative for chest pain and palpitations.  Gastrointestinal:  Negative for abdominal pain and vomiting.  Genitourinary:  Negative for dysuria and hematuria.  Musculoskeletal:  Negative for arthralgias and back pain.  Skin:  Negative for color change and rash.  Neurological:  Negative for seizures and syncope.  All other systems reviewed and are negative.   Physical Exam Updated Vital Signs BP (!) 141/57   Pulse 71   Temp 98.2 F (36.8 C) (Oral)   Resp 17   Ht 5\' 2"  (1.575 m)   Wt 44.5 kg   SpO2 97%   BMI 17.92 kg/m  Physical Exam Vitals and nursing note reviewed.  Constitutional:      General: She is not in acute distress.    Appearance: She is well-developed.  HENT:     Head: Normocephalic and atraumatic.  Eyes:     Conjunctiva/sclera: Conjunctivae normal.  Cardiovascular:     Rate and Rhythm: Normal rate and regular rhythm.     Heart sounds: No murmur heard. Pulmonary:      Effort: Pulmonary effort is normal. No respiratory distress.     Breath sounds: Normal breath sounds.  Abdominal:     General: There is no distension.     Palpations: Abdomen is soft.     Tenderness: There is no abdominal tenderness. There is no right CVA tenderness or left CVA tenderness.  Musculoskeletal:        General: Tenderness present. No swelling. Normal range of motion.     Cervical back: Neck supple.  Skin:    General: Skin is warm and dry.  Neurological:     General: No focal deficit present.     Mental Status: She is alert and oriented to person, place, and time. Mental status is at baseline.     Cranial Nerves: No cranial nerve deficit.      ED Course/ Medical Decision Making/ A&P    Procedures Procedures   Medications Ordered in ED Medications  acetaminophen (TYLENOL) tablet 1,000 mg (500 mg Oral Given 06/24/23 1419)   Medical Decision Making:   Brandi Mccoy is a 89 y.o. female who presented to the ED today with a fall at their home detailed above. They are not on a blood thinner. Patient placed on continuous vitals and telemetry monitoring while in ED which was reviewed periodically.   Complete initial physical  exam performed, notably the patient  was hemodynamically stable  in no acute distress. No obvious deformities or injuries appreciated on extensive physical exam including active range of motion of all joints.     Reviewed and confirmed nursing documentation for past medical history, family history, social history.    Initial Assessment/Plan:   This is a patient presenting with a moderate blunt mechanism trauma.  As such, I have considered intracranial injuries including intracranial hemorrhage, intrathoracic injuries including blunt myocardial or blunt lung injury, blunt abdominal injuries including aortic dissection, bladder injury, spleen injury, liver injury and I have considered orthopedic injuries including extremity or spinal injury. This is most  consistent with an acute life/limb threatening illness complicated by underlying chronic conditions.  With the patient's presentation of moderate mechanism trauma but an otherwise reassuring exam, patient warrants targeted evaluation for potential traumatic injuries. Will proceed with targeted evaluation for potential injuries. Will proceed with targeted left knee and left hip x-rays  Images reviewed and agree with radiology interpretation.  DG Knee 2 Views Left Result Date: 06/24/2023 CLINICAL DATA:  Fall.  Left knee pain. EXAM: LEFT KNEE - 1-2 VIEW COMPARISON:  04/04/2022. FINDINGS: No acute fracture or dislocation. No aggressive osseous lesion. There are degenerative changes of the knee joint in the form of mildly reduced tibio-femoral compartment joint space, tibial spiking and osteophytosis. Note is made of meniscal chondrocalcinosis. No knee effusion or focal soft tissue swelling. No radiopaque foreign bodies. IMPRESSION: *No acute osseous abnormality of the left knee joint. Mild-to-moderate degenerative changes. Electronically Signed   By: Jules Schick M.D.   On: 06/24/2023 15:25   DG HIP UNILAT W OR W/O PELVIS 2-3 VIEWS LEFT Result Date: 06/24/2023 CLINICAL DATA:  Fall. EXAM: DG HIP (WITH OR WITHOUT PELVIS) 2-3V LEFT COMPARISON:  None Available. FINDINGS: There is impacted transcervical fracture of the left femoral neck. No other acute fracture or dislocation. No aggressive osseous lesion. Visualized sacral arcuate lines are unremarkable. Unremarkable symphysis pubis. There are mild degenerative changes of bilateral hip joints without significant joint space narrowing. Osteophytosis of the superior acetabulum. No radiopaque foreign bodies. IMPRESSION: *Impacted transcervical left femoral neck fracture. Electronically Signed   By: Jules Schick M.D.   On: 06/24/2023 15:24    Final Reassessment and Plan:   Consulted orthopedics who recommended a CT scan for surgical planning and recommended  patient be admitted for further care and management. Disposition:   Based on the above findings, I believe this patient is stable for admission.    Patient/family educated about specific findings on our evaluation and explained exact reasons for admission.  Patient/family educated about clinical situation and time was allowed to answer questions.   Admission team communicated with and agreed with need for admission. Patient admitted. Patient  ready to move at this time.     Emergency Department Medication Summary:   Medications  acetaminophen (TYLENOL) tablet 1,000 mg (500 mg Oral Given 06/24/23 1419)          Clinical Impression:  1. Fall, initial encounter      Admit   Final Clinical Impression(s) / ED Diagnoses Final diagnoses:  Fall, initial encounter    Rx / DC Orders ED Discharge Orders     None         Glyn Ade, MD 06/24/23 1706

## 2023-06-24 NOTE — ED Triage Notes (Signed)
Pt BIB EMS coming from home. Pt had a fall yesterday around 2pm going to the bathroom. States that her pain got worst today. Pain to her left knee and hip. 5/10. Not on thinners and no LOC. States having frequent falls. Pt stays with her children.  BP 134/62, HR 67, Spo2 98%, CBG 197

## 2023-06-25 ENCOUNTER — Encounter (HOSPITAL_COMMUNITY): Payer: Self-pay | Admitting: Internal Medicine

## 2023-06-25 ENCOUNTER — Observation Stay (HOSPITAL_COMMUNITY): Payer: Medicare Other | Admitting: Anesthesiology

## 2023-06-25 ENCOUNTER — Observation Stay (HOSPITAL_COMMUNITY): Payer: Medicare Other

## 2023-06-25 ENCOUNTER — Observation Stay (HOSPITAL_BASED_OUTPATIENT_CLINIC_OR_DEPARTMENT_OTHER): Payer: Medicare Other

## 2023-06-25 ENCOUNTER — Other Ambulatory Visit: Payer: Self-pay

## 2023-06-25 ENCOUNTER — Encounter (HOSPITAL_COMMUNITY)
Admission: EM | Disposition: A | Discharge status: Skilled Nursing Facility | Payer: Self-pay | Source: Home / Self Care | Attending: Student

## 2023-06-25 DIAGNOSIS — I3481 Nonrheumatic mitral (valve) annulus calcification: Secondary | ICD-10-CM | POA: Diagnosis present

## 2023-06-25 DIAGNOSIS — I509 Heart failure, unspecified: Secondary | ICD-10-CM | POA: Diagnosis present

## 2023-06-25 DIAGNOSIS — K219 Gastro-esophageal reflux disease without esophagitis: Secondary | ICD-10-CM | POA: Diagnosis present

## 2023-06-25 DIAGNOSIS — I129 Hypertensive chronic kidney disease with stage 1 through stage 4 chronic kidney disease, or unspecified chronic kidney disease: Secondary | ICD-10-CM | POA: Diagnosis not present

## 2023-06-25 DIAGNOSIS — Y92009 Unspecified place in unspecified non-institutional (private) residence as the place of occurrence of the external cause: Secondary | ICD-10-CM | POA: Diagnosis not present

## 2023-06-25 DIAGNOSIS — R531 Weakness: Secondary | ICD-10-CM | POA: Diagnosis not present

## 2023-06-25 DIAGNOSIS — E1122 Type 2 diabetes mellitus with diabetic chronic kidney disease: Secondary | ICD-10-CM | POA: Diagnosis present

## 2023-06-25 DIAGNOSIS — W1830XA Fall on same level, unspecified, initial encounter: Secondary | ICD-10-CM | POA: Diagnosis present

## 2023-06-25 DIAGNOSIS — T8484XD Pain due to internal orthopedic prosthetic devices, implants and grafts, subsequent encounter: Secondary | ICD-10-CM | POA: Diagnosis not present

## 2023-06-25 DIAGNOSIS — S72002A Fracture of unspecified part of neck of left femur, initial encounter for closed fracture: Secondary | ICD-10-CM | POA: Diagnosis present

## 2023-06-25 DIAGNOSIS — Z7401 Bed confinement status: Secondary | ICD-10-CM | POA: Diagnosis not present

## 2023-06-25 DIAGNOSIS — R54 Age-related physical debility: Secondary | ICD-10-CM | POA: Diagnosis present

## 2023-06-25 DIAGNOSIS — R1311 Dysphagia, oral phase: Secondary | ICD-10-CM | POA: Diagnosis not present

## 2023-06-25 DIAGNOSIS — M810 Age-related osteoporosis without current pathological fracture: Secondary | ICD-10-CM | POA: Diagnosis present

## 2023-06-25 DIAGNOSIS — Z8249 Family history of ischemic heart disease and other diseases of the circulatory system: Secondary | ICD-10-CM | POA: Diagnosis not present

## 2023-06-25 DIAGNOSIS — R41841 Cognitive communication deficit: Secondary | ICD-10-CM | POA: Diagnosis not present

## 2023-06-25 DIAGNOSIS — D509 Iron deficiency anemia, unspecified: Secondary | ICD-10-CM | POA: Diagnosis present

## 2023-06-25 DIAGNOSIS — Z66 Do not resuscitate: Secondary | ICD-10-CM | POA: Diagnosis present

## 2023-06-25 DIAGNOSIS — Z681 Body mass index (BMI) 19 or less, adult: Secondary | ICD-10-CM | POA: Diagnosis not present

## 2023-06-25 DIAGNOSIS — Z471 Aftercare following joint replacement surgery: Secondary | ICD-10-CM | POA: Diagnosis not present

## 2023-06-25 DIAGNOSIS — R636 Underweight: Secondary | ICD-10-CM | POA: Diagnosis not present

## 2023-06-25 DIAGNOSIS — I130 Hypertensive heart and chronic kidney disease with heart failure and stage 1 through stage 4 chronic kidney disease, or unspecified chronic kidney disease: Secondary | ICD-10-CM | POA: Diagnosis present

## 2023-06-25 DIAGNOSIS — S72009A Fracture of unspecified part of neck of unspecified femur, initial encounter for closed fracture: Secondary | ICD-10-CM | POA: Diagnosis present

## 2023-06-25 DIAGNOSIS — D631 Anemia in chronic kidney disease: Secondary | ICD-10-CM | POA: Diagnosis present

## 2023-06-25 DIAGNOSIS — Z9181 History of falling: Secondary | ICD-10-CM | POA: Diagnosis not present

## 2023-06-25 DIAGNOSIS — Z01810 Encounter for preprocedural cardiovascular examination: Secondary | ICD-10-CM

## 2023-06-25 DIAGNOSIS — M4692 Unspecified inflammatory spondylopathy, cervical region: Secondary | ICD-10-CM | POA: Diagnosis not present

## 2023-06-25 DIAGNOSIS — Z7984 Long term (current) use of oral hypoglycemic drugs: Secondary | ICD-10-CM | POA: Diagnosis not present

## 2023-06-25 DIAGNOSIS — Z01818 Encounter for other preprocedural examination: Secondary | ICD-10-CM

## 2023-06-25 DIAGNOSIS — Z96642 Presence of left artificial hip joint: Secondary | ICD-10-CM | POA: Diagnosis not present

## 2023-06-25 DIAGNOSIS — K5900 Constipation, unspecified: Secondary | ICD-10-CM | POA: Diagnosis present

## 2023-06-25 DIAGNOSIS — E785 Hyperlipidemia, unspecified: Secondary | ICD-10-CM | POA: Diagnosis present

## 2023-06-25 DIAGNOSIS — Z7189 Other specified counseling: Secondary | ICD-10-CM | POA: Diagnosis not present

## 2023-06-25 DIAGNOSIS — S72002D Fracture of unspecified part of neck of left femur, subsequent encounter for closed fracture with routine healing: Secondary | ICD-10-CM | POA: Diagnosis not present

## 2023-06-25 DIAGNOSIS — Z7982 Long term (current) use of aspirin: Secondary | ICD-10-CM | POA: Diagnosis not present

## 2023-06-25 DIAGNOSIS — N1832 Chronic kidney disease, stage 3b: Secondary | ICD-10-CM | POA: Diagnosis present

## 2023-06-25 DIAGNOSIS — K4020 Bilateral inguinal hernia, without obstruction or gangrene, not specified as recurrent: Secondary | ICD-10-CM | POA: Diagnosis present

## 2023-06-25 DIAGNOSIS — R262 Difficulty in walking, not elsewhere classified: Secondary | ICD-10-CM | POA: Diagnosis not present

## 2023-06-25 DIAGNOSIS — S72042A Displaced fracture of base of neck of left femur, initial encounter for closed fracture: Secondary | ICD-10-CM | POA: Diagnosis not present

## 2023-06-25 DIAGNOSIS — M6281 Muscle weakness (generalized): Secondary | ICD-10-CM | POA: Diagnosis not present

## 2023-06-25 DIAGNOSIS — Z79899 Other long term (current) drug therapy: Secondary | ICD-10-CM | POA: Diagnosis not present

## 2023-06-25 DIAGNOSIS — I447 Left bundle-branch block, unspecified: Secondary | ICD-10-CM | POA: Diagnosis present

## 2023-06-25 DIAGNOSIS — J952 Acute pulmonary insufficiency following nonthoracic surgery: Secondary | ICD-10-CM | POA: Diagnosis not present

## 2023-06-25 DIAGNOSIS — E43 Unspecified severe protein-calorie malnutrition: Secondary | ICD-10-CM | POA: Diagnosis present

## 2023-06-25 DIAGNOSIS — E119 Type 2 diabetes mellitus without complications: Secondary | ICD-10-CM | POA: Diagnosis not present

## 2023-06-25 DIAGNOSIS — S72032A Displaced midcervical fracture of left femur, initial encounter for closed fracture: Secondary | ICD-10-CM | POA: Diagnosis present

## 2023-06-25 HISTORY — PX: HIP ARTHROPLASTY: SHX981

## 2023-06-25 LAB — BASIC METABOLIC PANEL
Anion gap: 11 (ref 5–15)
BUN: 28 mg/dL — ABNORMAL HIGH (ref 8–23)
CO2: 22 mmol/L (ref 22–32)
Calcium: 8.9 mg/dL (ref 8.9–10.3)
Chloride: 105 mmol/L (ref 98–111)
Creatinine, Ser: 1.34 mg/dL — ABNORMAL HIGH (ref 0.44–1.00)
GFR, Estimated: 36 mL/min — ABNORMAL LOW (ref >60)
Glucose, Bld: 101 mg/dL — ABNORMAL HIGH (ref 70–99)
Potassium: 3.9 mmol/L (ref 3.5–5.1)
Sodium: 138 mmol/L (ref 135–145)

## 2023-06-25 LAB — ECHOCARDIOGRAM COMPLETE
AR max vel: 1.34 cm2
AV Area VTI: 1.23 cm2
AV Area mean vel: 1.27 cm2
AV Mean grad: 10 mm[Hg]
AV Peak grad: 18.1 mm[Hg]
Ao pk vel: 2.13 m/s
Area-P 1/2: 3.12 cm2
Calc EF: 63 %
Height: 62 in
MV VTI: 1.65 cm2
S' Lateral: 2.9 cm
Single Plane A2C EF: 59.9 %
Single Plane A4C EF: 68 %
Weight: 1568 [oz_av]

## 2023-06-25 LAB — GLUCOSE, CAPILLARY
Glucose-Capillary: 68 mg/dL — ABNORMAL LOW (ref 70–99)
Glucose-Capillary: 66 mg/dL — ABNORMAL LOW (ref 70–99)

## 2023-06-25 LAB — CBC
HCT: 30.3 % — ABNORMAL LOW (ref 36.0–46.0)
Hemoglobin: 9.4 g/dL — ABNORMAL LOW (ref 12.0–15.0)
MCH: 29.2 pg (ref 26.0–34.0)
MCHC: 31 g/dL (ref 30.0–36.0)
MCV: 94.1 fL (ref 80.0–100.0)
Platelets: 214 10*3/uL (ref 150–400)
RBC: 3.22 MIL/uL — ABNORMAL LOW (ref 3.87–5.11)
RDW: 13.7 % (ref 11.5–15.5)
WBC: 6.7 10*3/uL (ref 4.0–10.5)
nRBC: 0 % (ref 0.0–0.2)

## 2023-06-25 LAB — PROTIME-INR
INR: 1.1 (ref 0.8–1.2)
Prothrombin Time: 14 s (ref 11.4–15.2)

## 2023-06-25 LAB — SURGICAL PCR SCREEN
MRSA, PCR: NEGATIVE
Staphylococcus aureus: NEGATIVE

## 2023-06-25 LAB — TYPE AND SCREEN
ABO/RH(D): A POS
Antibody Screen: NEGATIVE

## 2023-06-25 SURGERY — HEMIARTHROPLASTY, HIP, DIRECT ANTERIOR APPROACH, FOR FRACTURE
Anesthesia: Monitor Anesthesia Care | Site: Hip | Laterality: Left

## 2023-06-25 MED ORDER — ASPIRIN 81 MG PO TBEC
81.0000 mg | DELAYED_RELEASE_TABLET | Freq: Two times a day (BID) | ORAL | Status: DC
  Administered 2023-06-26 – 2023-06-29 (×7): 81 mg via ORAL
  Filled 2023-06-25 (×7): qty 1

## 2023-06-25 MED ORDER — PHENYLEPHRINE HCL-NACL 20-0.9 MG/250ML-% IV SOLN
INTRAVENOUS | Status: DC | PRN
  Administered 2023-06-25: 50 ug/min via INTRAVENOUS

## 2023-06-25 MED ORDER — ONDANSETRON HCL 4 MG/2ML IJ SOLN: 4.0000 mg | Freq: Once | INTRAMUSCULAR | Status: DC | PRN

## 2023-06-25 MED ORDER — ALBUMIN HUMAN 5 % IV SOLN: INTRAVENOUS | Status: DC | PRN

## 2023-06-25 MED ORDER — POVIDONE-IODINE 10 % EX SWAB: 2.0000 | Freq: Once | CUTANEOUS | Status: DC

## 2023-06-25 MED ORDER — FENTANYL CITRATE (PF) 100 MCG/2ML IJ SOLN
INTRAMUSCULAR | Status: AC
  Filled 2023-06-25: qty 2

## 2023-06-25 MED ORDER — SODIUM CHLORIDE (PF) 0.9 % IJ SOLN
INTRAMUSCULAR | Status: AC
Start: 2023-06-25 — End: ?
  Filled 2023-06-25: qty 10

## 2023-06-25 MED ORDER — CEFAZOLIN SODIUM-DEXTROSE 2-4 GM/100ML-% IV SOLN
INTRAVENOUS | Status: AC
  Filled 2023-06-25: qty 100

## 2023-06-25 MED ORDER — CEFAZOLIN SODIUM-DEXTROSE 2-4 GM/100ML-% IV SOLN
2.0000 g | Freq: Three times a day (TID) | INTRAVENOUS | Status: AC
  Administered 2023-06-25 – 2023-06-26 (×2): 2 g via INTRAVENOUS
  Filled 2023-06-25 (×2): qty 100

## 2023-06-25 MED ORDER — ONDANSETRON HCL 4 MG/2ML IJ SOLN
INTRAMUSCULAR | Status: AC
  Filled 2023-06-25: qty 2

## 2023-06-25 MED ORDER — WATER FOR IRRIGATION, STERILE IR SOLN
Status: DC | PRN
  Administered 2023-06-25: 1000 mL

## 2023-06-25 MED ORDER — BUPIVACAINE IN DEXTROSE 0.75-8.25 % IT SOLN
INTRATHECAL | Status: DC | PRN
  Administered 2023-06-25: 1.8 mL via INTRATHECAL

## 2023-06-25 MED ORDER — PROPOFOL 1000 MG/100ML IV EMUL
INTRAVENOUS | Status: AC
  Filled 2023-06-25: qty 100

## 2023-06-25 MED ORDER — FENTANYL CITRATE (PF) 100 MCG/2ML IJ SOLN
INTRAMUSCULAR | Status: DC | PRN
  Administered 2023-06-25 (×2): 25 ug via INTRAVENOUS

## 2023-06-25 MED ORDER — LACTATED RINGERS IV SOLN: INTRAVENOUS | Status: DC | PRN

## 2023-06-25 MED ORDER — CHLORHEXIDINE GLUCONATE 4 % EX SOLN: 60.0000 mL | Freq: Once | CUTANEOUS | Status: DC

## 2023-06-25 MED ORDER — SODIUM CHLORIDE (PF) 0.9 % IJ SOLN
INTRAMUSCULAR | Status: AC
  Filled 2023-06-25: qty 50

## 2023-06-25 MED ORDER — BUPIVACAINE LIPOSOME 1.3 % IJ SUSP
INTRAMUSCULAR | Status: AC
  Filled 2023-06-25: qty 20

## 2023-06-25 MED ORDER — ISOPROPYL ALCOHOL 70 % SOLN
Status: DC | PRN
  Administered 2023-06-25: 1 via TOPICAL

## 2023-06-25 MED ORDER — PHENYLEPHRINE HCL-NACL 20-0.9 MG/250ML-% IV SOLN
INTRAVENOUS | Status: AC
  Filled 2023-06-25: qty 250

## 2023-06-25 MED ORDER — ONDANSETRON HCL 4 MG/2ML IJ SOLN
INTRAMUSCULAR | Status: DC | PRN
  Administered 2023-06-25: 4 mg via INTRAVENOUS

## 2023-06-25 MED ORDER — PROPOFOL 500 MG/50ML IV EMUL
INTRAVENOUS | Status: DC | PRN
  Administered 2023-06-25: 80 ug/kg/min via INTRAVENOUS

## 2023-06-25 MED ORDER — ACETAMINOPHEN 10 MG/ML IV SOLN
INTRAVENOUS | Status: AC
  Filled 2023-06-25: qty 100

## 2023-06-25 MED ORDER — TRANEXAMIC ACID-NACL 1000-0.7 MG/100ML-% IV SOLN
1000.0000 mg | INTRAVENOUS | Status: AC
  Administered 2023-06-25: 1000 mg via INTRAVENOUS

## 2023-06-25 MED ORDER — SODIUM CHLORIDE 0.9 % IR SOLN
Status: DC | PRN
  Administered 2023-06-25: 1000 mL

## 2023-06-25 MED ORDER — TRANEXAMIC ACID-NACL 1000-0.7 MG/100ML-% IV SOLN
INTRAVENOUS | Status: AC
  Filled 2023-06-25: qty 100

## 2023-06-25 MED ORDER — BUPIVACAINE-EPINEPHRINE 0.25% -1:200000 IJ SOLN
INTRAMUSCULAR | Status: AC
  Filled 2023-06-25: qty 1

## 2023-06-25 MED ORDER — DEXMEDETOMIDINE HCL IN NACL 80 MCG/20ML IV SOLN
INTRAVENOUS | Status: DC | PRN
  Administered 2023-06-25: 8 ug via INTRAVENOUS

## 2023-06-25 MED ORDER — ACETAMINOPHEN 500 MG PO TABS
ORAL_TABLET | ORAL | Status: AC
  Filled 2023-06-25: qty 1

## 2023-06-25 MED ORDER — CEFAZOLIN SODIUM-DEXTROSE 2-4 GM/100ML-% IV SOLN
2.0000 g | INTRAVENOUS | Status: AC
Start: 2023-06-25 — End: 2023-06-25
  Administered 2023-06-25: 2 g via INTRAVENOUS

## 2023-06-25 MED ORDER — 0.9 % SODIUM CHLORIDE (POUR BTL) OPTIME
TOPICAL | Status: DC | PRN
  Administered 2023-06-25: 1000 mL

## 2023-06-25 MED ORDER — BUPIVACAINE-EPINEPHRINE 0.25% -1:200000 IJ SOLN
INTRAMUSCULAR | Status: DC | PRN
  Administered 2023-06-25: 50 mL

## 2023-06-25 MED ORDER — ACETAMINOPHEN 500 MG PO TABS
500.0000 mg | ORAL_TABLET | Freq: Once | ORAL | Status: AC
  Administered 2023-06-25: 500 mg via ORAL

## 2023-06-25 MED ORDER — FENTANYL CITRATE PF 50 MCG/ML IJ SOSY: 25.0000 ug | PREFILLED_SYRINGE | INTRAMUSCULAR | Status: DC | PRN

## 2023-06-25 SURGICAL SUPPLY — 67 items
BAG COUNTER SPONGE SURGICOUNT (BAG) IMPLANT
BAG DECANTER FOR FLEXI CONT (MISCELLANEOUS) ×2 IMPLANT
BAG ZIPLOCK 12X15 (MISCELLANEOUS) ×2 IMPLANT
BLADE SAW SAG 25X90X1.19 (BLADE) ×2 IMPLANT
BRUSH FEMORAL CANAL (MISCELLANEOUS) IMPLANT
CEMENT BONE SIMPLEX SPEEDSET (Cement) IMPLANT
CEMENT RESTRICTOR BONE PREP ST (Cement) IMPLANT
CHLORAPREP W/TINT 26 (MISCELLANEOUS) ×4 IMPLANT
COVER SURGICAL LIGHT HANDLE (MISCELLANEOUS) ×2 IMPLANT
DERMABOND ADVANCED .7 DNX12 (GAUZE/BANDAGES/DRESSINGS) ×2 IMPLANT
DRAPE 3/4 80X56 (DRAPES) ×4 IMPLANT
DRAPE HIP W/POCKET STRL (MISCELLANEOUS) ×2 IMPLANT
DRAPE INCISE IOBAN 66X45 STRL (DRAPES) ×2 IMPLANT
DRAPE INCISE IOBAN 85X60 (DRAPES) ×2 IMPLANT
DRAPE POUCH INSTRU U-SHP 10X18 (DRAPES) ×2 IMPLANT
DRAPE SHEET LG 3/4 BI-LAMINATE (DRAPES) ×6 IMPLANT
DRAPE SURG 17X11 SM STRL (DRAPES) IMPLANT
DRAPE U-SHAPE 47X51 STRL (DRAPES) ×4 IMPLANT
DRESSING AQUACEL AG SP 3.5X10 (GAUZE/BANDAGES/DRESSINGS) ×2 IMPLANT
DRSG AQUACEL AG ADV 3.5X 6 (GAUZE/BANDAGES/DRESSINGS) ×2 IMPLANT
DRSG AQUACEL AG ADV 3.5X10 (GAUZE/BANDAGES/DRESSINGS) IMPLANT
DRSG AQUACEL AG SP 3.5X10 (GAUZE/BANDAGES/DRESSINGS) ×1 IMPLANT
ELECT BLADE TIP CTD 4 INCH (ELECTRODE) ×2 IMPLANT
ELECT REM PT RETURN 15FT ADLT (MISCELLANEOUS) ×2 IMPLANT
GLOVE BIOGEL PI IND STRL 8 (GLOVE) ×4 IMPLANT
GLOVE SURG LX STRL 8.0 MICRO (GLOVE) ×4 IMPLANT
GLOVE SURG ORTHO 8.0 STRL STRW (GLOVE) ×2 IMPLANT
GOWN STRL REUS W/ TWL XL LVL3 (GOWN DISPOSABLE) ×2 IMPLANT
HEAD CERAMIC V40 BIOLOX DEL 28 (Orthopedic Implant) IMPLANT
HEAD COMP BIPOLAR 28X45 (Head) IMPLANT
HOLDER FOLEY CATH W/STRAP (MISCELLANEOUS) ×2 IMPLANT
HOOD PEEL AWAY T7 (MISCELLANEOUS) ×6 IMPLANT
JET LAVAGE IRRISEPT WOUND (IRRIGATION / IRRIGATOR) IMPLANT
KIT BASIN OR (CUSTOM PROCEDURE TRAY) ×2 IMPLANT
KIT TURNOVER KIT A (KITS) IMPLANT
LAVAGE JET IRRISEPT WOUND (IRRIGATION / IRRIGATOR) IMPLANT
MANIFOLD NEPTUNE II (INSTRUMENTS) ×2 IMPLANT
MARKER SKIN DUAL TIP RULER LAB (MISCELLANEOUS) ×2 IMPLANT
NS IRRIG 1000ML POUR BTL (IV SOLUTION) ×2 IMPLANT
PACK TOTAL JOINT (CUSTOM PROCEDURE TRAY) ×2 IMPLANT
PROTECTOR NERVE ULNAR (MISCELLANEOUS) ×2 IMPLANT
RETRIEVER SUT HEWSON (MISCELLANEOUS) ×2 IMPLANT
SEALER BIPOLAR AQUA 6.0 (INSTRUMENTS) IMPLANT
SET HNDPC FAN SPRY TIP SCT (DISPOSABLE) ×2 IMPLANT
SET INTERPULSE LAVAGE W/TIP (ORTHOPEDIC DISPOSABLE SUPPLIES) ×2 IMPLANT
SLEEVE STOCKINETTE LIMB 4X8 (MISCELLANEOUS) ×2 IMPLANT
SLEEVE STOCKINETTE LIMB 6X9 (MISCELLANEOUS) ×2 IMPLANT
SPIKE FLUID TRANSFER (MISCELLANEOUS) ×6 IMPLANT
STAPLER SKIN PROX WIDE 3.9 (STAPLE) IMPLANT
STEM HIP ACCOLADE SZ4 35X137 (Stem) IMPLANT
SUCTION TUBE FRAZIER 12FR DISP (SUCTIONS) ×2 IMPLANT
SUT BONE WAX W31G (SUTURE) ×2 IMPLANT
SUT ETHIBOND #5 BRAIDED 30INL (SUTURE) ×2 IMPLANT
SUT MNCRL AB 3-0 PS2 18 (SUTURE) ×2 IMPLANT
SUT STRATAFIX 0 PDS 27 VIOLET (SUTURE) ×1 IMPLANT
SUT STRATAFIX 14 PDO 48 VLT (SUTURE) ×2 IMPLANT
SUT STRATAFIX 1PDS 45CM VIOLET (SUTURE) ×2 IMPLANT
SUT STRATAFIX PDO 1 14 VIOLET (SUTURE) ×1 IMPLANT
SUT VIC AB 2-0 CT2 27 (SUTURE) ×4 IMPLANT
SUTURE STRATFX 0 PDS 27 VIOLET (SUTURE) ×2 IMPLANT
SYR 20ML LL LF (SYRINGE) ×2 IMPLANT
TOWEL OR 17X26 10 PK STRL BLUE (TOWEL DISPOSABLE) ×2 IMPLANT
TOWER CARTRIDGE SMART MIX (DISPOSABLE) ×2 IMPLANT
TRAY FOLEY MTR SLVR 16FR STAT (SET/KITS/TRAYS/PACK) ×2 IMPLANT
TUBE KAMVAC SUCTION (TUBING) IMPLANT
TUBE SUCTION HIGH CAP CLEAR NV (SUCTIONS) ×2 IMPLANT
WATER STERILE IRR 1000ML POUR (IV SOLUTION) ×4 IMPLANT

## 2023-06-25 NOTE — Progress Notes (Signed)
OT Cancellation Note  Patient Details Name: Brandi Mccoy MRN: [id number] DOB: 03/02/1922   Cancelled Treatment:    Reason Eval/Treat Not Completed: Patient not medically ready Pt with left hip fracture with scheduled Left hip hemiarthroplasty today. Will hold OT eval and follow up with patient post op.   Limmie Patricia, OTR/L,CBIS  Supplemental OT - [facility] and [facility] Secure Chat Preferred   06/25/2023, 8:26 AM

## 2023-06-25 NOTE — Plan of Care (Signed)
°  Problem: Education: Goal: Knowledge of General Education information will improve Description: Including pain rating scale, medication(s)/side effects and non-pharmacologic comfort measures Outcome: Progressing   Problem: Clinical Measurements: Goal: Ability to maintain clinical measurements within normal limits will improve Outcome: Progressing   Problem: Coping: Goal: Level of anxiety will decrease Outcome: Progressing   Problem: Pain Management: Goal: General experience of comfort will improve Outcome: Progressing   Problem: Safety: Goal: Ability to remain free from injury will improve Outcome: Progressing

## 2023-06-25 NOTE — Anesthesia Postprocedure Evaluation (Signed)
Anesthesia Post Note  Patient: Brandi Mccoy  Procedure(s) Performed: ARTHROPLASTY BIPOLAR HIP (HEMIARTHROPLASTY) (Left: Hip)     Patient location during evaluation: PACU Anesthesia Type: MAC and Spinal Level of consciousness: awake and alert and oriented Pain management: pain level controlled Vital Signs Assessment: post-procedure vital signs reviewed and stable Respiratory status: spontaneous breathing, nonlabored ventilation and respiratory function stable Cardiovascular status: blood pressure returned to baseline and stable Postop Assessment: no headache, no backache, spinal receding and no apparent nausea or vomiting Anesthetic complications: no   No notable events documented.  Last Vitals:  Vitals:   06/25/23 1430 06/25/23 1445  BP: (!) 150/63 (!) 149/91  Pulse: 66 65  Resp: 16 16  Temp:    SpO2: 98% 97%    Last Pain:  Vitals:   06/25/23 1445  TempSrc:   PainSc: 0-No pain                 Lannie Fields

## 2023-06-25 NOTE — Plan of Care (Signed)
°  Problem: Clinical Measurements: Goal: Ability to maintain clinical measurements within normal limits will improve Outcome: Progressing   Problem: Safety: Goal: Ability to remain free from injury will improve Outcome: Progressing   Problem: Activity: Goal: Ability to tolerate increased activity will improve Outcome: Progressing   Problem: Pain Management: Goal: Pain level will decrease with appropriate interventions Outcome: Progressing

## 2023-06-25 NOTE — Anesthesia Procedure Notes (Signed)
Spinal  Patient location during procedure: OR Start time: 06/25/2023 12:25 PM End time: 06/25/2023 12:31 PM Reason for block: surgical anesthesia Staffing Performed: anesthesiologist  Anesthesiologist: Lannie Fields, DO Performed by: Lannie Fields, DO Authorized by: Lannie Fields, DO   Preanesthetic Checklist Completed: patient identified, IV checked, risks and benefits discussed, surgical consent, monitors and equipment checked, pre-op evaluation and timeout performed Spinal Block Patient position: left lateral decubitus Prep: DuraPrep and site prepped and draped Patient monitoring: cardiac monitor, continuous pulse ox and blood pressure Approach: midline Location: L3-4 Injection technique: single-shot Needle Needle type: Pencan  Needle gauge: 24 G Needle length: 9 cm Assessment Sensory level: T6 Events: CSF return Additional Notes Functioning IV was confirmed and monitors were applied. Sterile prep and drape, including hand hygiene and sterile gloves were used. The patient was positioned and the spine was prepped. The skin was anesthetized with lidocaine.  Free flow of clear CSF was obtained prior to injecting local anesthetic into the CSF.  The spinal needle aspirated freely following injection.  The needle was carefully withdrawn.  The patient tolerated the procedure well.

## 2023-06-25 NOTE — Progress Notes (Signed)
°  Echocardiogram 2D Echocardiogram has been performed.  Janalyn Harder 06/25/2023, 8:25 AM

## 2023-06-25 NOTE — Progress Notes (Signed)
PROGRESS NOTE  Brandi Mccoy ZOX:[id number] DOB: 09/25/1921   PCP: Linus Galas, NP  Patient is from: Home.  Lives alone but children alternate.  Ambulates with rolling walker.  DOA: 06/24/2023 LOS: 0  Chief complaints Chief Complaint  Patient presents with   Fall   Hip Pain     Brief Narrative / Interim history: 89 year old F with PMH of DM-2, CKD-3B, HTN, HLD, GERD, depression, LBBB, and first-degree AVB presented to ED with left hip pain after accidental ground-level fall, and admitted with left femoral neck fracture.  Patient was walking around the house when her left leg lost control and she fell on the left side.  Denies hitting her head or LOC.  No prodromes.  Left hip x-ray with impacted transcervical left femoral neck fracture.  CT of left hip showed acute transcervical fracture of proximal left femur with mild impaction, age-indeterminate nondisplaced fracture through the proximal segment of the coccyx and bilateral inguinal hernias.  Left knee x-ray without acute finding.  EKG sinus rhythm with first-degree AVB and LBBB unchanged from prior.  Orthopedic surgery consulted.  Echocardiogram ordered.  Subjective: Seen and examined earlier this morning.  No major events overnight of this morning.  No complaints at this point some pain in her left knee.  She denies chest pain, dyspnea, palpitation, GI or UTI symptoms.  He denies numbness or tingling.  Daughter at bedside.  Objective: Vitals:   06/24/23 1814 06/24/23 2120 06/25/23 0153 06/25/23 0641  BP: (!) 168/58 (!) 157/72 (!) 138/58 (!) 158/89  Pulse: 66 70 65 69  Resp: 13 17 17 18   Temp: 98 F (36.7 C) 98 F (36.7 C) 97.9 F (36.6 C) 98.2 F (36.8 C)  TempSrc: Oral Oral Oral Oral  SpO2: 98% 96% 95% 97%  Weight:      Height:        Examination:  GENERAL: No apparent distress.  Nontoxic. Appears frail HEENT: MMM.  Vision and hearing grossly intact.  NECK: Supple.  No apparent JVD.  RESP:  No IWOB.  Fair aeration  bilaterally. CVS:  RRR. Heart sounds normal.  ABD/GI/GU: BS+. Abd soft, NTND.  MSK/EXT: No apparent deformity.  Significant muscle mass and subcu fat loss.  Does not move left leg due to pain SKIN: no apparent skin lesion or wound NEURO: Awake, alert and oriented appropriately.  No apparent focal neuro deficit. PSYCH: Calm. Normal affect.   Procedures:  None  Microbiology summarized: MRSA PCR screen nonreactive  Assessment and plan: Mechanical fall at home-ground-level fall.  No prodromes.  Did not hit head or lose consciousness. Acute left femoral neck fracture due to fall -Patient independently ambulates using rolling walker at baseline. -Orthopedic surgery consulted and planning surgical repair of fracture. -EKG with first-degree AVB and LBBB which are chronic.  TTE without significant finding other than severe mitral annular calcification with mild stenosis and mild MR.  She has no arrhythmia or cardiopulmonary symptoms.  She is optimized from cardiopulmonary standpoint but at her age remains significant risk.  -Pain control  CKD-3B: Stable Recent Labs    08/01/22 1625 08/12/22 1442 06/24/23 1558 06/25/23 0321  BUN 28 29 25* 28*  CREATININE 1.94* 1.49* 1.28* 1.34*  -Continue monitoring   Essential hypertension: BP slightly elevated.  Partly due to pain. -Continue amlodipine, hydralazine  Hyperlipidemia -Continue statin-but doubt utility.   Chronic anemia-slight drop in Hgb likely dilutional. Recent Labs    06/24/23 1620 06/25/23 0321  HGB 10.7* 9.4*  -Continue monitoring  Bilateral inguinal  hernia: Noted on CT.  No incarceration.  Advance care planning: Discussed CODE STATUS with patient and patient's daughter at bedside.  Patient is not interested in cardiopulmonary resuscitation in event of sudden cardiopulmonary arrest.  Daughter in agreement.  However, they are okay reversing CODE STATUS for surgical procedure.     Underweight Body mass index is 17.92  kg/m. -Consult dietitian          DVT prophylaxis:  SCD pending surgery  Code Status: DNR/DNI-discussed with patient and patient's daughter at bedside Family Communication: Updated patient's daughter at bedside Level of care: Med-Surg Status is: Observation The patient will require care spanning > 2 midnights and should be moved to inpatient because: Acute left femoral neck fracture   Final disposition: Likely SNF. Consultants:  Orthopedic surgery  55 minutes with more than 50% spent in reviewing records, counseling patient/family and coordinating care.   Sch Meds:  Scheduled Meds:  amLODipine  5 mg Oral Daily   chlorhexidine  60 mL Topical Once   hydrALAZINE  50 mg Oral TID   povidone-iodine  2 Application Topical Once   pravastatin  10 mg Oral Daily   Continuous Infusions:  acetaminophen     ceFAZolin      ceFAZolin (ANCEF) IV     tranexamic acid     tranexamic acid     PRN Meds:.acetaminophen, acetaminophen **OR** acetaminophen, ceFAZolin, fentaNYL (SUBLIMAZE) injection, HYDROcodone-acetaminophen, melatonin, ondansetron **OR** ondansetron (ZOFRAN) IV, polyethylene glycol, tranexamic acid  Antimicrobials: Anti-infectives (From admission, onward)    Start     Dose/Rate Route Frequency Ordered Stop   06/25/23 0845  ceFAZolin (ANCEF) IVPB 2g/100 mL premix        2 g 200 mL/hr over 30 Minutes Intravenous On call to O.R. 06/25/23 0746 06/26/23 0559   06/25/23 0747  ceFAZolin (ANCEF) 2-4 GM/100ML-% IVPB       Note to Pharmacy: Myrlene Broker M: cabinet override      06/25/23 0747 06/25/23 1959        I have personally reviewed the following labs and images: CBC: Recent Labs  Lab 06/24/23 1620 06/25/23 0321  WBC 7.9 6.7  NEUTROABS 6.0  --   HGB 10.7* 9.4*  HCT 34.6* 30.3*  MCV 93.3 94.1  PLT 228 214   BMP &GFR Recent Labs  Lab 06/24/23 1558 06/25/23 0321  NA 139 138  K 4.0 3.9  CL 107 105  CO2 21* 22  GLUCOSE 120* 101*  BUN 25* 28*  CREATININE  1.28* 1.34*  CALCIUM 9.3 8.9   Estimated Creatinine Clearance: 16.1 mL/min (A) (by C-G formula based on SCr of 1.34 mg/dL (H)). Liver & Pancreas: No results for input(s): "AST", "ALT", "ALKPHOS", "BILITOT", "PROT", "ALBUMIN" in the last 168 hours. No results for input(s): "LIPASE", "AMYLASE" in the last 168 hours. No results for input(s): "AMMONIA" in the last 168 hours. Diabetic: No results for input(s): "HGBA1C" in the last 72 hours. No results for input(s): "GLUCAP" in the last 168 hours. Cardiac Enzymes: No results for input(s): "CKTOTAL", "CKMB", "CKMBINDEX", "TROPONINI" in the last 168 hours. No results for input(s): "PROBNP" in the last 8760 hours. Coagulation Profile: Recent Labs  Lab 06/25/23 0321  INR 1.1   Thyroid Function Tests: No results for input(s): "TSH", "T4TOTAL", "FREET4", "T3FREE", "THYROIDAB" in the last 72 hours. Lipid Profile: No results for input(s): "CHOL", "HDL", "LDLCALC", "TRIG", "CHOLHDL", "LDLDIRECT" in the last 72 hours. Anemia Panel: No results for input(s): "VITAMINB12", "FOLATE", "FERRITIN", "TIBC", "IRON", "RETICCTPCT" in the last 72  hours. Urine analysis: No results found for: "COLORURINE", "APPEARANCEUR", "LABSPEC", "PHURINE", "GLUCOSEU", "HGBUR", "BILIRUBINUR", "KETONESUR", "PROTEINUR", "UROBILINOGEN", "NITRITE", "LEUKOCYTESUR" Sepsis Labs: Invalid input(s): "PROCALCITONIN", "LACTICIDVEN"  Microbiology: Recent Results (from the past 240 hours)  Surgical PCR screen     Status: None   Collection Time: 06/25/23  1:59 AM   Specimen: Nasal Mucosa; Nasal Swab  Result Value Ref Range Status   MRSA, PCR NEGATIVE NEGATIVE Final   Staphylococcus aureus NEGATIVE NEGATIVE Final    Comment: (NOTE) The Xpert SA Assay (FDA approved for NASAL specimens in patients 35 years of age and older), is one component of a comprehensive surveillance program. It is not intended to diagnose infection nor to guide or monitor treatment. Performed at [facility], [address], [place], [place] [address]     Radiology Studies: ECHOCARDIOGRAM COMPLETE Result Date: 06/25/2023    ECHOCARDIOGRAM REPORT   Patient Name:   OMEKA REICHERT Date of Exam: 06/25/2023 Medical Rec #:  [id number]      Height:       62.0 in Accession #:    [id number]     Weight:       98.0 lb Date of Birth:  [birth year]      BSA:          1.412 m Patient Age:    89 years       BP:           158/89 mmHg Patient Gender: F              HR:           70 bpm. Exam Location:  Inpatient Procedure: 2D Echo, Cardiac Doppler and Color Doppler STAT ECHO Indications:    Z01.818 Encounter for other preprocedural examination  History:        Patient has prior history of Echocardiogram examinations, most                 recent 02/25/2019. Aortic Valve Disease and Mitral Valve Disease,                 Signs/Symptoms:Dyspnea and Shortness of Breath; Risk                 Factors:Diabetes, Hypertension and Dyslipidemia.  Sonographer:    Sheralyn Boatman RDCS Referring Phys: [id number] Boyce Medici Azazel Franze  Sonographer Comments: Suboptimal parasternal window. Patient has extremely thin habitus. Broken hip, supine position. IMPRESSIONS  1. Left ventricular ejection fraction, by estimation, is 60 to 65%. The left ventricle has normal function. The left ventricle has no regional wall motion abnormalities. Left ventricular diastolic parameters are consistent with Grade I diastolic dysfunction (impaired relaxation).  2. Right ventricular systolic function is normal. The right ventricular size is normal. There is mildly elevated pulmonary artery systolic pressure.  3. Severe posterior annular calcification extending sub     chordally.     . The mitral valve is degenerative. Mild mitral valve regurgitation. Mild mitral stenosis. Severe mitral annular calcification.  4. The aortic valve is calcified. Aortic valve regurgitation is not visualized. Mild aortic valve stenosis. FINDINGS  Left Ventricle: Left ventricular ejection  fraction, by estimation, is 60 to 65%. The left ventricle has normal function. The left ventricle has no regional wall motion abnormalities. The left ventricular internal cavity size was small. There is no left ventricular hypertrophy. Left ventricular diastolic parameters are consistent with Grade I diastolic dysfunction (impaired relaxation). Right Ventricle: The right ventricular size is normal. No increase in right  ventricular wall thickness. Right ventricular systolic function is normal. There is mildly elevated pulmonary artery systolic pressure. The tricuspid regurgitant velocity is 2.86  m/s, and with an assumed right atrial pressure of 8 mmHg, the estimated right ventricular systolic pressure is 40.7 mmHg. Left Atrium: Left atrial size was normal in size. Right Atrium: Right atrial size was normal in size. Pericardium: There is no evidence of pericardial effusion. Mitral Valve: Severe posterior annular calcification extending sub chordally. The mitral valve is degenerative in appearance. Severe mitral annular calcification. Mild mitral valve regurgitation. Mild mitral valve stenosis. MV peak gradient, 9.7 mmHg. The mean mitral valve gradient is 4.0 mmHg. Tricuspid Valve: The tricuspid valve is normal in structure. Tricuspid valve regurgitation is mild . No evidence of tricuspid stenosis. Aortic Valve: The aortic valve is calcified. Aortic valve regurgitation is not visualized. Mild aortic stenosis is present. Aortic valve mean gradient measures 10.0 mmHg. Aortic valve peak gradient measures 18.1 mmHg. Aortic valve area, by VTI measures 1.23 cm. Pulmonic Valve: The pulmonic valve was not well visualized. Pulmonic valve regurgitation is not visualized. No evidence of pulmonic stenosis. Aorta: The aortic root and ascending aorta are structurally normal, with no evidence of dilitation. IAS/Shunts: No atrial level shunt detected by color flow Doppler.  LEFT VENTRICLE PLAX 2D LVIDd:         3.90 cm      Diastology LVIDs:         2.90 cm     LV e' medial:    3.81 cm/s LV PW:         1.20 cm     LV E/e' medial:  19.9 LV IVS:        0.90 cm     LV e' lateral:   5.98 cm/s LVOT diam:     1.90 cm     LV E/e' lateral: 12.7 LV SV:         55 LV SV Index:   39 LVOT Area:     2.84 cm  LV Volumes (MOD) LV vol d, MOD A2C: 62.9 ml LV vol d, MOD A4C: 40.6 ml LV vol s, MOD A2C: 25.2 ml LV vol s, MOD A4C: 13.0 ml LV SV MOD A2C:     37.7 ml LV SV MOD A4C:     40.6 ml LV SV MOD BP:      33.2 ml RIGHT VENTRICLE             IVC RV S prime:     11.90 cm/s  IVC diam: 1.40 cm TAPSE (M-mode): 2.1 cm LEFT ATRIUM             Index        RIGHT ATRIUM          Index LA diam:        2.80 cm 1.98 cm/m   RA Area:     8.43 cm LA Vol (A2C):   23.9 ml 16.93 ml/m  RA Volume:   15.30 ml 10.84 ml/m LA Vol (A4C):   20.7 ml 14.66 ml/m LA Biplane Vol: 23.2 ml 16.43 ml/m  AORTIC VALVE AV Area (Vmax):    1.34 cm AV Area (Vmean):   1.27 cm AV Area (VTI):     1.23 cm AV Vmax:           213.00 cm/s AV Vmean:          144.000 cm/s AV VTI:            0.447  m AV Peak Grad:      18.1 mmHg AV Mean Grad:      10.0 mmHg LVOT Vmax:         101.00 cm/s LVOT Vmean:        64.300 cm/s LVOT VTI:          0.194 m LVOT/AV VTI ratio: 0.43  AORTA Ao Root diam: 3.10 cm Ao Asc diam:  3.00 cm MITRAL VALVE                TRICUSPID VALVE MV Area (PHT): 3.12 cm     TR Peak grad:   32.7 mmHg MV Area VTI:   1.65 cm     TR Vmax:        286.00 cm/s MV Peak grad:  9.7 mmHg MV Mean grad:  4.0 mmHg     SHUNTS MV Vmax:       1.56 m/s     Systemic VTI:  0.19 m MV Vmean:      88.1 cm/s    Systemic Diam: 1.90 cm MV Decel Time: 243 msec MV E velocity: 75.80 cm/s MV A velocity: 135.00 cm/s MV E/A ratio:  0.56 Kardie Tobb DO Electronically signed by Thomasene Ripple DO Signature Date/Time: 06/25/2023/8:45:24 AM    Final    CT Hip Left Wo Contrast Result Date: 06/24/2023 CLINICAL DATA:  Hip pain, stress fracture suspected, neg xray EXAM: CT OF THE LEFT HIP WITHOUT CONTRAST TECHNIQUE:  Multidetector CT imaging of the left hip was performed according to the standard protocol. Multiplanar CT image reconstructions were also generated. RADIATION DOSE REDUCTION: This exam was performed according to the departmental dose-optimization program which includes automated exposure control, adjustment of the mA and/or kV according to patient size and/or use of iterative reconstruction technique. COMPARISON:  X-ray 06/24/2023 FINDINGS: Bones/Joint/Cartilage Acute transcervical fracture of the proximal left femur, mildly impacted. No evidence of fracture extension into the intertrochanteric region. Hip joint is intact without dislocation. Moderate osteoarthritic changes of the hip joint with chondrocalcinosis. Age indeterminate nondisplaced fracture through the proximal segment of the coccyx (series 7, image 52). The remaining visualized bony pelvis intact. No additional fractures. No lytic or sclerotic bone lesion. Ligaments Suboptimally assessed by CT. Muscles and Tendons No acute musculotendinous abnormality by CT. Tendinopathy of the left hamstring tendon origin and bilateral adductor tendon origins. Soft tissues No fluid collection or hematoma about the hip. Bilateral inguinal hernias on the left containing fat and on the right containing bowel. Colonic diverticulosis. Atherosclerotic vascular calcifications. IMPRESSION: 1. Acute transcervical fracture of the proximal left femur, mildly impacted. 2. Age indeterminate nondisplaced fracture through the proximal segment of the coccyx. 3. Bilateral inguinal hernias on the left containing fat and on the right containing bowel. Electronically Signed   By: Duanne Guess D.O.   On: 06/24/2023 17:59   DG Knee 2 Views Left Result Date: 06/24/2023 CLINICAL DATA:  Fall.  Left knee pain. EXAM: LEFT KNEE - 1-2 VIEW COMPARISON:  04/04/2022. FINDINGS: No acute fracture or dislocation. No aggressive osseous lesion. There are degenerative changes of the knee joint in  the form of mildly reduced tibio-femoral compartment joint space, tibial spiking and osteophytosis. Note is made of meniscal chondrocalcinosis. No knee effusion or focal soft tissue swelling. No radiopaque foreign bodies. IMPRESSION: *No acute osseous abnormality of the left knee joint. Mild-to-moderate degenerative changes. Electronically Signed   By: Jules Schick M.D.   On: 06/24/2023 15:25   DG HIP UNILAT W OR W/O PELVIS 2-3 VIEWS  LEFT Result Date: 06/24/2023 CLINICAL DATA:  Fall. EXAM: DG HIP (WITH OR WITHOUT PELVIS) 2-3V LEFT COMPARISON:  None Available. FINDINGS: There is impacted transcervical fracture of the left femoral neck. No other acute fracture or dislocation. No aggressive osseous lesion. Visualized sacral arcuate lines are unremarkable. Unremarkable symphysis pubis. There are mild degenerative changes of bilateral hip joints without significant joint space narrowing. Osteophytosis of the superior acetabulum. No radiopaque foreign bodies. IMPRESSION: *Impacted transcervical left femoral neck fracture. Electronically Signed   By: Jules Schick M.D.   On: 06/24/2023 15:24      Clance Baquero T. Astha Probasco [facility]  If 7PM-7AM, please contact night-coverage [email or web address] 06/25/2023, 8:56 AM

## 2023-06-25 NOTE — Discharge Instructions (Signed)
INSTRUCTIONS AFTER JOINT REPLACEMENT   Remove items at home which could result in a fall. This includes throw rugs or furniture in walking pathways ICE to the affected joint every three hours while awake for 30 minutes at a time, for at least the first 3-5 days, and then as needed for pain and swelling.  Continue to use ice for pain and swelling. You may notice swelling that will progress down to the foot and ankle.  This is normal after surgery.  Elevate your leg when you are not up walking on it.   Continue to use the breathing machine you got in the hospital (incentive spirometer) which will help keep your temperature down.  It is common for your temperature to cycle up and down following surgery, especially at night when you are not up moving around and exerting yourself.  The breathing machine keeps your lungs expanded and your temperature down.  DIET:  As you were doing prior to hospitalization, we recommend a well-balanced diet.  DRESSING / WOUND CARE / SHOWERING:  Keep the surgical dressing until follow up.  The dressing is water proof, so you can shower without any extra covering.  IF THE DRESSING FALLS OFF or the wound gets wet inside, change the dressing with sterile gauze.  Please use good hand washing techniques before changing the dressing.  Do not use any lotions or creams on the incision until instructed by your surgeon.    ACTIVITY  Increase activity slowly as tolerated, but follow the weight bearing instructions below.   No driving for 6 weeks or until further direction given by your physician.  You cannot drive while taking narcotics.  No lifting or carrying greater than 10 lbs. until further directed by your surgeon. Avoid periods of inactivity such as sitting longer than an hour when not asleep. This helps prevent blood clots.  You may return to work once you are authorized by your doctor.   WEIGHT BEARING: Weight bearing as tolerated with assist device (walker, cane, etc) as  directed, use it as long as suggested by your surgeon or therapist, typically at least 4-6 weeks.  EXERCISES  Results after joint replacement surgery are often greatly improved when you follow the exercise, range of motion and muscle strengthening exercises prescribed by your doctor. Safety measures are also important to protect the joint from further injury. Any time any of these exercises cause you to have increased pain or swelling, decrease what you are doing until you are comfortable again and then slowly increase them. If you have problems or questions, call your caregiver or physical therapist for advice.   Rehabilitation is important following a joint replacement. After just a few days of immobilization, the muscles of the leg can become weakened and shrink (atrophy).  These exercises are designed to build up the tone and strength of the thigh and leg muscles and to improve motion. Often times heat used for twenty to thirty minutes before working out will loosen up your tissues and help with improving the range of motion but do not use heat for the first two weeks following surgery (sometimes heat can increase post-operative swelling).   These exercises can be done on a training (exercise) mat, on the floor, on a table or on a bed. Use whatever works the best and is most comfortable for you.    Use music or television while you are exercising so that the exercises are a pleasant break in your day. This will make your life  better with the exercises acting as a break in your routine that you can look forward to.   Perform all exercises about fifteen times, three times per day or as directed.  You should exercise both the operative leg and the other leg as well.  Exercises include:   Quad Sets - Tighten up the muscle on the front of the thigh (Quad) and hold for 5-10 seconds.   Straight Leg Raises - With your knee straight (if you were given a brace, keep it on), lift the leg to 60 degrees, hold  for 3 seconds, and slowly lower the leg.  Perform this exercise against resistance later as your leg gets stronger.  Leg Slides: Lying on your back, slowly slide your foot toward your buttocks, bending your knee up off the floor (only go as far as is comfortable). Then slowly slide your foot back down until your leg is flat on the floor again.  Angel Wings: Lying on your back spread your legs to the side as far apart as you can without causing discomfort.  Hamstring Strength:  Lying on your back, push your heel against the floor with your leg straight by tightening up the muscles of your buttocks.  Repeat, but this time bend your knee to a comfortable angle, and push your heel against the floor.  You may put a pillow under the heel to make it more comfortable if necessary.   A rehabilitation program following joint replacement surgery can speed recovery and prevent re-injury in the future due to weakened muscles. Contact your doctor or a physical therapist for more information on knee rehabilitation.   CONSTIPATION:  Constipation is defined medically as fewer than three stools per week and severe constipation as less than one stool per week.  Even if you have a regular bowel pattern at home, your normal regimen is likely to be disrupted due to multiple reasons following surgery.  Combination of anesthesia, postoperative narcotics, change in appetite and fluid intake all can affect your bowels.   YOU MUST use at least one of the following options; they are listed in order of increasing strength to get the job done.  They are all available over the counter, and you may need to use some, POSSIBLY even all of these options:    Drink plenty of fluids (prune juice may be helpful) and high fiber foods Colace 100 mg by mouth twice a day  Senokot for constipation as directed and as needed Dulcolax (bisacodyl), take with full glass of water  Miralax (polyethylene glycol) once or twice a day as needed.  If you  have tried all these things and are unable to have a bowel movement in the first 3-4 days after surgery call either your surgeon or your primary doctor.    If you experience loose stools or diarrhea, hold the medications until you stool forms back up.  If your symptoms do not get better within 1 week or if they get worse, check with your doctor.  If you experience "the worst abdominal pain ever" or develop nausea or vomiting, please contact the office immediately for further recommendations for treatment.  ITCHING:  If you experience itching with your medications, try taking only a single pain pill, or even half a pain pill at a time.  You can also use Benadryl over the counter for itching or also to help with sleep.   TED HOSE STOCKINGS:  Use stockings on both legs until for at least 2 weeks or  as directed by physician office. They may be removed at night for sleeping.  MEDICATIONS:  See your medication summary on the "After Visit Summary" that nursing will review with you.  You may have some home medications which will be placed on hold until you complete the course of blood thinner medication.  It is important for you to complete the blood thinner medication as prescribed.  Blood clot prevention (DVT Prophylaxis): After surgery you are at an increased risk for a blood clot. you were prescribed a blood thinner, Aspirin 81mg , to be taken twice daily for a total of 4 weeks from surgery to help reduce your risk of getting a blood clot.  Signs of a pulmonary embolus (blood clot in the lungs) include sudden short of breath, feeling lightheaded or dizzy, chest pain with a deep breath, rapid pulse rapid breathing.  Signs of a blood clot in your arms or legs include new unexplained swelling and cramping, warm, red or darkened skin around the painful area.  Please call the office or 911 right away if these signs or symptoms develop.  PRECAUTIONS:   If you experience chest pain or shortness of breath - call 911  immediately for transfer to the hospital emergency department.   If you develop a fever greater that 101 F, purulent drainage from wound, increased redness or drainage from wound, foul odor from the wound/dressing, or calf pain - CONTACT YOUR SURGEON.                                                   FOLLOW-UP APPOINTMENTS:  If you do not already have a post-op appointment, please call the office for an appointment to be seen by your surgeon.  Guidelines for how soon to be seen are listed in your "After Visit Summary", but are typically between 2-3 weeks after surgery.  If you have a specialized bandage, you may be told to follow up 1 week after surgery.  POST-OPERATIVE OPIOID TAPER INSTRUCTIONS: It is important to wean off of your opioid medication as soon as possible. If you do not need pain medication after your surgery it is ok to stop day one. Opioids include: Codeine, Hydrocodone(Norco, Vicodin), Oxycodone(Percocet, oxycontin) and hydromorphone amongst others.  Long term and even short term use of opiods can cause: Increased pain response Dependence Constipation Depression Respiratory depression And more.  Withdrawal symptoms can include Flu like symptoms Nausea, vomiting And more Techniques to manage these symptoms Hydrate well Eat regular healthy meals Stay active Use relaxation techniques(deep breathing, meditating, yoga) Do Not substitute Alcohol to help with tapering If you have been on opioids for less than two weeks and do not have pain than it is ok to stop all together.  Plan to wean off of opioids This plan should start within one week post op of your joint replacement. Maintain the same interval or time between taking each dose and first decrease the dose.  Cut the total daily intake of opioids by one tablet each day Next start to increase the time between doses. The last dose that should be eliminated is the evening dose.   MAKE SURE YOU:  Understand these  instructions.  Get help right away if you are not doing well or get worse.    Thank you for letting us be a part of your medical care team.  It is a privilege we respect greatly.  We hope these instructions will help you stay on track for a fast and full recovery!

## 2023-06-25 NOTE — Progress Notes (Signed)
PT Cancellation Note  Patient Details Name: Brandi Mccoy MRN: [id number] DOB: [birth year]   Cancelled Treatment:     PT order received but eval deferred - pt with L hip fx and scheduled for surgery this date.  Will follow.   Lorilynn Lehr 06/25/2023, 7:23 AM

## 2023-06-25 NOTE — Anesthesia Preprocedure Evaluation (Addendum)
Anesthesia Evaluation  Patient identified by MRN, date of birth, ID band Patient awake    Reviewed: Allergy & Precautions, H&P , NPO status , Patient's Chart, lab work & pertinent test results  Airway Mallampati: III  TM Distance: >3 FB Neck ROM: Full    Dental  (+) Partial Lower, Dental Advisory Given   Pulmonary neg pulmonary ROS   Pulmonary exam normal breath sounds clear to auscultation       Cardiovascular hypertension (175/70 preop), Pt. on medications pulmonary hypertension (mild pHTN on echo this AM)+CHF (diastolic HF, has been titrating up lasix for LE edema)  Normal cardiovascular exam+ Valvular Problems/Murmurs (mild AS, mild MR, mild MS) AS and MR  Rhythm:Regular Rate:Normal  TTE this AM:   1. Left ventricular ejection fraction, by estimation, is 60 to 65%. The  left ventricle has normal function. The left ventricle has no regional  wall motion abnormalities. Left ventricular diastolic parameters are  consistent with Grade I diastolic  dysfunction (impaired relaxation).   2. Right ventricular systolic function is normal. The right ventricular  size is normal. There is mildly elevated pulmonary artery systolic  pressure.   3. Severe posterior annular calcification extending sub-chordally. The mitral valve is degenerative. Mild mitral valve regurgitation.  Mild mitral stenosis. Severe mitral annular calcification.   4. The aortic valve is calcified. Aortic valve regurgitation is not  visualized. Mild aortic valve stenosis.     Neuro/Psych  PSYCHIATRIC DISORDERS  Depression    negative neurological ROS     GI/Hepatic Neg liver ROS,GERD  Controlled,,  Endo/Other  diabetes, Well Controlled, Type 2    Renal/GU Renal Insufficiency and CRFRenal diseaseKnown CKD 3, Cr 1.34  negative genitourinary   Musculoskeletal  (+) Arthritis , Osteoarthritis,  L femoral neck fx   Abdominal   Peds negative pediatric ROS (+)   Hematology  (+) Blood dyscrasia, anemia Hb 9.4, plt 214   Anesthesia Other Findings   Reproductive/Obstetrics negative OB ROS                             Anesthesia Physical Anesthesia Plan  ASA: 3  Anesthesia Plan: Spinal and MAC   Post-op Pain Management: Tylenol PO (pre-op)*   Induction:   PONV Risk Score and Plan: 2 and Propofol infusion and TIVA  Airway Management Planned: Natural Airway and Nasal Cannula  Additional Equipment: None  Intra-op Plan:   Post-operative Plan:   Informed Consent: I have reviewed the patients History and Physical, chart, labs and discussed the procedure including the risks, benefits and alternatives for the proposed anesthesia with the patient or authorized representative who has indicated his/her understanding and acceptance.   Patient has DNR.  Discussed DNR with patient and Continue DNR.   Consent reviewed with POA  Plan Discussed with: CRNA  Anesthesia Plan Comments:         Anesthesia Quick Evaluation

## 2023-06-25 NOTE — Transfer of Care (Signed)
Immediate Anesthesia Transfer of Care Note  Patient: Brandi Mccoy  Procedure(s) Performed: ARTHROPLASTY BIPOLAR HIP (HEMIARTHROPLASTY) (Left: Hip)  Patient Location: PACU  Anesthesia Type:MAC and Spinal  Level of Consciousness: awake, alert , and oriented  Airway & Oxygen Therapy: Patient Spontanous Breathing and Patient connected to nasal cannula oxygen  Post-op Assessment: Report given to RN and Post -op Vital signs reviewed and stable  Post vital signs: Reviewed and stable  Last Vitals:  Vitals Value Taken Time  BP 149/54 06/25/23 1424  Temp 36.5 C 06/25/23 1424  Pulse 70 06/25/23 1425  Resp 18 06/25/23 1425  SpO2 98 % 06/25/23 1425  Vitals shown include unfiled device data.  Last Pain:  Vitals:   06/25/23 1130  TempSrc:   PainSc: 0-No pain         Complications: No notable events documented.

## 2023-06-25 NOTE — Plan of Care (Signed)
  Problem: Education: Goal: Knowledge of General Education information will improve Description: Including pain rating scale, medication(s)/side effects and non-pharmacologic comfort measures Outcome: Progressing   Problem: Clinical Measurements: Goal: Respiratory complications will improve Outcome: Progressing Goal: Cardiovascular complication will be avoided Outcome: Progressing   

## 2023-06-25 NOTE — Op Note (Signed)
06/24/2023 - 06/25/2023  1:51 PM  PATIENT:  Brandi Mccoy   MRN: [id number]  PRE-OPERATIVE DIAGNOSIS:  Left femoral neck fracture  POST-OPERATIVE DIAGNOSIS:  Left femoral neck fracture  PROCEDURE:  Procedure(s): ARTHROPLASTY BIPOLAR HIP (HEMIARTHROPLASTY)  PREOPERATIVE INDICATIONS:  Brandi Mccoy is an 89 y.o. female who was admitted 06/24/2023 with a diagnosis of Fracture of femoral neck, left, closed (HCC) and elected for surgical management.  The risks benefits and alternatives were discussed with the patient including but not limited to the risks of nonoperative treatment, versus surgical intervention including infection, bleeding, nerve injury, periprosthetic fracture, the need for revision surgery, dislocation, leg length discrepancy, blood clots, cardiopulmonary complications, morbidity, mortality, among others, and they were willing to proceed.  Predicted outcome is good, although there will be at least a six to nine month expected recovery.   OPERATIVE REPORT     SURGEON:  Weber Cooks, MD    ASSISTANT: Kathie Dike, PA-C (Present throughout the entire procedure,  necessary for completion of procedure in a timely manner, assisting with retraction, instrumentation, and closure)     ANESTHESIA: Spinal  ESTIMATED BLOOD LOSS: 150 cc    COMPLICATIONS:  None.     COMPONENTS:  [employer] cemented Accolade C stem with 127 degree neck angle, 28 x 45 bipolar head ball, 28+0 ceramic femoral head Implant Name Type Inv. Item Serial No. Manufacturer Lot No. LRB No. Used Action  CEMENT RESTRICTOR BONE PREP ST - [id number] Cement CEMENT RESTRICTOR BONE PREP ST  [employer] [id number] Left 1 Implanted  STEM HIP ACCOLADE SZ4 35X137 - [id number] Stem STEM HIP ACCOLADE SZ4 35X137  [employer] [id number] Left 1 Implanted  HEAD COMP BIPOLAR 28X45 - [id number] Head HEAD COMP BIPOLAR 28X45  [employer] [id number] Left 1 Implanted  HEAD CERAMIC V40 BIOLOX DEL 28 - [id number]  Orthopedic Implant HEAD CERAMIC V40 BIOLOX DEL 28  [employer] [id number] Left 1 Implanted       PROCEDURE IN DETAIL: The patient was met in the holding area and identified.  The appropriate hip  was marked at the operative site. The patient was then transported to the OR and  placed under anesthesia.  At that point, the patient was  placed in the lateral decubitus position with the operative side up and  secured to the operating room table and all bony prominences padded. A subaxillary role was placed.    The operative lower extremity was prepped from the iliac crest to the ankle.  Sterile draping was performed.  2g of ancef and 1g TXA were given prior to incision. Time out was performed prior to incision.      A routine posterolateral approach was utilized via sharp dissection  carried down to the subcutaneous tissue.  Gross bleeders were Bovie  coagulated.  The iliotibial band was identified and incised  along the length of the skin incision.  A Charnley retractor was inserted with care to protect the sciatic nerve.  With the hip internally rotated, the short external rotators  were identified. The piriformis was tagged with #5 Ethibond, and the hip capsule released in a T-type fashion, and posterior sleeve of the capsule was also tagged.  The femoral neck was exposed, and I resected the femoral neck using the appropriate jig. This was performed at approximately a thumb's breadth above the lesser trochanter.    I then exposed the deep acetabulum, cleared out any tissue including the ligamentum teres.    I then prepared the proximal femur using the  box cutter, Charnley awl, and then sequentially broached.  A trial utilized, and I reduced the hip, leg lengths were assessed clinically and felt to be equal. The hip was then taken through a full range of motion, the hip was stable at full extension and 90 degrees external rotation without anterior subluxation. The hip was also stable in the  position of sleep, and in neutral abduction up to 90 degrees flexion, and 90  degrees IR. The trial components were then removed.   We then prepared canal for cementation.  The cement restrictor was measured and inserted distally.  The canal was then irrigated with the pulse lavage and 3 L of normal saline.  2 bags of Simplex cement were prepared.  Using the cement gun the cement was inserted distally and the canal was filled.  We then pressurized the canal. The real implant was then inserted matching the patient's native anteversion of approximately 30 degrees.  We then waited for 13 minutes for the cement to be fully set.  Excess cement was removed.  A lap was placed in the acetabulum prior to cementing was also removed and the acetabulum was assessed to make sure there was no cement or bone fragments.  The hip was then reduced with the trial head again and taken through functional range of motion and found to have excellent stability. Leg lengths were restored. The real head was then impacted onto the stem and the hip was again reduced.  The capsule was then repaired with #5 Ethibond., and the piriformis was repaired to the abductor tendon. Excellent posterior capsular repair was achieved.   I then irrigated the hip copiously again with pulse lavage. The wounds were injected with 20cc exparal diluted in sterile saline. The fascia and IT band was repaired with #1 stratafix, followed by 0 stratafix for the fat layer followed by 2-0 Vicryl and running 3-0 Monocryl for the skin, Dermabond was applied and an Aquacel dressing was placed.  The patient was then awakened and returned to PACU in stable and satisfactory condition. There were no complications.  Post op recs: WB: WBAT with no precautions Abx: ancef x23 hours post op Imaging: PACU xrays Dressing: Aquacel dressing to be kept intact until follow-up DVT prophylaxis: aspirin 81mg  BID starting POD1 x4 weeks Follow up: 2 weeks after surgery for a  wound check with Dr. Blanchie Dessert at [facility].  Address: [address], [place], [place] [address]  Office Phone: [phone]   Weber Cooks, MD Orthopedic Surgeon  06/25/2023 1:51 PM

## 2023-06-26 ENCOUNTER — Encounter (HOSPITAL_COMMUNITY): Payer: Self-pay | Admitting: Orthopedic Surgery

## 2023-06-26 DIAGNOSIS — R636 Underweight: Secondary | ICD-10-CM | POA: Diagnosis not present

## 2023-06-26 DIAGNOSIS — Z01818 Encounter for other preprocedural examination: Secondary | ICD-10-CM

## 2023-06-26 DIAGNOSIS — N1832 Chronic kidney disease, stage 3b: Secondary | ICD-10-CM

## 2023-06-26 DIAGNOSIS — Z681 Body mass index (BMI) 19 or less, adult: Secondary | ICD-10-CM

## 2023-06-26 DIAGNOSIS — S72002A Fracture of unspecified part of neck of left femur, initial encounter for closed fracture: Secondary | ICD-10-CM | POA: Diagnosis not present

## 2023-06-26 DIAGNOSIS — Z7189 Other specified counseling: Secondary | ICD-10-CM | POA: Diagnosis not present

## 2023-06-26 LAB — CBC
HCT: 28.1 % — ABNORMAL LOW (ref 36.0–46.0)
Hemoglobin: 8.4 g/dL — ABNORMAL LOW (ref 12.0–15.0)
MCH: 28.6 pg (ref 26.0–34.0)
MCHC: 29.9 g/dL — ABNORMAL LOW (ref 30.0–36.0)
MCV: 95.6 fL (ref 80.0–100.0)
Platelets: 188 10*3/uL (ref 150–400)
RBC: 2.94 MIL/uL — ABNORMAL LOW (ref 3.87–5.11)
RDW: 13.8 % (ref 11.5–15.5)
WBC: 7.2 10*3/uL (ref 4.0–10.5)
nRBC: 0 % (ref 0.0–0.2)

## 2023-06-26 LAB — RENAL FUNCTION PANEL
Albumin: 2.7 g/dL — ABNORMAL LOW (ref 3.5–5.0)
Anion gap: 8 (ref 5–15)
BUN: 25 mg/dL — ABNORMAL HIGH (ref 8–23)
CO2: 22 mmol/L (ref 22–32)
Calcium: 8.4 mg/dL — ABNORMAL LOW (ref 8.9–10.3)
Chloride: 105 mmol/L (ref 98–111)
Creatinine, Ser: 1.29 mg/dL — ABNORMAL HIGH (ref 0.44–1.00)
GFR, Estimated: 37 mL/min — ABNORMAL LOW (ref >60)
Glucose, Bld: 83 mg/dL (ref 70–99)
Phosphorus: 3.5 mg/dL (ref 2.5–4.6)
Potassium: 4.3 mmol/L (ref 3.5–5.1)
Sodium: 135 mmol/L (ref 135–145)

## 2023-06-26 LAB — BASIC METABOLIC PANEL
Anion gap: 9 (ref 5–15)
BUN: 25 mg/dL — ABNORMAL HIGH (ref 8–23)
CO2: 21 mmol/L — ABNORMAL LOW (ref 22–32)
Calcium: 8.4 mg/dL — ABNORMAL LOW (ref 8.9–10.3)
Chloride: 105 mmol/L (ref 98–111)
Creatinine, Ser: 1.31 mg/dL — ABNORMAL HIGH (ref 0.44–1.00)
GFR, Estimated: 37 mL/min — ABNORMAL LOW (ref >60)
Glucose, Bld: 81 mg/dL (ref 70–99)
Potassium: 4.3 mmol/L (ref 3.5–5.1)
Sodium: 135 mmol/L (ref 135–145)

## 2023-06-26 LAB — MAGNESIUM: Magnesium: 2.2 mg/dL (ref 1.7–2.4)

## 2023-06-26 MED ORDER — ASPIRIN 81 MG PO TBEC: 81.0000 mg | DELAYED_RELEASE_TABLET | Freq: Two times a day (BID) | ORAL | 0 refills | Status: AC

## 2023-06-26 MED ORDER — TRAZODONE HCL 50 MG PO TABS
25.0000 mg | ORAL_TABLET | Freq: Once | ORAL | Status: AC
  Administered 2023-06-26: 25 mg via ORAL
  Filled 2023-06-26: qty 1

## 2023-06-26 MED ORDER — ACETAMINOPHEN 325 MG PO TABS
650.0000 mg | ORAL_TABLET | Freq: Four times a day (QID) | ORAL | Status: DC | PRN
  Administered 2023-06-28 – 2023-06-29 (×3): 650 mg via ORAL
  Filled 2023-06-26 (×4): qty 2

## 2023-06-26 MED ORDER — OXYCODONE HCL 5 MG PO TABS: 2.5000 mg | ORAL_TABLET | ORAL | 0 refills | Status: AC | PRN

## 2023-06-26 MED ORDER — ENSURE ENLIVE PO LIQD
237.0000 mL | Freq: Two times a day (BID) | ORAL | Status: DC
  Administered 2023-06-26 – 2023-06-29 (×4): 237 mL via ORAL

## 2023-06-26 MED ORDER — TRAZODONE HCL 50 MG PO TABS: 25.0000 mg | ORAL_TABLET | Freq: Once | ORAL | Status: DC

## 2023-06-26 NOTE — Progress Notes (Signed)
PROGRESS NOTE  Brandi Mccoy:[id number] DOB: [birth year]   PCP: Linus Galas, NP  Patient is from: Home.  Lives alone but children alternate.  Ambulates with rolling walker.  DOA: 06/24/2023 LOS: 1  Chief complaints Chief Complaint  Patient presents with   Fall   Hip Pain     Brief Narrative / Interim history: 89 year old F with PMH of DM-2, CKD-3B, HTN, HLD, GERD, depression, LBBB, and first-degree AVB presented to ED with left hip pain after accidental ground-level fall, and admitted with left femoral neck fracture.  Patient was walking around the house when her left leg lost control and she fell on the left side.  Denies hitting her head or LOC.  No prodromes.  Left hip x-ray with impacted transcervical left femoral neck fracture.  CT of left hip showed acute transcervical fracture of proximal left femur with mild impaction, age-indeterminate nondisplaced fracture through the proximal segment of the coccyx and bilateral inguinal hernias.  Left knee x-ray without acute finding.  EKG sinus rhythm with first-degree AVB and LBBB unchanged from prior.  Orthopedic surgery consulted.  Echocardiogram ordered.  Echocardiogram without significant finding.  Patient underwent left hip hemiarthroplasty by Dr. Blanchie Dessert on 12/29.  No postop complication.  Working with therapy.  Subjective: Seen and examined earlier this morning.  Out of bed sitting on chair.  No major events overnight of this morning.  She was in some pain earlier this morning that has resolved with pain medication.  No complaints.  She denies chest pain, dyspnea, GI or UTI symptoms.  Patient's daughter at bedside.  Objective: Vitals:   06/25/23 2055 06/26/23 0108 06/26/23 0559 06/26/23 1036  BP: (!) 160/58 (!) 141/52 (!) 127/56 (!) 132/53  Pulse: 63 67 73 61  Resp: 18 18 18 17   Temp: 98 F (36.7 C) 98.1 F (36.7 C) 98.2 F (36.8 C) 97.7 F (36.5 C)  TempSrc: Oral Oral Oral   SpO2: 95% 97% 96% 95%  Weight:       Height:        Examination:  GENERAL: No apparent distress.  Nontoxic. Appears frail HEENT: MMM.  Vision and hearing grossly intact.  NECK: Supple.  No apparent JVD.  RESP:  No IWOB.  Fair aeration bilaterally. CVS:  RRR. Heart sounds normal.  ABD/GI/GU: BS+. Abd soft, NTND.  MSK/EXT: No apparent deformity.  Significant muscle mass and subcu fat loss.   NEURO: Awake, alert and oriented appropriately.  No apparent focal neuro deficit. PSYCH: Calm. Normal affect.   Procedures:  12/29-left hip hemiarthroplasty by Dr. Blanchie Dessert  Microbiology summarized: MRSA PCR screen nonreactive  Assessment and plan: Mechanical fall at home-ground-level fall.  No prodromes.  Did not hit head or lose consciousness. Acute left femoral neck fracture due to fall -Patient independently ambulates using rolling walker at baseline. -S/p left hip hemiarthroplasty by Dr. Blanchie Dessert on 12/29 -Pain control and VTE prophylaxis per surgery -PT/OT  CKD-3B: Stable Recent Labs    08/01/22 1625 08/12/22 1442 06/24/23 1558 06/25/23 0321 06/26/23 0310 06/26/23 0311  BUN 28 29 25* 28* 25* 25*  CREATININE 1.94* 1.49* 1.28* 1.34* 1.31* 1.29*  -Continue monitoring   Essential hypertension: BP slightly elevated but improved. -Continue amlodipine, hydralazine -Pain control as above  Hyperlipidemia -Continue statin-but doubt utility.   Chronic anemia-slight drop in Hgb likely dilutional and some surgical blood loss.  EBL 150 cc.Marland Kitchen Recent Labs    06/24/23 1620 06/25/23 0321 06/26/23 0310  HGB 10.7* 9.4* 8.4*  -Continue monitoring -Check anemia panel  Bilateral inguinal hernia: Noted on CT.  No incarceration.  Advance care planning: DNR/DNI.  See discussion on 12/29.     Underweight Body mass index is 17.92 kg/m. -Consult dietitian          DVT prophylaxis:  SCD pending surgery Aspirin 81 mg twice daily  Code Status: DNR/DNI Family Communication: Updated patient's daughter at  bedside Level of care: Med-Surg Status is: Inpatient The patient will remain inpatient because: Acute left femoral neck fracture   Final disposition: Likely SNF. Consultants:  Orthopedic surgery  35 minutes with more than 50% spent in reviewing records, counseling patient/family and coordinating care.   Sch Meds:  Scheduled Meds:  amLODipine  5 mg Oral Daily   aspirin EC  81 mg Oral BID   feeding supplement  237 mL Oral BID BM   hydrALAZINE  50 mg Oral TID   pravastatin  10 mg Oral Daily   Continuous Infusions:   PRN Meds:.acetaminophen, fentaNYL (SUBLIMAZE) injection, HYDROcodone-acetaminophen, melatonin, ondansetron **OR** ondansetron (ZOFRAN) IV, polyethylene glycol  Antimicrobials: Anti-infectives (From admission, onward)    Start     Dose/Rate Route Frequency Ordered Stop   06/25/23 2030  ceFAZolin (ANCEF) IVPB 2g/100 mL premix        2 g 200 mL/hr over 30 Minutes Intravenous Every 8 hours 06/25/23 1607 06/26/23 0422   06/25/23 1040  ceFAZolin (ANCEF) 2-4 GM/100ML-% IVPB  Status:  Discontinued       Note to Pharmacy: Lacy Duverney : cabinet override      06/25/23 1040 06/25/23 1105   06/25/23 0845  ceFAZolin (ANCEF) IVPB 2g/100 mL premix        2 g 200 mL/hr over 30 Minutes Intravenous On call to O.R. 06/25/23 0746 06/25/23 1303   06/25/23 0747  ceFAZolin (ANCEF) 2-4 GM/100ML-% IVPB       Note to Pharmacy: Myrlene Broker M: cabinet override      06/25/23 0747 06/25/23 1237        I have personally reviewed the following labs and images: CBC: Recent Labs  Lab 06/24/23 1620 06/25/23 0321 06/26/23 0310  WBC 7.9 6.7 7.2  NEUTROABS 6.0  --   --   HGB 10.7* 9.4* 8.4*  HCT 34.6* 30.3* 28.1*  MCV 93.3 94.1 95.6  PLT 228 214 188   BMP &GFR Recent Labs  Lab 06/24/23 1558 06/25/23 0321 06/26/23 0310 06/26/23 0311  NA 139 138 135 135  K 4.0 3.9 4.3 4.3  CL 107 105 105 105  CO2 21* 22 21* 22  GLUCOSE 120* 101* 81 83  BUN 25* 28* 25* 25*  CREATININE  1.28* 1.34* 1.31* 1.29*  CALCIUM 9.3 8.9 8.4* 8.4*  MG  --   --  2.2  --   PHOS  --   --   --  3.5   Estimated Creatinine Clearance: 16.7 mL/min (A) (by C-G formula based on SCr of 1.29 mg/dL (H)). Liver & Pancreas: Recent Labs  Lab 06/26/23 0311  ALBUMIN 2.7*   No results for input(s): "LIPASE", "AMYLASE" in the last 168 hours. No results for input(s): "AMMONIA" in the last 168 hours. Diabetic: No results for input(s): "HGBA1C" in the last 72 hours. Recent Labs  Lab 06/25/23 1137 06/25/23 1429  GLUCAP 68* 66*   Cardiac Enzymes: No results for input(s): "CKTOTAL", "CKMB", "CKMBINDEX", "TROPONINI" in the last 168 hours. No results for input(s): "PROBNP" in the last 8760 hours. Coagulation Profile: Recent Labs  Lab 06/25/23 0321  INR 1.1   Thyroid  Function Tests: No results for input(s): "TSH", "T4TOTAL", "FREET4", "T3FREE", "THYROIDAB" in the last 72 hours. Lipid Profile: No results for input(s): "CHOL", "HDL", "LDLCALC", "TRIG", "CHOLHDL", "LDLDIRECT" in the last 72 hours. Anemia Panel: No results for input(s): "VITAMINB12", "FOLATE", "FERRITIN", "TIBC", "IRON", "RETICCTPCT" in the last 72 hours. Urine analysis: No results found for: "COLORURINE", "APPEARANCEUR", "LABSPEC", "PHURINE", "GLUCOSEU", "HGBUR", "BILIRUBINUR", "KETONESUR", "PROTEINUR", "UROBILINOGEN", "NITRITE", "LEUKOCYTESUR" Sepsis Labs: Invalid input(s): "PROCALCITONIN", "LACTICIDVEN"  Microbiology: Recent Results (from the past 240 hours)  Surgical PCR screen     Status: None   Collection Time: 06/25/23  1:59 AM   Specimen: Nasal Mucosa; Nasal Swab  Result Value Ref Range Status   MRSA, PCR NEGATIVE NEGATIVE Final   Staphylococcus aureus NEGATIVE NEGATIVE Final    Comment: (NOTE) The Xpert SA Assay (FDA approved for NASAL specimens in patients 60 years of age and older), is one component of a comprehensive surveillance program. It is not intended to diagnose infection nor to guide or monitor  treatment. Performed at [facility], [address], [place], [place] [address]     Radiology Studies: DG HIP UNILAT W OR W/O PELVIS 2-3 VIEWS LEFT Result Date: 06/25/2023 CLINICAL DATA:  [id number] Post-operative state 252351 EXAM: DG HIP (WITH OR WITHOUT PELVIS) 2-3V LEFT COMPARISON:  06/24/2023 FINDINGS: Interval postsurgical changes from left total hip arthroplasty. Arthroplasty components are in their expected alignment. No periprosthetic fracture or evidence of other complication. Expected postoperative changes within the overlying soft tissues. IMPRESSION: Interval left total hip arthroplasty without evidence of postoperative complication. Electronically Signed   By: Duanne Guess D.O.   On: 06/25/2023 16:17      Janah Mcculloh T. Manpreet Strey [facility]  If 7PM-7AM, please contact night-coverage [email or web address] 06/26/2023, 1:59 PM

## 2023-06-26 NOTE — Progress Notes (Signed)
Initial Nutrition Assessment  DOCUMENTATION CODES:   Severe malnutrition in context of chronic illness, Underweight  INTERVENTION:  - Carb Modified diet per MD. - Add Ensure Plus High Protein po BID, each supplement provides 350 kcal and 20 grams of protein.  - Please mix with vanilla ice cream. - Add Magic cup BID with meals, each supplement provides 290 kcal and 9 grams of protein - Encourage intake at all meals and of supplements.  - Add Multivitamin with minerals daily - Monitor weight trends.   NUTRITION DIAGNOSIS:   Severe Malnutrition related to chronic illness as evidenced by severe fat depletion, severe muscle depletion, energy intake < or equal to 75% for > or equal to 1 month.  GOAL:   Patient will meet greater than or equal to 90% of their needs  MONITOR:   PO intake, Supplement acceptance, Weight trends  REASON FOR ASSESSMENT:   Consult Assessment of nutrition requirement/status  ASSESSMENT:   89 year old F with PMH of DM-2, CKD-3B, HTN, HLD, GERD, depression who presented with left hip pain after accidental ground-level fall, and admitted with left femoral neck fracture.  Patient working with RN's at time of visit. Daughter at bedside, provided all nutrition history.    UBW reported to be 98# however daughter states patient was weighing 108# ~1 year ago and has lost over the past year to 98#.   Shares the patient doesn't have a great appetite and eats only a little at home. Typically has something all 3 meals but daughter notes they are small and mostly soft foods such as mashed potatoes, cereal in milk, grits, soft vegetables mashed up, and manicotti. Daughter has tried to get the patient to drink supplements at home. They only one she didn't seem to hate was vanilla Boost. Does not like chocolate flavored drinks. She had also been making her milkshakes to drink for extra calories.    States that patient has been eating fairly since admission 2 days ago.  Daughter is ordering meals with soft foods for her and although she is eating at all 3 meals she is only taking bites of things.   Discussed that we do not have vanilla Boost but could try vanilla Ensure mixed with ice cream and daughter agreeable. Also agreeable to have patient try vanilla Magic Cup with meals.    Medications reviewed and include: -  Labs reviewed:  Creatinine 1.29 No HA1C on file   NUTRITION - FOCUSED PHYSICAL EXAM:  Flowsheet Row Most Recent Value  Orbital Region Severe depletion  Upper Arm Region Severe depletion  Thoracic and Lumbar Region Severe depletion  Buccal Region Severe depletion  Temple Region Severe depletion  Clavicle Bone Region Severe depletion  Clavicle and Acromion Bone Region Severe depletion  Scapular Bone Region Unable to assess  Dorsal Hand Severe depletion  Patellar Region Mild depletion  Anterior Thigh Region Mild depletion  Posterior Calf Region Moderate depletion  Edema (RD Assessment) None  Hair Reviewed  Eyes Reviewed  Mouth Reviewed  Skin Reviewed  Nails Reviewed       Diet Order:   Diet Order             Diet Carb Modified Fluid consistency: Thin; Room service appropriate? Yes  Diet effective now                   EDUCATION NEEDS:  Education needs have been addressed  Skin:  Skin Assessment: Reviewed RN Assessment  Last BM:  12/26  Height:  Ht  Readings from Last 1 Encounters:  06/24/23 5\' 2"  (1.575 m)   Weight:  Wt Readings from Last 1 Encounters:  06/24/23 44.5 kg    BMI:  Body mass index is 17.92 kg/m.  Estimated Nutritional Needs:  Kcal:  1350-1550 kcals Protein:  65-80 grams Fluid:  >/= 1.4L    Shelle Iron RD, LDN Contact via Secure Chat.

## 2023-06-26 NOTE — Progress Notes (Signed)
     1 Day Post-Op Procedure(s) (LRB): ARTHROPLASTY BIPOLAR HIP (HEMIARTHROPLASTY) (Left) Subjective:  Patient reports pain as mild, some soreness with hip motion but overall feels controlled.  Slept okay.  Advancing diet.  No reports of chest pain, ShOB, N/V.  Denies numbness or tingling.  FM at bedside.  No other complaints at this time.  Objective:   VITALS:   Vitals:   06/25/23 1832 06/25/23 2055 06/26/23 0108 06/26/23 0559  BP: (!) 143/52 (!) 160/58 (!) 141/52 (!) 127/56  Pulse: (!) 57 63 67 73  Resp: 14 18 18 18   Temp: 97.8 F (36.6 C) 98 F (36.7 C) 98.1 F (36.7 C) 98.2 F (36.8 C)  TempSrc:  Oral Oral Oral  SpO2: 95% 95% 97% 96%  Weight:      Height:        AAOx4, resting comfortably, in NAD Neurovascular intact Sensation intact distally Intact pulses distally Dorsiflexion/Plantar flexion intact Incision: dressing C/D/I Compartment soft Wiggles toes appropriately   Lab Results  Component Value Date   WBC 7.2 06/26/2023   HGB 8.4 (L) 06/26/2023   HCT 28.1 (L) 06/26/2023   MCV 95.6 06/26/2023   PLT 188 06/26/2023   BMET    Component Value Date/Time   NA 135 06/26/2023 0311   NA 142 08/12/2022 1442   K 4.3 06/26/2023 0311   CL 105 06/26/2023 0311   CO2 22 06/26/2023 0311   GLUCOSE 83 06/26/2023 0311   BUN 25 (H) 06/26/2023 0311   BUN 29 08/12/2022 1442   CREATININE 1.29 (H) 06/26/2023 0311   CALCIUM 8.4 (L) 06/26/2023 0311   EGFR 35.0 10/06/2022 1038   EGFR 32 (L) 08/12/2022 1442   GFRNONAA 37 (L) 06/26/2023 0311     Xray: Stable post-operative imaging  Assessment/Plan: 1 Day Post-Op   Principal Problem:   Fracture of femoral neck, left, closed (HCC) Active Problems:   Preoperative clearance   Advance care planning   Chronic kidney disease, stage 3b (HCC)   Underweight (BMI < 18.5)   Closed displaced fracture of left femoral neck (HCC)  Advance diet, up with therapy, WBAT.  Hgb 8.4, mild post-operative anemia.  May consider  transfusion and/or holding DVT prophylaxis as needed.  Discussed surgery and post-operative instructions with patient at length.    Post op recs: WB: WBAT with no precautions Abx: ancef x23 hours post op Imaging: PACU xrays Dressing: Aquacel dressing to be kept intact until follow-up DVT prophylaxis: aspirin 81mg  BID starting POD1 x4 weeks Follow up: 2 weeks after surgery for a wound check with Dr. Blanchie Dessert at [facility].  Address: [address], [place], [place] [address]  Office Phone: [phone]   Brandi Mccoy 06/26/2023, 7:19 AM   Contact information:   Weekdays 7am-5pm epic message Dr. Blanchie Dessert, or call office for patient follow up: [phone] After hours and holidays please check [email or web address] for group call information for [facility]

## 2023-06-26 NOTE — Evaluation (Addendum)
Physical Therapy Evaluation Patient Details Name: Brandi Mccoy MRN: [id number] DOB: 1921/09/01 Today's Date: 06/26/2023  History of Present Illness  Patient is a 89 year old female who presented after a fall at home on 12/28 that resulted in L hip pain. Patient was found to have impacted transcervical left femoral neck fracture. Patient underwent posterior L hip  hemiarthroplasty hip on 12/29. PMH: HTN, anemia, bilateral inguinal hernia.  Clinical Impression  Pt admitted with above diagnosis.  Pt pleasant and willing to work with PT, pt and dtr report independent amb at baseline. Pt currently limited by pain and weakness requiring +2 assist for transfers and bed mobility.  Patient will benefit from continued follow up therapy, <3 hours/day at d/c   Pt currently with functional limitations due to the deficits listed below (see PT Problem List). Pt will benefit from acute skilled PT to increase their independence and safety with mobility to allow discharge.           If plan is discharge home, recommend the following: A lot of help with walking and/or transfers;A lot of help with bathing/dressing/bathroom;Assistance with cooking/housework;Assist for transportation;Help with stairs or ramp for entrance   Can travel by private vehicle   No    Equipment Recommendations None recommended by PT  Recommendations for Other Services       Functional Status Assessment Patient has had a recent decline in their functional status and demonstrates the ability to make significant improvements in function in a reasonable and predictable amount of time.     Precautions / Restrictions Precautions Precautions: Fall Restrictions Weight Bearing Restrictions Per Provider Order: No LLE Weight Bearing Per Provider Order: Weight bearing as tolerated  Posterior hemiarthroplasty, no precautions     Mobility  Bed Mobility   Bed Mobility: Sit to Supine       Sit to supine: Max assist, +2 for  physical assistance, +2 for safety/equipment   General bed mobility comments: assist to pivot, lower trunk and elevate bil LEs on to bed. pain limiting ability to self assist    Transfers Overall transfer level: Needs assistance Equipment used: Rolling walker (2 wheels), 2 person hand held assist Transfers: Sit to/from Stand, Bed to chair/wheelchair/BSC Sit to Stand: Min assist, Mod assist, +2 physical assistance, +2 safety/equipment Stand pivot transfers: Max assist, +2 physical assistance, +2 safety/equipment         General transfer comment: STS x2. assist to power up with cues for hand placement. first trial to RW, pt pushing walker too far in front of self and unable to wt shift to LLE to take step depsite cues; 2 person SPT chair to bed with assist to rise and pivot    Ambulation/Gait               General Gait Details: unable d/t pain  Stairs            Wheelchair Mobility     Tilt Bed    Modified Rankin (Stroke Patients Only)       Balance Overall balance assessment: Needs assistance Sitting-balance support: Feet supported, No upper extremity supported Sitting balance-Leahy Scale: Fair     Standing balance support: Bilateral upper extremity supported, Reliant on assistive device for balance Standing balance-Leahy Scale: Zero Standing balance comment: reliant on external assist and UE support                             Pertinent Vitals/Pain Pain  Assessment Pain Assessment: Faces Faces Pain Scale: Hurts whole lot Pain Location: LLE with activity Pain Descriptors / Indicators: Grimacing, Operative site guarding Pain Intervention(s): Limited activity within patient's tolerance, Monitored during session, Premedicated before session, Repositioned, Ice applied    Home Living Family/patient expects to be discharged to:: Unsure Living Arrangements: Children Available Help at Discharge: Family Type of Home: House Home Access: Stairs to  enter Entrance Stairs-Rails: Right Entrance Stairs-Number of Steps: 2   Home Layout: One level Home Equipment: Agricultural consultant (2 wheels)      Prior Function Prior Level of Function : Independent/Modified Independent             Mobility Comments: amb without device ADLs Comments: patient does her own medications,     Extremity/Trunk Assessment   Upper Extremity Assessment Upper Extremity Assessment: Generalized weakness    Lower Extremity Assessment Lower Extremity Assessment: Generalized weakness;LLE deficits/detail LLE: Unable to fully assess due to pain    Cervical / Trunk Assessment Cervical / Trunk Assessment: Kyphotic  Communication   Communication Communication: Hearing impairment  Cognition Arousal: Alert Behavior During Therapy: WFL for tasks assessed/performed                                   General Comments: appears WFL, dtr answers some questions d/t pt HOH        General Comments      Exercises     Assessment/Plan    PT Assessment Patient needs continued PT services  PT Problem List Decreased strength;Decreased activity tolerance;Decreased balance;Decreased mobility;Decreased knowledge of use of DME;Pain       PT Treatment Interventions DME instruction;Gait training;Functional mobility training;Therapeutic activities;Therapeutic exercise;Patient/family education    PT Goals (Current goals can be found in the Care Plan section)  Acute Rehab PT Goals Patient Stated Goal: walk PT Goal Formulation: With patient Time For Goal Achievement: 07/10/23 Potential to Achieve Goals: Good    Frequency Min 1X/week     Co-evaluation               AM-PAC PT "6 Clicks" Mobility  Outcome Measure Help needed turning from your back to your side while in a flat bed without using bedrails?: A Lot Help needed moving from lying on your back to sitting on the side of a flat bed without using bedrails?: A Lot Help needed moving to  and from a bed to a chair (including a wheelchair)?: Total Help needed standing up from a chair using your arms (e.g., wheelchair or bedside chair)?: Total Help needed to walk in hospital room?: Total Help needed climbing 3-5 steps with a railing? : Total 6 Click Score: 8    End of Session Equipment Utilized During Treatment: Gait belt Activity Tolerance: Patient tolerated treatment well Patient left: with call bell/phone within reach;in bed;with family/visitor present;with bed alarm set   PT Visit Diagnosis: Unsteadiness on feet (R26.81);Difficulty in walking, not elsewhere classified (R26.2);History of falling (Z91.81)    Time: 1610-9604 PT Time Calculation (min) (ACUTE ONLY): 14 min   Charges:   PT Evaluation $PT Eval Low Complexity: 1 Low   PT General Charges $$ ACUTE PT VISIT: 1 Visit         Zamaria Brazzle, PT  Acute Rehab Dept [facility]) [phone]  06/26/2023   [facility] 06/26/2023, 11:43 AM

## 2023-06-26 NOTE — Evaluation (Signed)
Occupational Therapy Evaluation Patient Details Name: Brandi Mccoy MRN: [id number] DOB: 1922/02/24 Today's Date: 06/26/2023   History of Present Illness Patient is a 89 year old female who presented after a fall at home on 12/28 that resulted in L hip pain. Patient was found to have impacted transcervical left femoral neck fracture. Patient underwent posterior total hip on 12/29. PMH: HTN, anemia, bilateral inguinal hernia,   Clinical Impression   Patient is a 89 year old female who was admitted for above. Patient was living at home alone with family support for IADLs. Currently, patient is +2 to transfer to recliner in room with increased time and patient having increased difficulty weight shifting feet. Patient was noted to have decreased functional activity tolerance, decreased endurance, decreased standing balance, decreased safety awareness, and decreased knowledge of AD/AE impacting participation in ADLs. Daughter reported that she would not have 24/7 care that could physically assist patient with transfers or ADLs at this time. Patient will benefit from continued inpatient follow up therapy, <3 hours/day.       If plan is discharge home, recommend the following: Two people to help with walking and/or transfers;A lot of help with bathing/dressing/bathroom;Assistance with cooking/housework;Direct supervision/assist for medications management;Assist for transportation;Help with stairs or ramp for entrance;Direct supervision/assist for financial management    Functional Status Assessment  Patient has had a recent decline in their functional status and demonstrates the ability to make significant improvements in function in a reasonable and predictable amount of time.  Equipment Recommendations  None recommended by OT       Precautions / Restrictions Precautions Precautions: Fall Restrictions Weight Bearing Restrictions Per Provider Order: Yes LLE Weight Bearing Per Provider Order:  Weight bearing as tolerated      Mobility Bed Mobility Overal bed mobility: Needs Assistance Bed Mobility: Supine to Sit     Supine to sit: Max assist, HOB elevated, Used rails                Balance Overall balance assessment: Needs assistance Sitting-balance support: Single extremity supported, Feet supported Sitting balance-Leahy Scale: Fair     Standing balance support: Reliant on assistive device for balance, Bilateral upper extremity supported Standing balance-Leahy Scale: Zero         ADL either performed or assessed with clinical judgement   ADL Overall ADL's : Needs assistance/impaired Eating/Feeding: Set up;Sitting   Grooming: Sitting;Set up   Upper Body Bathing: Sitting;Set up   Lower Body Bathing: Sitting/lateral leans;Total assistance   Upper Body Dressing : Set up;Sitting   Lower Body Dressing: Total assistance;Sitting/lateral leans;Sit to/from stand Lower Body Dressing Details (indicate cue type and reason): mesh underwear Toilet Transfer: Maximal assistance;+2 for safety/equipment;+2 for physical assistance;Rolling walker (2 wheels);Stand-pivot Statistician Details (indicate cue type and reason): with patient having increasingly difficult time with weight shifting BLE to advance to chair. Toileting- Clothing Manipulation and Hygiene: Total assistance;Sitting/lateral lean               Vision   Vision Assessment?: No apparent visual deficits            Pertinent Vitals/Pain Pain Assessment Pain Assessment: Faces Faces Pain Scale: Hurts whole lot Pain Location: LLE Pain Descriptors / Indicators: Grimacing, Operative site guarding Pain Intervention(s): Limited activity within patient's tolerance, Monitored during session, Ice applied, Premedicated before session     Extremity/Trunk Assessment Upper Extremity Assessment Upper Extremity Assessment: Generalized weakness   Lower Extremity Assessment Lower Extremity Assessment:  Defer to PT evaluation   Cervical /  Trunk Assessment Cervical / Trunk Assessment: Kyphotic   Communication Communication Communication: No apparent difficulties   Cognition Arousal: Alert Behavior During Therapy: Flat affect Overall Cognitive Status: Difficult to assess       General Comments: patient is HOH daughter present in room answering most questions for patient.                Home Living Family/patient expects to be discharged to:: Private residence Living Arrangements: Children Available Help at Discharge: Family Type of Home: House Home Access: Stairs to enter Secretary/administrator of Steps: 2 Entrance Stairs-Rails: Right Home Layout: One level     Bathroom Shower/Tub: Sponge bathes at baseline         Home Equipment: Agricultural consultant (2 wheels)          Prior Functioning/Environment Prior Level of Function : Independent/Modified Independent               ADLs Comments: patient does her own medications,        OT Problem List: Decreased activity tolerance;Impaired balance (sitting and/or standing);Decreased coordination;Decreased safety awareness;Decreased knowledge of precautions;Impaired sensation;Cardiopulmonary status limiting activity;Pain      OT Treatment/Interventions: Self-care/ADL training;Therapeutic exercise;Neuromuscular education;DME and/or AE instruction;Therapeutic activities;Patient/family education    OT Goals(Current goals can be found in the care plan section) Acute Rehab OT Goals Patient Stated Goal: to go home OT Goal Formulation: With patient/family Time For Goal Achievement: 07/10/23 Potential to Achieve Goals: Fair  OT Frequency: Min 1X/week       AM-PAC OT "6 Clicks" Daily Activity     Outcome Measure Help from another person eating meals?: A Little Help from another person taking care of personal grooming?: A Little Help from another person toileting, which includes using toliet, bedpan, or urinal?: A  Lot Help from another person bathing (including washing, rinsing, drying)?: A Lot Help from another person to put on and taking off regular upper body clothing?: A Little Help from another person to put on and taking off regular lower body clothing?: A Lot 6 Click Score: 15   End of Session Equipment Utilized During Treatment: Gait belt;Rolling walker (2 wheels) Nurse Communication: Other (comment) (transfer status)  Activity Tolerance: Patient limited by pain Patient left: in chair;with call bell/phone within reach;with chair alarm set;with family/visitor present  OT Visit Diagnosis: Unsteadiness on feet (R26.81);Other abnormalities of gait and mobility (R26.89);Pain Pain - Right/Left: Left Pain - part of body: Leg                Time: 4098-1191 OT Time Calculation (min): 31 min Charges:  OT General Charges $OT Visit: 1 Visit OT Evaluation $OT Eval Low Complexity: 1 Low OT Treatments $Self Care/Home Management : 8-22 mins  Rosalio Loud, MS Acute Rehabilitation Department Office# [phone]   Selinda Flavin 06/26/2023, 10:06 AM

## 2023-06-27 DIAGNOSIS — R636 Underweight: Secondary | ICD-10-CM | POA: Diagnosis not present

## 2023-06-27 DIAGNOSIS — N1832 Chronic kidney disease, stage 3b: Secondary | ICD-10-CM | POA: Diagnosis not present

## 2023-06-27 DIAGNOSIS — S72002A Fracture of unspecified part of neck of left femur, initial encounter for closed fracture: Secondary | ICD-10-CM | POA: Diagnosis not present

## 2023-06-27 DIAGNOSIS — E43 Unspecified severe protein-calorie malnutrition: Secondary | ICD-10-CM | POA: Insufficient documentation

## 2023-06-27 DIAGNOSIS — Z7189 Other specified counseling: Secondary | ICD-10-CM | POA: Diagnosis not present

## 2023-06-27 LAB — RETICULOCYTES
Immature Retic Fract: 8.8 % (ref 2.3–15.9)
RBC.: 2.91 MIL/uL — ABNORMAL LOW (ref 3.87–5.11)
Retic Count, Absolute: 34 10*3/uL (ref 19.0–186.0)
Retic Ct Pct: 1.2 % (ref 0.4–3.1)

## 2023-06-27 LAB — CBC
HCT: 27.2 % — ABNORMAL LOW (ref 36.0–46.0)
Hemoglobin: 8.5 g/dL — ABNORMAL LOW (ref 12.0–15.0)
MCH: 29.4 pg (ref 26.0–34.0)
MCHC: 31.3 g/dL (ref 30.0–36.0)
MCV: 94.1 fL (ref 80.0–100.0)
Platelets: 192 10*3/uL (ref 150–400)
RBC: 2.89 MIL/uL — ABNORMAL LOW (ref 3.87–5.11)
RDW: 13.9 % (ref 11.5–15.5)
WBC: 8 10*3/uL (ref 4.0–10.5)
nRBC: 0 % (ref 0.0–0.2)

## 2023-06-27 LAB — IRON AND TIBC
Iron: 10 ug/dL — ABNORMAL LOW (ref 28–170)
Saturation Ratios: 7 % — ABNORMAL LOW (ref 10.4–31.8)
TIBC: 151 ug/dL — ABNORMAL LOW (ref 250–450)
UIBC: 141 ug/dL

## 2023-06-27 LAB — FOLATE: Folate: 7.7 ng/mL (ref >5.9)

## 2023-06-27 LAB — FERRITIN: Ferritin: 202 ng/mL (ref 11–307)

## 2023-06-27 LAB — VITAMIN B12: Vitamin B-12: 3848 pg/mL — ABNORMAL HIGH (ref 180–914)

## 2023-06-27 NOTE — Progress Notes (Signed)
 PROGRESS NOTE  OUIDA Mccoy FMW:[id number] DOB: [birth year]   PCP: Brandi Pagan, NP  Patient is from: Home.  Lives alone but children alternate.  Ambulates with rolling walker.  DOA: 06/24/2023 LOS: 2  Chief complaints Chief Complaint  Patient presents with   Fall   Hip Pain     Brief Narrative / Interim history: 89 year old F with PMH of DM-2, CKD-3B, HTN, HLD, GERD, depression, LBBB, and first-degree AVB presented to ED with left hip pain after accidental ground-level fall, and admitted with left femoral neck fracture.  Patient was walking around the house when her left leg lost control and she fell on the left side.  Denies hitting her head or LOC.  No prodromes.  Left hip x-ray with impacted transcervical left femoral neck fracture.  CT of left hip showed acute transcervical fracture of proximal left femur with mild impaction, age-indeterminate nondisplaced fracture through the proximal segment of the coccyx and bilateral inguinal hernias.  Left knee x-ray without acute finding.  EKG sinus rhythm with first-degree AVB and LBBB unchanged from prior.  Orthopedic surgery consulted.  Echocardiogram ordered.  Echocardiogram without significant finding.  Patient underwent left hip hemiarthroplasty by Dr. Edna on 12/29.  No postop complication.  Therapy recommended SNF.  Subjective: Seen and examined earlier this morning.  No major events overnight of this morning.  Sleepy but wakes to voice.  Fairly oriented.  Has no complaints.  Objective: Vitals:   06/27/23 0607 06/27/23 1035 06/27/23 1037 06/27/23 1315  BP: (!) 143/58 (!) 163/59 (!) 163/59 (!) 130/57  Pulse: 70 71  69  Resp: 17   15  Temp: 98.6 F (37 C)   97.8 F (36.6 C)  TempSrc: Oral     SpO2: 96%   96%  Weight:      Height:        Examination:  GENERAL: No apparent distress.  Nontoxic. Appears frail HEENT: MMM.  Vision and hearing grossly intact.  NECK: Supple.  No apparent JVD.  RESP:  No IWOB.  Fair aeration  bilaterally. CVS:  RRR. Heart sounds normal.  ABD/GI/GU: BS+. Abd soft, NTND.  MSK/EXT: No apparent deformity.  Significant muscle mass and subcu fat loss.   NEURO: Sleepy but wakes to voice.  Fairly oriented.  No apparent focal neuro deficit. PSYCH: Calm. Normal affect.   Procedures:  12/29-left hip hemiarthroplasty by Dr. Edna  Microbiology summarized: MRSA PCR screen nonreactive  Assessment and plan: Mechanical fall at home-ground-level fall.  No prodromes.  Did not hit head or lose consciousness. Acute left femoral neck fracture due to fall -Patient independently ambulates using rolling walker at baseline. -S/p left hip hemiarthroplasty by Dr. Edna on 12/29 -Pain control and VTE prophylaxis per surgery -PT/OT  CKD-3B: Stable Recent Labs    08/01/22 1625 08/12/22 1442 06/24/23 1558 06/25/23 0321 06/26/23 0310 06/26/23 0311  BUN 28 29 25* 28* 25* 25*  CREATININE 1.94* 1.49* 1.28* 1.34* 1.31* 1.29*  -Continue monitoring   Essential hypertension: BP slightly elevated but improved. -Continue amlodipine , hydralazine   Hyperlipidemia -Continue statin-but doubt utility.   Chronic anemia-H&H stable after initial drop.  EBL 150 cc.  Anemia panel with some iron deficiency. Recent Labs    06/24/23 1620 06/25/23 0321 06/26/23 0310 06/27/23 0710  HGB 10.7* 9.4* 8.4* 8.5*  -Continue monitoring -Will add p.o. iron on discharge  Bilateral inguinal hernia: Noted on CT.  No incarceration.  Advance care planning: DNR/DNI.  See discussion on 12/29.  Severe malnutrition: As evidenced by low  BMI and significant muscle mass and subcu fat loss. Body mass index is 17.92 kg/m. Nutrition Problem: Severe Malnutrition Etiology: chronic illness Signs/Symptoms: severe fat depletion, severe muscle depletion, energy intake < or equal to 75% for > or equal to 1 month Interventions: Refer to RD note for recommendations, Ensure Enlive (each supplement provides 350kcal and 20  grams of protein), Magic cup, MVI   DVT prophylaxis:  SCD pending surgery Aspirin  81 mg twice daily  Code Status: DNR/DNI Family Communication: Updated patient's daughter at bedside Level of care: Med-Surg Status is: Inpatient The patient will remain inpatient because: Acute left femoral neck fracture   Final disposition: Likely SNF. Consultants:  Orthopedic surgery  35 minutes with more than 50% spent in reviewing records, counseling patient/family and coordinating care.   Sch Meds:  Scheduled Meds:  amLODipine   5 mg Oral Daily   aspirin  EC  81 mg Oral BID   feeding supplement  237 mL Oral BID BM   hydrALAZINE   50 mg Oral TID   pravastatin   10 mg Oral Daily   Continuous Infusions:   PRN Meds:.acetaminophen , fentaNYL  (SUBLIMAZE ) injection, HYDROcodone -acetaminophen , melatonin, ondansetron  **OR** ondansetron  (ZOFRAN ) IV, polyethylene glycol  Antimicrobials: Anti-infectives (From admission, onward)    Start     Dose/Rate Route Frequency Ordered Stop   06/25/23 2030  ceFAZolin  (ANCEF ) IVPB 2g/100 mL premix        2 g 200 mL/hr over 30 Minutes Intravenous Every 8 hours 06/25/23 1607 06/26/23 0422   06/25/23 1040  ceFAZolin  (ANCEF ) 2-4 GM/100ML-% IVPB  Status:  Discontinued       Note to Pharmacy: Brandi Mccoy : cabinet override      06/25/23 1040 06/25/23 1105   06/25/23 0845  ceFAZolin  (ANCEF ) IVPB 2g/100 mL premix        2 g 200 mL/hr over 30 Minutes Intravenous On call to O.R. 06/25/23 0746 06/25/23 1303   06/25/23 0747  ceFAZolin  (ANCEF ) 2-4 GM/100ML-% IVPB       Note to Pharmacy: Brandi Mccoy M: cabinet override      06/25/23 0747 06/25/23 1237        I have personally reviewed the following labs and images: CBC: Recent Labs  Lab 06/24/23 1620 06/25/23 0321 06/26/23 0310 06/27/23 0710  WBC 7.9 6.7 7.2 8.0  NEUTROABS 6.0  --   --   --   HGB 10.7* 9.4* 8.4* 8.5*  HCT 34.6* 30.3* 28.1* 27.2*  MCV 93.3 94.1 95.6 94.1  PLT 228 214 188 192   BMP  &GFR Recent Labs  Lab 06/24/23 1558 06/25/23 0321 06/26/23 0310 06/26/23 0311  NA 139 138 135 135  K 4.0 3.9 4.3 4.3  CL 107 105 105 105  CO2 21* 22 21* 22  GLUCOSE 120* 101* 81 83  BUN 25* 28* 25* 25*  CREATININE 1.28* 1.34* 1.31* 1.29*  CALCIUM 9.3 8.9 8.4* 8.4*  MG  --   --  2.2  --   PHOS  --   --   --  3.5   Estimated Creatinine Clearance: 16.7 mL/min (A) (by C-G formula based on SCr of 1.29 mg/dL (H)). Liver & Pancreas: Recent Labs  Lab 06/26/23 0311  ALBUMIN  2.7*   No results for input(s): LIPASE, AMYLASE in the last 168 hours. No results for input(s): AMMONIA in the last 168 hours. Diabetic: No results for input(s): HGBA1C in the last 72 hours. Recent Labs  Lab 06/25/23 1137 06/25/23 1429  GLUCAP 68* 66*   Cardiac Enzymes: No results  for input(s): CKTOTAL, CKMB, CKMBINDEX, TROPONINI in the last 168 hours. No results for input(s): PROBNP in the last 8760 hours. Coagulation Profile: Recent Labs  Lab 06/25/23 0321  INR 1.1   Thyroid  Function Tests: No results for input(s): TSH, T4TOTAL, FREET4, T3FREE, THYROIDAB in the last 72 hours. Lipid Profile: No results for input(s): CHOL, HDL, LDLCALC, TRIG, CHOLHDL, LDLDIRECT in the last 72 hours. Anemia Panel: Recent Labs    06/27/23 0710  VITAMINB12 3,848*  FOLATE 7.7  FERRITIN 202  TIBC 151*  IRON 10*  RETICCTPCT 1.2   Urine analysis: No results found for: COLORURINE, APPEARANCEUR, LABSPEC, PHURINE, GLUCOSEU, HGBUR, BILIRUBINUR, KETONESUR, PROTEINUR, UROBILINOGEN, NITRITE, LEUKOCYTESUR Sepsis Labs: Invalid input(s): PROCALCITONIN, LACTICIDVEN  Microbiology: Recent Results (from the past 240 hours)  Surgical PCR screen     Status: None   Collection Time: 06/25/23  1:59 AM   Specimen: Nasal Mucosa; Nasal Swab  Result Value Ref Range Status   MRSA, PCR NEGATIVE NEGATIVE Final   Staphylococcus aureus NEGATIVE NEGATIVE Final     Comment: (NOTE) The Xpert SA Assay (FDA approved for NASAL specimens in patients 46 years of age and older), is one component of a comprehensive surveillance program. It is not intended to diagnose infection nor to guide or monitor treatment. Performed at [facility], [address], [place], [place] [address]     Radiology Studies: No results found.     Brae Schaafsma T. Duana Benedict [facility]  If 7PM-7AM, please contact night-coverage [email or web address] 06/27/2023, 1:50 PM

## 2023-06-27 NOTE — TOC Initial Note (Signed)
 Transition of Care [facility]) - Initial/Assessment Note   Patient Details  Name: Brandi Mccoy MRN: [id number] Date of Birth: Jan 01, 1922  Transition of Care [facility]) CM/SW Contact:    Duwaine GORMAN Aran, LCSW Phone Number: 06/27/2023, 8:37 AM  Clinical Narrative: PT evaluation recommended SNF. CSW spoke with daughters, Harland Dolly and [name], regarding recommendations. Daughters agreeable to SNF. FL2 done; PASRR received. Initial referral faxed out.                Expected Discharge Plan: Skilled Nursing Facility Barriers to Discharge: Continued Medical Work up, SNF Pending bed offer  Patient Goals and CMS Choice Patient states their goals for this hospitalization and ongoing recovery are:: Go to rehab Choice offered to / list presented to : Adult Children, Patient  Expected Discharge Plan and Services In-house Referral: Clinical Social Work Post Acute Care Choice: Skilled Nursing Facility Living arrangements for the past 2 months: Single Family Home          DME Arranged: N/A DME Agency: NA  Prior Living Arrangements/Services Living arrangements for the past 2 months: Single Family Home Lives with:: Adult Children Patient language and need for interpreter reviewed:: Yes Do you feel safe going back to the place where you live?: Yes      Need for Family Participation in Patient Care: Yes (Comment) Care giver support system in place?: Yes (comment) Criminal Activity/Legal Involvement Pertinent to Current Situation/Hospitalization: No - Comment as needed  Activities of Daily Living ADL Screening (condition at time of admission) Independently performs ADLs?: Yes (appropriate for developmental age) Is the patient deaf or have difficulty hearing?: Yes Does the patient have difficulty seeing, even when wearing glasses/contacts?: Yes Does the patient have difficulty concentrating, remembering, or making decisions?: No  Permission Sought/Granted Permission sought to share information  with : Oceanographer granted to share information with : Yes, Verbal Permission Granted Permission granted to share info w AGENCY: SNFs  Emotional Assessment Orientation: : Oriented to Self, Oriented to Place, Oriented to  Time, Oriented to Situation Alcohol  / Substance Use: Not Applicable Psych Involvement: No (comment)  Admission diagnosis:  Hip fracture (HCC) [S72.009A] Fall, initial encounter [W19.XXXA] Closed displaced fracture of left femoral neck (HCC) [S72.002A] Patient Active Problem List   Diagnosis Date Noted   Protein-calorie malnutrition, severe 06/27/2023   Preoperative clearance 06/25/2023   Advance care planning 06/25/2023   Chronic kidney disease, stage 3b (HCC) 06/25/2023   Underweight (BMI < 18.5) 06/25/2023   Closed displaced fracture of left femoral neck (HCC) 06/25/2023   Fracture of femoral neck, left, closed (HCC) 06/24/2023   L1 vertebral fracture (HCC) 11/14/2015   Compression fracture of lumbar vertebra (HCC) 11/13/2015   Dyspnea 10/10/2013   Palpitations 10/10/2013   INGUINAL HERNIA 04/20/2010   Essential hypertension 03/30/2010   UNSPECIFIED INFECTION OF KIDNEY 03/30/2010   Loss of weight 03/30/2010   DIARRHEA 03/30/2010   Abdominal pain, other specified site 03/30/2010   History of colonic polyps 03/30/2010   COLONIC POLYPS, ADENOMATOUS 08/21/2007   DM2 (diabetes mellitus, type 2) (HCC) 08/21/2007   Hyperlipidemia LDL goal <70 08/21/2007   DEPRESSION 08/21/2007   GERD 08/21/2007   DIVERTICULOSIS, COLON 08/21/2007   ARTHRITIS 08/21/2007   OSTEOPOROSIS 08/21/2007   PCP:  Corlis Pagan, NP Pharmacy:   [facility] [phone] - [place] [place] - [address] AT [address] OF [facility] [address] [place] [address] Phone: [phone] Fax: [phone]  Social Drivers of Health (SDOH) Social History: SDOH Screenings  Food Insecurity: No Food Insecurity (06/24/2023)  Housing: Low Risk   (06/24/2023)  Transportation Needs: No Transportation Needs (06/24/2023)  Utilities: Not At Risk (06/24/2023)  Social Connections: Unknown (06/26/2023)  Tobacco Use: Low Risk  (06/25/2023)   SDOH Interventions:    Readmission Risk Interventions     No data to display

## 2023-06-27 NOTE — Plan of Care (Signed)

## 2023-06-27 NOTE — TOC Progression Note (Signed)
 Transition of Care [facility]) - Progression Note   Patient Details  Name: Brandi Mccoy MRN: [id number] Date of Birth: [birth year]  [facility]) CM/SW Contact  Duwaine GORMAN Aran, LCSW Phone Number: 06/27/2023, 3:35 PM  Clinical Narrative: CSW provided the following bed offers to daughter for review (patient was sleeping in room):  [facility] [address] [place], [place] [address] [phone] Overall rating ??? Average  [facility] [address] [place], [place] [address] [phone] Overall rating ????? Much above average  [facility] [address] [place], [place] [address] [phone] Overall rating ???? Above average  [facility] [address] [place], [place] [address] [phone] Overall rating ??? Average  [facility] [address] [place], [place] [address] [phone] Overall rating ????? Much above average  Expected Discharge Plan: Skilled Nursing Facility Barriers to Discharge: Continued Medical Work up, SNF Pending bed offer  Expected Discharge Plan and Services In-house Referral: Clinical Social Work Post Acute Care Choice: Skilled Nursing Facility Living arrangements for the past 2 months: Single Family Home           DME Arranged: N/A DME Agency: NA  Social Determinants of Health (SDOH) Interventions SDOH Screenings   Food Insecurity: No Food Insecurity (06/24/2023)  Housing: Low Risk  (06/24/2023)  Transportation Needs: No Transportation Needs (06/24/2023)  Utilities: Not At Risk (06/24/2023)  Social Connections: Unknown (06/26/2023)  Tobacco Use: Low Risk  (06/25/2023)   Readmission Risk Interventions     No data to display

## 2023-06-27 NOTE — [place] FL2 (Signed)
   MEDICAID [id number] LEVEL OF CARE FORM     IDENTIFICATION  Patient Name: Brandi Mccoy Birthdate: 12-Feb-1922 Sex: female Admission Date (Current Location): 06/24/2023  [facility] and [place] Number:  Producer, Television/film/video and Address:  [facility],  [address] [place], [place] [address]      Provider Number: [id number]  Attending Physician Name and Address:  Kathrin Mignon DASEN, MD  Relative Name and Phone Number:  [name] (daughter) Ph: [phone]    Current Level of Care: Hospital Recommended Level of Care: Skilled Nursing Facility Prior Approval Number:    Date Approved/Denied:   PASRR Number: [id number]  Discharge Plan: SNF    Current Diagnoses: Patient Active Problem List   Diagnosis Date Noted   Protein-calorie malnutrition, severe 06/27/2023   Preoperative clearance 06/25/2023   Advance care planning 06/25/2023   Chronic kidney disease, stage 3b (HCC) 06/25/2023   Underweight (BMI < 18.5) 06/25/2023   Closed displaced fracture of left femoral neck (HCC) 06/25/2023   Fracture of femoral neck, left, closed (HCC) 06/24/2023   L1 vertebral fracture (HCC) 11/14/2015   Compression fracture of lumbar vertebra (HCC) 11/13/2015   Dyspnea 10/10/2013   Palpitations 10/10/2013   INGUINAL HERNIA 04/20/2010   Essential hypertension 03/30/2010   UNSPECIFIED INFECTION OF KIDNEY 03/30/2010   Loss of weight 03/30/2010   DIARRHEA 03/30/2010   Abdominal pain, other specified site 03/30/2010   History of colonic polyps 03/30/2010   COLONIC POLYPS, ADENOMATOUS 08/21/2007   DM2 (diabetes mellitus, type 2) (HCC) 08/21/2007   Hyperlipidemia LDL goal <70 08/21/2007   DEPRESSION 08/21/2007   GERD 08/21/2007   DIVERTICULOSIS, COLON 08/21/2007   ARTHRITIS 08/21/2007   OSTEOPOROSIS 08/21/2007    Orientation RESPIRATION BLADDER Height & Weight     Self, Time, Situation, Place  Normal Continent Weight: 98 lb (44.5 kg) Height:  5' 2 (157.5 cm)  BEHAVIORAL  SYMPTOMS/MOOD NEUROLOGICAL BOWEL NUTRITION STATUS      Continent Diet (Carb modified)  AMBULATORY STATUS COMMUNICATION OF NEEDS Skin   Extensive Assist Verbally Surgical wounds, Other (Comment), Skin abrasions (Abrasions: bilateral legs, arms; Ecchymosis: bilateral arms, legs)                       Personal Care Assistance Level of Assistance  Bathing, Feeding, Dressing Bathing Assistance: Limited assistance Feeding assistance: Independent Dressing Assistance: Limited assistance     Functional Limitations Info  Sight, Hearing, Speech Sight Info: Impaired Hearing Info: Impaired Speech Info: Adequate    SPECIAL CARE FACTORS FREQUENCY  PT (By licensed PT), OT (By licensed OT)     PT Frequency: 5x's/week OT Frequency: 5x's/week            Contractures Contractures Info: Not present    Additional Factors Info  Code Status, Allergies Code Status Info: DNR Allergies Info: NKA           Current Medications (06/27/2023):  This is the current hospital active medication list Current Facility-Administered Medications  Medication Dose Route Frequency Provider Last Rate Last Admin   acetaminophen  (TYLENOL ) tablet 650 mg  650 mg Oral Q6H PRN Gonfa, Taye T, MD       amLODipine  (NORVASC ) tablet 5 mg  5 mg Oral Daily Cockerham, Alicia M, PA-C   5 mg at 06/26/23 1111   aspirin  EC tablet 81 mg  81 mg Oral BID Cockerham, Alicia M, PA-C   81 mg at 06/26/23 2106   feeding supplement (ENSURE ENLIVE / ENSURE PLUS) liquid 237  mL  237 mL Oral BID BM Gonfa, Taye T, MD   237 mL at 06/26/23 1409   fentaNYL  (SUBLIMAZE ) injection 12.5 mcg  12.5 mcg Intravenous Q2H PRN Cockerham, Alicia M, PA-C       hydrALAZINE  (APRESOLINE ) tablet 50 mg  50 mg Oral TID Cockerham, Alicia M, PA-C   50 mg at 06/26/23 2106   HYDROcodone -acetaminophen  (NORCO/VICODIN) 5-325 MG per tablet 1 tablet  1 tablet Oral Q6H PRN Cockerham, Alicia M, PA-C   1 tablet at 06/26/23 9156   melatonin tablet 5 mg  5 mg Oral QHS PRN  Cockerham, Alicia M, PA-C   5 mg at 06/25/23 2103   ondansetron  (ZOFRAN ) tablet 4 mg  4 mg Oral Q6H PRN Cockerham, Alicia M, PA-C       Or   ondansetron  (ZOFRAN ) injection 4 mg  4 mg Intravenous Q6H PRN Cockerham, Alicia M, PA-C       polyethylene glycol (MIRALAX  / GLYCOLAX ) packet 17 g  17 g Oral Daily PRN Cockerham, Alicia M, PA-C       pravastatin  (PRAVACHOL ) tablet 10 mg  10 mg Oral Daily Cockerham, Alicia M, PA-C   10 mg at 06/26/23 1111     Discharge Medications: Please see discharge summary for a list of discharge medications.  Relevant Imaging Results:  Relevant Lab Results:   Additional Information SSN: [id number]  Duwaine GORMAN Aran, LCSW

## 2023-06-27 NOTE — Progress Notes (Signed)
     2 Days Post-Op Procedure(s) (LRB): ARTHROPLASTY BIPOLAR HIP (HEMIARTHROPLASTY) (Left)  Subjective:  Patient reports pain as mild, some better than yesterday.  Slept okay.  Advancing diet.  Able to stand with PT, hopeful for more today.  No reports of chest pain, ShOB, N/V.  Denies numbness or tingling.  FMs at bedside.  No other complaints at this time.  Objective:   VITALS:   Vitals:   06/26/23 1702 06/26/23 2104 06/27/23 0244 06/27/23 0607  BP: (!) 144/52 (!) 151/52 (!) 156/57 (!) 143/58  Pulse: 63 67 66 70  Resp:  17 17 17   Temp:  98.4 F (36.9 C) 98 F (36.7 C) 98.6 F (37 C)  TempSrc:  Axillary Oral Oral  SpO2:   96% 96%  Weight:      Height:        AAOx4, resting comfortably, in NAD Neurovascular intact Sensation intact distally Intact pulses distally Dorsiflexion/Plantar flexion intact Incision: dressing C/D/I Compartment soft Wiggles toes appropriately   Lab Results  Component Value Date   WBC 7.2 06/26/2023   HGB 8.4 (L) 06/26/2023   HCT 28.1 (L) 06/26/2023   MCV 95.6 06/26/2023   PLT 188 06/26/2023   BMET    Component Value Date/Time   NA 135 06/26/2023 0311   NA 142 08/12/2022 1442   K 4.3 06/26/2023 0311   CL 105 06/26/2023 0311   CO2 22 06/26/2023 0311   GLUCOSE 83 06/26/2023 0311   BUN 25 (H) 06/26/2023 0311   BUN 29 08/12/2022 1442   CREATININE 1.29 (H) 06/26/2023 0311   CALCIUM 8.4 (L) 06/26/2023 0311   EGFR 35.0 10/06/2022 1038   EGFR 32 (L) 08/12/2022 1442   GFRNONAA 37 (L) 06/26/2023 0311     Xray: Stable post-operative imaging  Assessment/Plan: 2 Days Post-Op   Principal Problem:   Fracture of femoral neck, left, closed (HCC) Active Problems:   Preoperative clearance   Advance care planning   Chronic kidney disease, stage 3b (HCC)   Underweight (BMI < 18.5)   Closed displaced fracture of left femoral neck (HCC)  Continue to mobilize with PT.  WBAT.  Hgb 8.4 yesterday, repeat pending, mild post-operative anemia.   May consider transfusion and/or holding DVT prophylaxis as needed.    Post op recs: WB: WBAT with no precautions Abx: ancef  x23 hours post op Imaging: PACU xrays Dressing: Aquacel dressing to be kept intact until follow-up DVT prophylaxis: aspirin  81mg  BID starting POD1 x4 weeks Follow up: 2 weeks after surgery for a wound check with Dr. Edna at [facility].  Address: [address], [place], [place] [address]  Office Phone: [phone]   Bernarda CHRISTELLA Mclean 06/27/2023, 7:00 AM   Contact information:   Weekdays 7am-5pm epic message Dr. Edna, or call office for patient follow up: [phone] After hours and holidays please check [email or web address] for group call information for [facility]

## 2023-06-27 NOTE — Progress Notes (Signed)
 Physical Therapy Treatment Patient Details Name: Brandi Mccoy MRN: [id number] DOB: 1922-05-07 Today's Date: 06/27/2023   History of Present Illness Patient is a 89 year old female who presented after a fall at home on 12/28 that resulted in L hip pain. Patient was found to have impacted transcervical left femoral neck fracture. Patient underwent posterior L hip hemiarthroplasty  on 12/29. PMH: HTN, anemia, bilateral inguinal hernia,    PT Comments  Pt making steady progress this session. Requiring less assist for bed mobility and transfers, improved wt shift and decr pain allowing pt to take multiple small steps bed to chair.     If plan is discharge home, recommend the following: A lot of help with walking and/or transfers;A lot of help with bathing/dressing/bathroom;Assistance with cooking/housework;Assist for transportation;Help with stairs or ramp for entrance   Can travel by private vehicle     No (may progress to yes for car xfer)  Equipment Recommendations  None recommended by PT    Recommendations for Other Services       Precautions / Restrictions Precautions Precautions: Fall Precaution Comments: posterior hemi, no precautions Restrictions Weight Bearing Restrictions Per Provider Order: No LLE Weight Bearing Per Provider Order: Weight bearing as tolerated     Mobility  Bed Mobility Overal bed mobility: Needs Assistance Bed Mobility: Supine to Sit     Supine to sit: Min assist, Mod assist     General bed mobility comments: assist to laterally scoot hips toward EOB and to elevate trunk, cues for technique and self assist    Transfers Overall transfer level: Needs assistance Equipment used: Rolling walker (2 wheels) Transfers: Sit to/from Stand, Bed to chair/wheelchair/BSC Sit to Stand: Min assist, Mod assist   Step pivot transfers: Mod assist       General transfer comment: cues for hand placement, assist to power up and transition to stand; assist to  balance while wt shifting, cues for sequence and to widen BOS. pt able to take multiple steps bed to chair    Ambulation/Gait                   Stairs             Wheelchair Mobility     Tilt Bed    Modified Rankin (Stroke Patients Only)       Balance   Sitting-balance support: Feet supported, No upper extremity supported Sitting balance-Leahy Scale: Fair     Standing balance support: Bilateral upper extremity supported, Reliant on assistive device for balance Standing balance-Leahy Scale: Poor Standing balance comment: reliant on external assist and UE support                            Cognition Arousal: Alert Behavior During Therapy: WFL for tasks assessed/performed Overall Cognitive Status: Within Functional Limits for tasks assessed                                 General Comments: appears WFL, dtr answers some questions pt is [facility]        Exercises General Exercises - Lower Extremity Ankle Circles/Pumps: AROM, Both, 5 reps Heel Slides: AAROM, Left, 10 reps    General Comments        Pertinent Vitals/Pain Pain Assessment Pain Assessment: Faces Faces Pain Scale: Hurts little more Pain Location: LLE with activity Pain Descriptors / Indicators: Grimacing, Operative site guarding  Pain Intervention(s): Limited activity within patient's tolerance, Monitored during session, Premedicated before session    Home Living                          Prior Function            PT Goals (current goals can now be found in the care plan section) Acute Rehab PT Goals Patient Stated Goal: walk PT Goal Formulation: With patient Time For Goal Achievement: 07/10/23 Potential to Achieve Goals: Good Progress towards PT goals: Progressing toward goals    Frequency    Min 1X/week      PT Plan      Co-evaluation              AM-PAC PT 6 Clicks Mobility   Outcome Measure  Help needed turning from your  back to your side while in a flat bed without using bedrails?: A Lot Help needed moving from lying on your back to sitting on the side of a flat bed without using bedrails?: A Lot Help needed moving to and from a bed to a chair (including a wheelchair)?: A Lot Help needed standing up from a chair using your arms (e.g., wheelchair or bedside chair)?: A Lot Help needed to walk in hospital room?: A Lot Help needed climbing 3-5 steps with a railing? : Total 6 Click Score: 11    End of Session Equipment Utilized During Treatment: Gait belt Activity Tolerance: Patient tolerated treatment well Patient left: in chair;with call bell/phone within reach;with chair alarm set   PT Visit Diagnosis: Unsteadiness on feet (R26.81);Difficulty in walking, not elsewhere classified (R26.2);History of falling (Z91.81)     Time: 8767-8740 PT Time Calculation (min) (ACUTE ONLY): 27 min  Charges:    $Therapeutic Activity: 23-37 mins PT General Charges $$ ACUTE PT VISIT: 1 Visit                     Erian Lariviere, PT  Acute Rehab Dept [facility]) [phone]  06/27/2023    [facility] 06/27/2023, 1:26 PM

## 2023-06-28 DIAGNOSIS — Z01818 Encounter for other preprocedural examination: Secondary | ICD-10-CM | POA: Diagnosis not present

## 2023-06-28 DIAGNOSIS — Z7189 Other specified counseling: Secondary | ICD-10-CM | POA: Diagnosis not present

## 2023-06-28 DIAGNOSIS — S72002A Fracture of unspecified part of neck of left femur, initial encounter for closed fracture: Secondary | ICD-10-CM | POA: Diagnosis not present

## 2023-06-28 DIAGNOSIS — N1832 Chronic kidney disease, stage 3b: Secondary | ICD-10-CM | POA: Diagnosis not present

## 2023-06-28 MED ORDER — SENNOSIDES-DOCUSATE SODIUM 8.6-50 MG PO TABS
1.0000 | ORAL_TABLET | Freq: Two times a day (BID) | ORAL | Status: AC | PRN
Start: 2023-06-28 — End: ?

## 2023-06-28 MED ORDER — ACETAMINOPHEN 325 MG PO TABS: ORAL_TABLET | ORAL | Status: AC

## 2023-06-28 MED ORDER — MELATONIN 5 MG PO TABS: 5.0000 mg | ORAL_TABLET | Freq: Every evening | ORAL | Status: AC | PRN

## 2023-06-28 MED ORDER — ENSURE ENLIVE PO LIQD: 237.0000 mL | Freq: Two times a day (BID) | ORAL | Status: AC

## 2023-06-28 MED ORDER — POLYETHYLENE GLYCOL 3350 17 GM/SCOOP PO POWD: 17.0000 g | Freq: Two times a day (BID) | ORAL | Status: AC | PRN

## 2023-06-28 NOTE — Progress Notes (Signed)
 PROGRESS NOTE  Brandi Mccoy FMW:[id number] DOB: [birth year]   PCP: Corlis Pagan, NP  Patient is from: Home.  Lives alone but children alternate.  Ambulates with rolling walker.  DOA: 06/24/2023 LOS: 3  Chief complaints Chief Complaint  Patient presents with   Fall   Hip Pain     Brief Narrative / Interim history: 89 year old F with PMH of DM-2, CKD-3B, HTN, HLD, GERD, depression, LBBB, and first-degree AVB presented to ED with left hip pain after accidental ground-level fall, and admitted with left femoral neck fracture.  Patient was walking around the house when her left leg lost control and she fell on the left side.  Denies hitting her head or LOC.  No prodromes.  Left hip x-ray with impacted transcervical left femoral neck fracture.  CT of left hip showed acute transcervical fracture of proximal left femur with mild impaction, age-indeterminate nondisplaced fracture through the proximal segment of the coccyx and bilateral inguinal hernias.  Left knee x-ray without acute finding.  EKG sinus rhythm with first-degree AVB and LBBB unchanged from prior.  Orthopedic surgery consulted.  Echocardiogram ordered.  Echocardiogram without significant finding.  Patient underwent left hip hemiarthroplasty by Dr. Edna on 12/29.  No postop complication.  Therapy recommended SNF.  Family and TOC working on this.  Subjective: Seen and examined earlier this morning.  No major events overnight of this morning.  No complaints.  Pain fairly controlled.  Objective: Vitals:   06/27/23 1037 06/27/23 1315 06/27/23 2339 06/28/23 0558  BP: (!) 163/59 (!) 130/57 (!) 131/51 (!) 145/56  Pulse:  69 68 66  Resp:  15 18 16   Temp:  97.8 F (36.6 C) 98.3 F (36.8 C) 98.1 F (36.7 C)  TempSrc:  Oral    SpO2:  96% 94% 97%  Weight:      Height:        Examination:  GENERAL: No apparent distress.  Nontoxic. Appears frail HEENT: MMM.  Vision grossly intact.  Diminished hearing. NECK: Supple.  No  apparent JVD.  RESP:  No IWOB.  Fair aeration bilaterally. CVS:  RRR. Heart sounds normal.  ABD/GI/GU: BS+. Abd soft, NTND.  MSK/EXT: No apparent deformity.  Significant muscle mass and subcu fat loss.   NEURO: Awake and alert. .  Fairly oriented.  No apparent focal neuro deficit. PSYCH: Calm. Normal affect.   Procedures:  12/29-left hip hemiarthroplasty by Dr. Edna  Microbiology summarized: MRSA PCR screen nonreactive  Assessment and plan: Mechanical fall at home-ground-level fall.  No prodromes.  Did not hit head or lose consciousness. Acute left femoral neck fracture due to fall -Patient independently ambulates using rolling walker at baseline. -S/p left hip hemiarthroplasty by Dr. Edna on 12/29 -Pain control and VTE prophylaxis per surgery -PT/OT-recommended SNF.  Family and TOC working on this.  CKD-3B: Stable Recent Labs    08/01/22 1625 08/12/22 1442 06/24/23 1558 06/25/23 0321 06/26/23 0310 06/26/23 0311  BUN 28 29 25* 28* 25* 25*  CREATININE 1.94* 1.49* 1.28* 1.34* 1.31* 1.29*  -Continue monitoring   Essential hypertension: BP slightly elevated but improved. -Continue amlodipine , hydralazine   Hyperlipidemia -Continue statin-but doubt utility.   Chronic anemia-H&H stable after initial drop.  EBL 150 cc.  Anemia panel with some iron deficiency. Recent Labs    06/24/23 1620 06/25/23 0321 06/26/23 0310 06/27/23 0710  HGB 10.7* 9.4* 8.4* 8.5*  -Continue monitoring -Will add p.o. iron on discharge  Bilateral inguinal hernia: Noted on CT.  No incarceration.  Advance care planning: DNR/DNI.  See discussion on 12/29.  Severe malnutrition: As evidenced by low BMI and significant muscle mass and subcu fat loss. Body mass index is 17.92 kg/m. Nutrition Problem: Severe Malnutrition Etiology: chronic illness Signs/Symptoms: severe fat depletion, severe muscle depletion, energy intake < or equal to 75% for > or equal to 1 month Interventions: Refer  to RD note for recommendations, Ensure Enlive (each supplement provides 350kcal and 20 grams of protein), Magic cup, MVI   DVT prophylaxis:  SCD pending surgery Aspirin  81 mg twice daily  Code Status: DNR/DNI Family Communication: Updated patient's daughter at bedside Level of care: Med-Surg Status is: Inpatient The patient will remain inpatient because: Acute left femoral neck fracture   Final disposition: Likely SNF. Consultants:  Orthopedic surgery  35 minutes with more than 50% spent in reviewing records, counseling patient/family and coordinating care.   Sch Meds:  Scheduled Meds:  amLODipine   5 mg Oral Daily   aspirin  EC  81 mg Oral BID   feeding supplement  237 mL Oral BID BM   hydrALAZINE   50 mg Oral TID   pravastatin   10 mg Oral Daily   Continuous Infusions:   PRN Meds:.acetaminophen , fentaNYL  (SUBLIMAZE ) injection, HYDROcodone -acetaminophen , melatonin, ondansetron  **OR** ondansetron  (ZOFRAN ) IV, polyethylene glycol  Antimicrobials: Anti-infectives (From admission, onward)    Start     Dose/Rate Route Frequency Ordered Stop   06/25/23 2030  ceFAZolin  (ANCEF ) IVPB 2g/100 mL premix        2 g 200 mL/hr over 30 Minutes Intravenous Every 8 hours 06/25/23 1607 06/26/23 0422   06/25/23 1040  ceFAZolin  (ANCEF ) 2-4 GM/100ML-% IVPB  Status:  Discontinued       Note to Pharmacy: Merla Norris : cabinet override      06/25/23 1040 06/25/23 1105   06/25/23 0845  ceFAZolin  (ANCEF ) IVPB 2g/100 mL premix        2 g 200 mL/hr over 30 Minutes Intravenous On call to O.R. 06/25/23 0746 06/25/23 1303   06/25/23 0747  ceFAZolin  (ANCEF ) 2-4 GM/100ML-% IVPB       Note to Pharmacy: Victory Meth M: cabinet override      06/25/23 0747 06/25/23 1237        I have personally reviewed the following labs and images: CBC: Recent Labs  Lab 06/24/23 1620 06/25/23 0321 06/26/23 0310 06/27/23 0710  WBC 7.9 6.7 7.2 8.0  NEUTROABS 6.0  --   --   --   HGB 10.7* 9.4* 8.4* 8.5*   HCT 34.6* 30.3* 28.1* 27.2*  MCV 93.3 94.1 95.6 94.1  PLT 228 214 188 192   BMP &GFR Recent Labs  Lab 06/24/23 1558 06/25/23 0321 06/26/23 0310 06/26/23 0311  NA 139 138 135 135  K 4.0 3.9 4.3 4.3  CL 107 105 105 105  CO2 21* 22 21* 22  GLUCOSE 120* 101* 81 83  BUN 25* 28* 25* 25*  CREATININE 1.28* 1.34* 1.31* 1.29*  CALCIUM 9.3 8.9 8.4* 8.4*  MG  --   --  2.2  --   PHOS  --   --   --  3.5   Estimated Creatinine Clearance: 16.7 mL/min (A) (by C-G formula based on SCr of 1.29 mg/dL (H)). Liver & Pancreas: Recent Labs  Lab 06/26/23 0311  ALBUMIN  2.7*   No results for input(s): LIPASE, AMYLASE in the last 168 hours. No results for input(s): AMMONIA in the last 168 hours. Diabetic: No results for input(s): HGBA1C in the last 72 hours. Recent Labs  Lab 06/25/23 1137 06/25/23  1429  GLUCAP 68* 66*   Cardiac Enzymes: No results for input(s): CKTOTAL, CKMB, CKMBINDEX, TROPONINI in the last 168 hours. No results for input(s): PROBNP in the last 8760 hours. Coagulation Profile: Recent Labs  Lab 06/25/23 0321  INR 1.1   Thyroid  Function Tests: No results for input(s): TSH, T4TOTAL, FREET4, T3FREE, THYROIDAB in the last 72 hours. Lipid Profile: No results for input(s): CHOL, HDL, LDLCALC, TRIG, CHOLHDL, LDLDIRECT in the last 72 hours. Anemia Panel: Recent Labs    06/27/23 0710  VITAMINB12 3,848*  FOLATE 7.7  FERRITIN 202  TIBC 151*  IRON 10*  RETICCTPCT 1.2   Urine analysis: No results found for: COLORURINE, APPEARANCEUR, LABSPEC, PHURINE, GLUCOSEU, HGBUR, BILIRUBINUR, KETONESUR, PROTEINUR, UROBILINOGEN, NITRITE, LEUKOCYTESUR Sepsis Labs: Invalid input(s): PROCALCITONIN, LACTICIDVEN  Microbiology: Recent Results (from the past 240 hours)  Surgical PCR screen     Status: None   Collection Time: 06/25/23  1:59 AM   Specimen: Nasal Mucosa; Nasal Swab  Result Value Ref Range Status   MRSA,  PCR NEGATIVE NEGATIVE Final   Staphylococcus aureus NEGATIVE NEGATIVE Final    Comment: (NOTE) The Xpert SA Assay (FDA approved for NASAL specimens in patients 16 years of age and older), is one component of a comprehensive surveillance program. It is not intended to diagnose infection nor to guide or monitor treatment. Performed at [facility], [address] [place], [place] [address]     Radiology Studies: No results found.     Tyson Parkison T. Dalina Samara [facility]  If 7PM-7AM, please contact night-coverage [email or web address] 06/28/2023, 1:02 PM

## 2023-06-28 NOTE — TOC Progression Note (Addendum)
 Transition of Care [facility]) - Progression Note    Patient Details  Name: Brandi Mccoy MRN: [id number] Date of Birth: [birth year]  [facility] of [facility] CM/SW Contact  Dalila Camellia [place], [place] Phone Number: 06/28/2023, 10:19 AM  Clinical Narrative:     CSW spoke to patient's daughter Harland, [phone] to discuss bed offers and find out if they have made a choice on a facility.  CSW informed daughter that patient has a discharge order in place, and a decision for a facility should be made very quickly so she does not lose the bed offer.  CSW explained that if they go to a facility, and decide they don't like the care, they can always ask to transfer to a different facility.  CSW explained how insurance will pay for stay at SNF and what to expect.  CSW requested that they make a decision quickly so the bed can be locked in and the facilities can get meds ordered, because of the holiday some of the pharmacies will have to have orders early.  Patient's daughter said she will discuss with her sister Dorothe, and call CSW back.  CSW awaiting for decision from daughters. 11:54am Daughter Harland called CSW back and she said they were trying to decide between [facility], [facility], and [facility].  1:08pm CSW spoke to patient's daughter Harland again and she said it was down to either [facility] or [facility], they then ii asked if CSW knew if [facility] would allow someone to stay overnight with patient, CSW informed her that this CSW was unaware, and offered to have [facility] talk to her about it.  CSW contacted Star at [facility], and she will call daughter to discuss and answer questions for her, CSW then asked daughter to call this CSW after they make the final decision.  Per both SNFs they can accept patient tomorrow whichever one family decides on.  4:00pm CSW received phone call from patient's daughter Harland, they have decided on [facility].  CSW updated Star, per Star they can accept patient tomorrow if she  is medically ready for discharge.  TOC to continue to follow patient's progress throughout discharge planning.   Expected Discharge Plan: Skilled Nursing Facility Barriers to Discharge: Continued Medical Work up, SNF Pending bed offer  Expected Discharge Plan and Services In-house Referral: Clinical Social Work   Post Acute Care Choice: Skilled Nursing Facility Living arrangements for the past 2 months: Single Family Home Expected Discharge Date: 06/28/23               DME Arranged: N/A DME Agency: NA                   Social Determinants of Health (SDOH) Interventions SDOH Screenings   Food Insecurity: No Food Insecurity (06/24/2023)  Housing: Low Risk  (06/24/2023)  Transportation Needs: No Transportation Needs (06/24/2023)  Utilities: Not At Risk (06/24/2023)  Social Connections: Unknown (06/26/2023)  Tobacco Use: Low Risk  (06/25/2023)    Readmission Risk Interventions     No data to display

## 2023-06-29 DIAGNOSIS — Z23 Encounter for immunization: Secondary | ICD-10-CM | POA: Diagnosis not present

## 2023-06-29 DIAGNOSIS — S51002A Unspecified open wound of left elbow, initial encounter: Secondary | ICD-10-CM | POA: Diagnosis not present

## 2023-06-29 DIAGNOSIS — M6250 Muscle wasting and atrophy, not elsewhere classified, unspecified site: Secondary | ICD-10-CM | POA: Diagnosis not present

## 2023-06-29 DIAGNOSIS — N1832 Chronic kidney disease, stage 3b: Secondary | ICD-10-CM | POA: Diagnosis not present

## 2023-06-29 DIAGNOSIS — M6281 Muscle weakness (generalized): Secondary | ICD-10-CM | POA: Diagnosis not present

## 2023-06-29 DIAGNOSIS — Z01818 Encounter for other preprocedural examination: Secondary | ICD-10-CM | POA: Diagnosis not present

## 2023-06-29 DIAGNOSIS — E785 Hyperlipidemia, unspecified: Secondary | ICD-10-CM | POA: Diagnosis not present

## 2023-06-29 DIAGNOSIS — Z7189 Other specified counseling: Secondary | ICD-10-CM | POA: Diagnosis not present

## 2023-06-29 DIAGNOSIS — F32A Depression, unspecified: Secondary | ICD-10-CM | POA: Diagnosis not present

## 2023-06-29 DIAGNOSIS — K4020 Bilateral inguinal hernia, without obstruction or gangrene, not specified as recurrent: Secondary | ICD-10-CM | POA: Diagnosis not present

## 2023-06-29 DIAGNOSIS — M810 Age-related osteoporosis without current pathological fracture: Secondary | ICD-10-CM | POA: Diagnosis not present

## 2023-06-29 DIAGNOSIS — E43 Unspecified severe protein-calorie malnutrition: Secondary | ICD-10-CM

## 2023-06-29 DIAGNOSIS — M129 Arthropathy, unspecified: Secondary | ICD-10-CM | POA: Diagnosis not present

## 2023-06-29 DIAGNOSIS — E1122 Type 2 diabetes mellitus with diabetic chronic kidney disease: Secondary | ICD-10-CM | POA: Diagnosis not present

## 2023-06-29 DIAGNOSIS — E119 Type 2 diabetes mellitus without complications: Secondary | ICD-10-CM | POA: Diagnosis not present

## 2023-06-29 DIAGNOSIS — R2689 Other abnormalities of gait and mobility: Secondary | ICD-10-CM | POA: Diagnosis not present

## 2023-06-29 DIAGNOSIS — M4692 Unspecified inflammatory spondylopathy, cervical region: Secondary | ICD-10-CM | POA: Diagnosis not present

## 2023-06-29 DIAGNOSIS — E1169 Type 2 diabetes mellitus with other specified complication: Secondary | ICD-10-CM | POA: Diagnosis not present

## 2023-06-29 DIAGNOSIS — Z7984 Long term (current) use of oral hypoglycemic drugs: Secondary | ICD-10-CM | POA: Diagnosis not present

## 2023-06-29 DIAGNOSIS — I129 Hypertensive chronic kidney disease with stage 1 through stage 4 chronic kidney disease, or unspecified chronic kidney disease: Secondary | ICD-10-CM | POA: Diagnosis not present

## 2023-06-29 DIAGNOSIS — R531 Weakness: Secondary | ICD-10-CM | POA: Diagnosis not present

## 2023-06-29 DIAGNOSIS — Z9181 History of falling: Secondary | ICD-10-CM | POA: Diagnosis not present

## 2023-06-29 DIAGNOSIS — R1311 Dysphagia, oral phase: Secondary | ICD-10-CM | POA: Diagnosis not present

## 2023-06-29 DIAGNOSIS — S72002A Fracture of unspecified part of neck of left femur, initial encounter for closed fracture: Secondary | ICD-10-CM | POA: Diagnosis not present

## 2023-06-29 DIAGNOSIS — T8484XD Pain due to internal orthopedic prosthetic devices, implants and grafts, subsequent encounter: Secondary | ICD-10-CM | POA: Diagnosis not present

## 2023-06-29 DIAGNOSIS — L89159 Pressure ulcer of sacral region, unspecified stage: Secondary | ICD-10-CM | POA: Diagnosis not present

## 2023-06-29 DIAGNOSIS — D631 Anemia in chronic kidney disease: Secondary | ICD-10-CM | POA: Diagnosis not present

## 2023-06-29 DIAGNOSIS — L89153 Pressure ulcer of sacral region, stage 3: Secondary | ICD-10-CM | POA: Diagnosis not present

## 2023-06-29 DIAGNOSIS — S322XXD Fracture of coccyx, subsequent encounter for fracture with routine healing: Secondary | ICD-10-CM | POA: Diagnosis not present

## 2023-06-29 DIAGNOSIS — Z7401 Bed confinement status: Secondary | ICD-10-CM | POA: Diagnosis not present

## 2023-06-29 DIAGNOSIS — J952 Acute pulmonary insufficiency following nonthoracic surgery: Secondary | ICD-10-CM | POA: Diagnosis not present

## 2023-06-29 DIAGNOSIS — Z681 Body mass index (BMI) 19 or less, adult: Secondary | ICD-10-CM | POA: Diagnosis not present

## 2023-06-29 DIAGNOSIS — R41841 Cognitive communication deficit: Secondary | ICD-10-CM | POA: Diagnosis not present

## 2023-06-29 DIAGNOSIS — S81801A Unspecified open wound, right lower leg, initial encounter: Secondary | ICD-10-CM | POA: Diagnosis not present

## 2023-06-29 DIAGNOSIS — R262 Difficulty in walking, not elsewhere classified: Secondary | ICD-10-CM | POA: Diagnosis not present

## 2023-06-29 DIAGNOSIS — S72002D Fracture of unspecified part of neck of left femur, subsequent encounter for closed fracture with routine healing: Secondary | ICD-10-CM | POA: Diagnosis not present

## 2023-06-29 MED ORDER — POLYETHYLENE GLYCOL 3350 17 G PO PACK
17.0000 g | PACK | Freq: Two times a day (BID) | ORAL | Status: DC
  Administered 2023-06-29: 17 g via ORAL
  Filled 2023-06-29: qty 1

## 2023-06-29 MED ORDER — POLYETHYLENE GLYCOL 3350 17 G PO PACK: 17.0000 g | PACK | Freq: Two times a day (BID) | ORAL | Status: DC | PRN

## 2023-06-29 MED ORDER — SENNOSIDES-DOCUSATE SODIUM 8.6-50 MG PO TABS: 1.0000 | ORAL_TABLET | Freq: Two times a day (BID) | ORAL | Status: DC | PRN

## 2023-06-29 MED ORDER — MAGNESIUM CITRATE PO SOLN: 1.0000 | Freq: Every day | ORAL | Status: DC | PRN

## 2023-06-29 MED ORDER — SENNOSIDES-DOCUSATE SODIUM 8.6-50 MG PO TABS
2.0000 | ORAL_TABLET | Freq: Two times a day (BID) | ORAL | Status: DC
Start: 2023-06-29 — End: 2023-06-29
  Administered 2023-06-29: 2 via ORAL
  Filled 2023-06-29: qty 2

## 2023-06-29 NOTE — Discharge Summary (Signed)
 Physician Discharge Summary  Brandi Mccoy FMW:[id number] DOB: 05-12-1922 DOA: 06/24/2023  PCP: Corlis Pagan, NP  Admit date: 06/24/2023 Discharge date: 06/29/2023  Admitted From: Home Disposition: SNF Recommendations for Outpatient Follow-up:  Outpatient follow-up with orthopedic surgery as below Check CBC and CMP in 1 week Please follow up on the following pending results: None  Discharge Condition: Stable CODE STATUS: DNR/DNI  Follow-up Information     Connect with your PCP/Specialist as discussed. Schedule an appointment as soon as possible for a visit .   Contact information: [email or web address] Call our physician referral line at [phone].        Edna Toribio LABOR, MD Follow up.   Specialty: Orthopedic Surgery Contact information: [address] [place] [address] [phone]                 Hospital course 89 year old F with PMH of DM-2, CKD-3B, HTN, HLD, GERD, depression, LBBB, and first-degree AVB presented to ED with left hip pain after accidental ground-level fall, and admitted with left femoral neck fracture.  Patient was walking around the house when her left leg lost control and she fell on the left side.  Denies hitting her head or LOC.  No prodromes.  Left hip x-ray with impacted transcervical left femoral neck fracture.  CT of left hip showed acute transcervical fracture of proximal left femur with mild impaction, age-indeterminate nondisplaced fracture through the proximal segment of the coccyx and bilateral inguinal hernias.  Left knee x-ray without acute finding.  EKG sinus rhythm with first-degree AVB and LBBB unchanged from prior.  Orthopedic surgery consulted.  Echocardiogram ordered.   Echocardiogram without significant finding.  Patient underwent left hip hemiarthroplasty by Dr. Edna on 12/29.  No postop complication.  Therapy recommended SNF.   Outpatient follow-up with orthopedic surgery as  below.  See individual problem list below for more.   Problems addressed during this hospitalization Mechanical fall at home-ground-level fall.  No prodromes.  Did not hit head or lose consciousness. Acute left femoral neck fracture due to fall -Patient independently ambulates using rolling walker at baseline. -S/p left hip hemiarthroplasty by Dr. Edna on 12/29 -Tylenol  and oxycodone  for pain control -Aspirin  81 mg twice daily for 4 weeks postoperatively for VTE prophylaxis -Continue PT/OT at SNF -Outpatient follow-up with orthopedic surgery   CKD-3B: Stable -Recheck CMP in 1 week   Essential hypertension: BP slightly elevated but improved. -Continue amlodipine , hydralazine    Hyperlipidemia -Continue statin-but doubt utility.   Chronic anemia-H&H stable after initial drop.  EBL 150 cc.  Anemia panel with some iron deficiency. -P.o. ferrous sulfate  Bilateral inguinal hernia: Noted on CT.  No incarceration.   Advance care planning: DNR/DNI.  See discussion on 12/29.  Constipation -Bowel regimen-MiraLAX  and Senokot-S   Severe malnutrition: As evidenced by low BMI and significant muscle mass and subcu fat loss. Nutrition Problem: Severe Malnutrition Etiology: chronic illness Signs/Symptoms: severe fat depletion, severe muscle depletion, energy intake < or equal to 75% for > or equal to 1 month Interventions: Refer to RD note for recommendations, Ensure Enlive (each supplement provides 350kcal and 20 grams of protein), Magic cup, MVI     Time spent 35 minutes  Vital signs Vitals:   06/28/23 0558 06/28/23 1356 06/28/23 2104 06/29/23 0511  BP: (!) 145/56 (!) 136/51 (!) 132/50 126/67  Pulse: 66 66 71 71  Temp: 98.1 F (36.7 C) 98.3 F (36.8 C) 98.3 F (36.8 C) 98.1 F (36.7 C)  Resp: 16 17  15 15  Height:      Weight:      SpO2: 97% 96% 96% 97%  TempSrc:  Oral Oral Oral  BMI (Calculated):         Discharge exam  GENERAL: No apparent distress.  Nontoxic.   Appears frail. HEENT: MMM.  Vision grossly intact.  Diminished hearing. NECK: Supple.  No apparent JVD.  RESP:  No IWOB.  Fair aeration bilaterally. CVS:  RRR. Heart sounds normal.  ABD/GI/GU: BS+. Abd soft, NTND.  MSK/EXT:  Moves extremities.  Significant muscle mass and subcu fat loss SKIN: Dressing over surgical site DCI NEURO: Awake and alert. Oriented appropriately.  No apparent focal neuro deficit. PSYCH: Calm. Normal affect.   Discharge Instructions Discharge Instructions     Diet general   Complete by: As directed    Increase activity slowly   Complete by: As directed       Allergies as of 06/29/2023   No Known Allergies      Medication List     STOP taking these medications    dexamethasone 0.5 MG/5ML solution Commonly known as: DECADRON   eszopiclone 2 MG Tabs tablet Commonly known as: LUNESTA   Farxiga 10 MG Tabs tablet Generic drug: dapagliflozin propanediol   Farxiga 5 MG Tabs tablet Generic drug: dapagliflozin propanediol       TAKE these medications    acetaminophen  325 MG tablet Commonly known as: Tylenol  Take 2 tablets (650 mg total) by mouth every 8 (eight) hours for 5 days, THEN 2 tablets (650 mg total) every 6 (six) hours as needed for up to 25 days. Start taking on: June 28, 2023   amLODipine  5 MG tablet Commonly known as: NORVASC  Take 5 mg by mouth daily.   aspirin  EC 81 MG tablet Take 1 tablet (81 mg total) by mouth 2 (two) times daily for 28 days. Swallow whole.   feeding supplement Liqd Take 237 mLs by mouth 2 (two) times daily between meals.   ferrous sulfate 325 (65 FE) MG tablet Take 325 mg by mouth daily.   hydrALAZINE  50 MG tablet Commonly known as: APRESOLINE  Take 1 tablet (50 mg total) by mouth 3 (three) times daily.   melatonin 5 MG Tabs Take 1 tablet (5 mg total) by mouth at bedtime as needed.   mirtazapine 15 MG tablet Commonly known as: REMERON Take 15 mg by mouth at bedtime.   multivitamin with minerals  Tabs tablet Take 1 tablet by mouth daily.   oxyCODONE  5 MG immediate release tablet Commonly known as: Roxicodone  Take 0.5-1 tablets (2.5-5 mg total) by mouth every 4 (four) hours as needed for up to 7 days for severe pain (pain score 7-10) or moderate pain (pain score 4-6).   polyethylene glycol powder 17 GM/SCOOP powder Commonly known as: MiraLax  Take 17 g by mouth 2 (two) times daily as needed for moderate constipation.   pravastatin  10 MG tablet Commonly known as: PRAVACHOL  Take 10 mg by mouth daily.   senna-docusate 8.6-50 MG tablet Commonly known as: Senokot-S Take 1 tablet by mouth 2 (two) times daily between meals as needed for mild constipation.   VITAMIN B-12 PO Take 1 tablet by mouth daily.        Consultations: Orthopedic surgery  Procedures/Studies: 12/29-left hip hemiarthroplasty by Dr. Edna BARE HIP UNILAT W OR W/O PELVIS 2-3 VIEWS LEFT Result Date: 06/25/2023 CLINICAL DATA:  [id number] Post-operative state 252351 EXAM: DG HIP (WITH OR WITHOUT PELVIS) 2-3V LEFT COMPARISON:  06/24/2023  FINDINGS: Interval postsurgical changes from left total hip arthroplasty. Arthroplasty components are in their expected alignment. No periprosthetic fracture or evidence of other complication. Expected postoperative changes within the overlying soft tissues. IMPRESSION: Interval left total hip arthroplasty without evidence of postoperative complication. Electronically Signed   By: Markel Converse D.O.   On: 06/25/2023 16:17   ECHOCARDIOGRAM COMPLETE Result Date: 06/25/2023    ECHOCARDIOGRAM REPORT   Patient Name:   KAENA SANTORI Date of Exam: 06/25/2023 Medical Rec #:  [id number]      Height:       62.0 in Accession #:    [id number]     Weight:       98.0 lb Date of Birth:  [birth year]      BSA:          1.412 m Patient Age:    89 years       BP:           158/89 mmHg Patient Gender: F              HR:           70 bpm. Exam Location:  Inpatient Procedure: 2D Echo, Cardiac  Doppler and Color Doppler STAT ECHO Indications:    Z01.818 Encounter for other preprocedural examination  History:        Patient has prior history of Echocardiogram examinations, most                 recent 02/25/2019. Aortic Valve Disease and Mitral Valve Disease,                 Signs/Symptoms:Dyspnea and Shortness of Breath; Risk                 Factors:Diabetes, Hypertension and Dyslipidemia.  Sonographer:    Ellouise Mose RDCS Referring Phys: [id number] MIGNON DASEN Karolynn Infantino  Sonographer Comments: Suboptimal parasternal window. Patient has extremely thin habitus. Broken hip, supine position. IMPRESSIONS  1. Left ventricular ejection fraction, by estimation, is 60 to 65%. The left ventricle has normal function. The left ventricle has no regional wall motion abnormalities. Left ventricular diastolic parameters are consistent with Grade I diastolic dysfunction (impaired relaxation).  2. Right ventricular systolic function is normal. The right ventricular size is normal. There is mildly elevated pulmonary artery systolic pressure.  3. Severe posterior annular calcification extending sub     chordally.     . The mitral valve is degenerative. Mild mitral valve regurgitation. Mild mitral stenosis. Severe mitral annular calcification.  4. The aortic valve is calcified. Aortic valve regurgitation is not visualized. Mild aortic valve stenosis. FINDINGS  Left Ventricle: Left ventricular ejection fraction, by estimation, is 60 to 65%. The left ventricle has normal function. The left ventricle has no regional wall motion abnormalities. The left ventricular internal cavity size was small. There is no left ventricular hypertrophy. Left ventricular diastolic parameters are consistent with Grade I diastolic dysfunction (impaired relaxation). Right Ventricle: The right ventricular size is normal. No increase in right ventricular wall thickness. Right ventricular systolic function is normal. There is mildly elevated pulmonary artery systolic  pressure. The tricuspid regurgitant velocity is 2.86  m/s, and with an assumed right atrial pressure of 8 mmHg, the estimated right ventricular systolic pressure is 40.7 mmHg. Left Atrium: Left atrial size was normal in size. Right Atrium: Right atrial size was normal in size. Pericardium: There is no evidence of pericardial effusion. Mitral Valve: Severe posterior annular calcification extending sub chordally. The mitral valve  is degenerative in appearance. Severe mitral annular calcification. Mild mitral valve regurgitation. Mild mitral valve stenosis. MV peak gradient, 9.7 mmHg. The mean mitral valve gradient is 4.0 mmHg. Tricuspid Valve: The tricuspid valve is normal in structure. Tricuspid valve regurgitation is mild . No evidence of tricuspid stenosis. Aortic Valve: The aortic valve is calcified. Aortic valve regurgitation is not visualized. Mild aortic stenosis is present. Aortic valve mean gradient measures 10.0 mmHg. Aortic valve peak gradient measures 18.1 mmHg. Aortic valve area, by VTI measures 1.23 cm. Pulmonic Valve: The pulmonic valve was not well visualized. Pulmonic valve regurgitation is not visualized. No evidence of pulmonic stenosis. Aorta: The aortic root and ascending aorta are structurally normal, with no evidence of dilitation. IAS/Shunts: No atrial level shunt detected by color flow Doppler.  LEFT VENTRICLE PLAX 2D LVIDd:         3.90 cm     Diastology LVIDs:         2.90 cm     LV e' medial:    3.81 cm/s LV PW:         1.20 cm     LV E/e' medial:  19.9 LV IVS:        0.90 cm     LV e' lateral:   5.98 cm/s LVOT diam:     1.90 cm     LV E/e' lateral: 12.7 LV SV:         55 LV SV Index:   39 LVOT Area:     2.84 cm  LV Volumes (MOD) LV vol d, MOD A2C: 62.9 ml LV vol d, MOD A4C: 40.6 ml LV vol s, MOD A2C: 25.2 ml LV vol s, MOD A4C: 13.0 ml LV SV MOD A2C:     37.7 ml LV SV MOD A4C:     40.6 ml LV SV MOD BP:      33.2 ml RIGHT VENTRICLE             IVC RV S prime:     11.90 cm/s  IVC diam: 1.40  cm TAPSE (M-mode): 2.1 cm LEFT ATRIUM             Index        RIGHT ATRIUM          Index LA diam:        2.80 cm 1.98 cm/m   RA Area:     8.43 cm LA Vol (A2C):   23.9 ml 16.93 ml/m  RA Volume:   15.30 ml 10.84 ml/m LA Vol (A4C):   20.7 ml 14.66 ml/m LA Biplane Vol: 23.2 ml 16.43 ml/m  AORTIC VALVE AV Area (Vmax):    1.34 cm AV Area (Vmean):   1.27 cm AV Area (VTI):     1.23 cm AV Vmax:           213.00 cm/s AV Vmean:          144.000 cm/s AV VTI:            0.447 m AV Peak Grad:      18.1 mmHg AV Mean Grad:      10.0 mmHg LVOT Vmax:         101.00 cm/s LVOT Vmean:        64.300 cm/s LVOT VTI:          0.194 m LVOT/AV VTI ratio: 0.43  AORTA Ao Root diam: 3.10 cm Ao Asc diam:  3.00 cm MITRAL VALVE  TRICUSPID VALVE MV Area (PHT): 3.12 cm     TR Peak grad:   32.7 mmHg MV Area VTI:   1.65 cm     TR Vmax:        286.00 cm/s MV Peak grad:  9.7 mmHg MV Mean grad:  4.0 mmHg     SHUNTS MV Vmax:       1.56 m/s     Systemic VTI:  0.19 m MV Vmean:      88.1 cm/s    Systemic Diam: 1.90 cm MV Decel Time: 243 msec MV E velocity: 75.80 cm/s MV A velocity: 135.00 cm/s MV E/A ratio:  0.56 Kardie Tobb DO Electronically signed by Dub Huntsman DO Signature Date/Time: 06/25/2023/8:45:24 AM    Final    CT Hip Left Wo Contrast Result Date: 06/24/2023 CLINICAL DATA:  Hip pain, stress fracture suspected, neg xray EXAM: CT OF THE LEFT HIP WITHOUT CONTRAST TECHNIQUE: Multidetector CT imaging of the left hip was performed according to the standard protocol. Multiplanar CT image reconstructions were also generated. RADIATION DOSE REDUCTION: This exam was performed according to the departmental dose-optimization program which includes automated exposure control, adjustment of the mA and/or kV according to patient size and/or use of iterative reconstruction technique. COMPARISON:  X-ray 06/24/2023 FINDINGS: Bones/Joint/Cartilage Acute transcervical fracture of the proximal left femur, mildly impacted. No evidence of  fracture extension into the intertrochanteric region. Hip joint is intact without dislocation. Moderate osteoarthritic changes of the hip joint with chondrocalcinosis. Age indeterminate nondisplaced fracture through the proximal segment of the coccyx (series 7, image 40). The remaining visualized bony pelvis intact. No additional fractures. No lytic or sclerotic bone lesion. Ligaments Suboptimally assessed by CT. Muscles and Tendons No acute musculotendinous abnormality by CT. Tendinopathy of the left hamstring tendon origin and bilateral adductor tendon origins. Soft tissues No fluid collection or hematoma about the hip. Bilateral inguinal hernias on the left containing fat and on the right containing bowel. Colonic diverticulosis. Atherosclerotic vascular calcifications. IMPRESSION: 1. Acute transcervical fracture of the proximal left femur, mildly impacted. 2. Age indeterminate nondisplaced fracture through the proximal segment of the coccyx. 3. Bilateral inguinal hernias on the left containing fat and on the right containing bowel. Electronically Signed   By: Norina Converse D.O.   On: 06/24/2023 17:59   DG Knee 2 Views Left Result Date: 06/24/2023 CLINICAL DATA:  Fall.  Left knee pain. EXAM: LEFT KNEE - 1-2 VIEW COMPARISON:  04/04/2022. FINDINGS: No acute fracture or dislocation. No aggressive osseous lesion. There are degenerative changes of the knee joint in the form of mildly reduced tibio-femoral compartment joint space, tibial spiking and osteophytosis. Note is made of meniscal chondrocalcinosis. No knee effusion or focal soft tissue swelling. No radiopaque foreign bodies. IMPRESSION: *No acute osseous abnormality of the left knee joint. Mild-to-moderate degenerative changes. Electronically Signed   By: Ree Molt M.D.   On: 06/24/2023 15:25   DG HIP UNILAT W OR W/O PELVIS 2-3 VIEWS LEFT Result Date: 06/24/2023 CLINICAL DATA:  Fall. EXAM: DG HIP (WITH OR WITHOUT PELVIS) 2-3V LEFT COMPARISON:   None Available. FINDINGS: There is impacted transcervical fracture of the left femoral neck. No other acute fracture or dislocation. No aggressive osseous lesion. Visualized sacral arcuate lines are unremarkable. Unremarkable symphysis pubis. There are mild degenerative changes of bilateral hip joints without significant joint space narrowing. Osteophytosis of the superior acetabulum. No radiopaque foreign bodies. IMPRESSION: *Impacted transcervical left femoral neck fracture. Electronically Signed   By: Ree Molt HERO.D.  On: 06/24/2023 15:24       The results of significant diagnostics from this hospitalization (including imaging, microbiology, ancillary and laboratory) are listed below for reference.     Microbiology: Recent Results (from the past 240 hours)  Surgical PCR screen     Status: None   Collection Time: 06/25/23  1:59 AM   Specimen: Nasal Mucosa; Nasal Swab  Result Value Ref Range Status   MRSA, PCR NEGATIVE NEGATIVE Final   Staphylococcus aureus NEGATIVE NEGATIVE Final    Comment: (NOTE) The Xpert SA Assay (FDA approved for NASAL specimens in patients 64 years of age and older), is one component of a comprehensive surveillance program. It is not intended to diagnose infection nor to guide or monitor treatment. Performed at [facility], [address], [place], [place] [address]      Labs:  CBC: Recent Labs  Lab 06/24/23 1620 06/25/23 0321 06/26/23 0310 06/27/23 0710  WBC 7.9 6.7 7.2 8.0  NEUTROABS 6.0  --   --   --   HGB 10.7* 9.4* 8.4* 8.5*  HCT 34.6* 30.3* 28.1* 27.2*  MCV 93.3 94.1 95.6 94.1  PLT 228 214 188 192   BMP &GFR Recent Labs  Lab 06/24/23 1558 06/25/23 0321 06/26/23 0310 06/26/23 0311  NA 139 138 135 135  K 4.0 3.9 4.3 4.3  CL 107 105 105 105  CO2 21* 22 21* 22  GLUCOSE 120* 101* 81 83  BUN 25* 28* 25* 25*  CREATININE 1.28* 1.34* 1.31* 1.29*  CALCIUM 9.3 8.9 8.4* 8.4*  MG  --   --  2.2  --   PHOS  --   --    --  3.5   Estimated Creatinine Clearance: 16.7 mL/min (A) (by C-G formula based on SCr of 1.29 mg/dL (H)). Liver & Pancreas: Recent Labs  Lab 06/26/23 0311  ALBUMIN  2.7*   No results for input(s): LIPASE, AMYLASE in the last 168 hours. No results for input(s): AMMONIA in the last 168 hours. Diabetic: No results for input(s): HGBA1C in the last 72 hours. Recent Labs  Lab 06/25/23 1137 06/25/23 1429  GLUCAP 68* 66*   Cardiac Enzymes: No results for input(s): CKTOTAL, CKMB, CKMBINDEX, TROPONINI in the last 168 hours. No results for input(s): PROBNP in the last 8760 hours. Coagulation Profile: Recent Labs  Lab 06/25/23 0321  INR 1.1   Thyroid  Function Tests: No results for input(s): TSH, T4TOTAL, FREET4, T3FREE, THYROIDAB in the last 72 hours. Lipid Profile: No results for input(s): CHOL, HDL, LDLCALC, TRIG, CHOLHDL, LDLDIRECT in the last 72 hours. Anemia Panel: Recent Labs    06/27/23 0710  VITAMINB12 3,848*  FOLATE 7.7  FERRITIN 202  TIBC 151*  IRON 10*  RETICCTPCT 1.2   Urine analysis: No results found for: COLORURINE, APPEARANCEUR, LABSPEC, PHURINE, GLUCOSEU, HGBUR, BILIRUBINUR, KETONESUR, PROTEINUR, UROBILINOGEN, NITRITE, LEUKOCYTESUR Sepsis Labs: Invalid input(s): PROCALCITONIN, LACTICIDVEN   SIGNED:  Priscella Donna T Holden Draughon, MD  [facility] 06/29/2023, 8:39 AM

## 2023-06-29 NOTE — TOC Transition Note (Signed)
 Transition of Care [facility]) - Discharge Note  Patient Details  Name: Brandi Mccoy MRN: [id number] Date of Birth: [birth year]  Transition of Care [facility] CM/SW Contact:  Duwaine GORMAN Aran, LCSW Phone Number: 06/29/2023, 10:29 AM  Clinical Narrative: CSW confirmed with [place] in admissions at [facility] that patient can be admitted today. Patient will go to room 902 and the number for report is [phone]. Discharge summary, discharge orders, and SNF transfer report faxed to facility in hub. Medical necessity form done; PTAR scheduled. Discharge packet completed. CSW updated daughter, Dorothe, regarding transportation. RN updated. TOC signing off.   Final next level of care: Skilled Nursing Facility Barriers to Discharge: Barriers Resolved  Patient Goals and CMS Choice Patient states their goals for this hospitalization and ongoing recovery are:: Go to rehab Choice offered to / list presented to : Adult Children, Patient  Discharge Placement PASRR number recieved: 06/27/23       Patient chooses bed at: [facility] Patient to be transferred to facility by: PTAR Name of family member notified: [name] (daughter) Patient and family notified of of transfer: 06/29/23  Discharge Plan and Services Additional resources added to the After Visit Summary for   In-house Referral: Clinical Social Work Post Acute Care Choice: Skilled Nursing Facility          DME Arranged: N/A DME Agency: NA  Social Drivers of Health (SDOH) Interventions SDOH Screenings   Food Insecurity: No Food Insecurity (06/24/2023)  Housing: Low Risk  (06/24/2023)  Transportation Needs: No Transportation Needs (06/24/2023)  Utilities: Not At Risk (06/24/2023)  Social Connections: Unknown (06/26/2023)  Tobacco Use: Low Risk  (06/25/2023)   Readmission Risk Interventions     No data to display

## 2023-07-03 DIAGNOSIS — N1832 Chronic kidney disease, stage 3b: Secondary | ICD-10-CM | POA: Diagnosis not present

## 2023-07-03 DIAGNOSIS — I129 Hypertensive chronic kidney disease with stage 1 through stage 4 chronic kidney disease, or unspecified chronic kidney disease: Secondary | ICD-10-CM | POA: Diagnosis not present

## 2023-07-03 DIAGNOSIS — E43 Unspecified severe protein-calorie malnutrition: Secondary | ICD-10-CM | POA: Diagnosis not present

## 2023-07-03 DIAGNOSIS — S322XXD Fracture of coccyx, subsequent encounter for fracture with routine healing: Secondary | ICD-10-CM | POA: Diagnosis not present

## 2023-07-03 DIAGNOSIS — R2689 Other abnormalities of gait and mobility: Secondary | ICD-10-CM | POA: Diagnosis not present

## 2023-07-03 DIAGNOSIS — D631 Anemia in chronic kidney disease: Secondary | ICD-10-CM | POA: Diagnosis not present

## 2023-07-03 DIAGNOSIS — S72002D Fracture of unspecified part of neck of left femur, subsequent encounter for closed fracture with routine healing: Secondary | ICD-10-CM | POA: Diagnosis not present

## 2023-07-03 DIAGNOSIS — M6250 Muscle wasting and atrophy, not elsewhere classified, unspecified site: Secondary | ICD-10-CM | POA: Diagnosis not present

## 2023-07-03 DIAGNOSIS — E1169 Type 2 diabetes mellitus with other specified complication: Secondary | ICD-10-CM | POA: Diagnosis not present

## 2023-07-03 DIAGNOSIS — E785 Hyperlipidemia, unspecified: Secondary | ICD-10-CM | POA: Diagnosis not present

## 2023-07-03 DIAGNOSIS — E1122 Type 2 diabetes mellitus with diabetic chronic kidney disease: Secondary | ICD-10-CM | POA: Diagnosis not present

## 2023-07-03 DIAGNOSIS — M129 Arthropathy, unspecified: Secondary | ICD-10-CM | POA: Diagnosis not present

## 2023-07-03 DIAGNOSIS — Z9181 History of falling: Secondary | ICD-10-CM | POA: Diagnosis not present

## 2023-07-03 DIAGNOSIS — K4020 Bilateral inguinal hernia, without obstruction or gangrene, not specified as recurrent: Secondary | ICD-10-CM | POA: Diagnosis not present

## 2023-07-03 DIAGNOSIS — Z681 Body mass index (BMI) 19 or less, adult: Secondary | ICD-10-CM | POA: Diagnosis not present

## 2023-07-04 DIAGNOSIS — L89153 Pressure ulcer of sacral region, stage 3: Secondary | ICD-10-CM | POA: Diagnosis not present

## 2023-07-04 DIAGNOSIS — S81801A Unspecified open wound, right lower leg, initial encounter: Secondary | ICD-10-CM | POA: Diagnosis not present

## 2023-07-04 DIAGNOSIS — S51002A Unspecified open wound of left elbow, initial encounter: Secondary | ICD-10-CM | POA: Diagnosis not present

## 2023-07-06 DIAGNOSIS — N1832 Chronic kidney disease, stage 3b: Secondary | ICD-10-CM | POA: Diagnosis not present

## 2023-07-06 DIAGNOSIS — D631 Anemia in chronic kidney disease: Secondary | ICD-10-CM | POA: Diagnosis not present

## 2023-07-06 DIAGNOSIS — Z7189 Other specified counseling: Secondary | ICD-10-CM | POA: Diagnosis not present

## 2023-07-06 DIAGNOSIS — F32A Depression, unspecified: Secondary | ICD-10-CM | POA: Diagnosis not present

## 2023-07-06 DIAGNOSIS — E1122 Type 2 diabetes mellitus with diabetic chronic kidney disease: Secondary | ICD-10-CM | POA: Diagnosis not present

## 2023-07-06 DIAGNOSIS — M810 Age-related osteoporosis without current pathological fracture: Secondary | ICD-10-CM | POA: Diagnosis not present

## 2023-07-06 DIAGNOSIS — S72002D Fracture of unspecified part of neck of left femur, subsequent encounter for closed fracture with routine healing: Secondary | ICD-10-CM | POA: Diagnosis not present

## 2023-07-06 DIAGNOSIS — M129 Arthropathy, unspecified: Secondary | ICD-10-CM | POA: Diagnosis not present

## 2023-07-06 DIAGNOSIS — K4020 Bilateral inguinal hernia, without obstruction or gangrene, not specified as recurrent: Secondary | ICD-10-CM | POA: Diagnosis not present

## 2023-07-06 DIAGNOSIS — L89159 Pressure ulcer of sacral region, unspecified stage: Secondary | ICD-10-CM | POA: Diagnosis not present

## 2023-07-07 ENCOUNTER — Other Ambulatory Visit: Payer: Self-pay | Admitting: *Deleted

## 2023-07-07 DIAGNOSIS — K4020 Bilateral inguinal hernia, without obstruction or gangrene, not specified as recurrent: Secondary | ICD-10-CM | POA: Diagnosis not present

## 2023-07-07 DIAGNOSIS — L89153 Pressure ulcer of sacral region, stage 3: Secondary | ICD-10-CM | POA: Diagnosis not present

## 2023-07-07 DIAGNOSIS — D631 Anemia in chronic kidney disease: Secondary | ICD-10-CM | POA: Diagnosis not present

## 2023-07-07 DIAGNOSIS — Z7984 Long term (current) use of oral hypoglycemic drugs: Secondary | ICD-10-CM | POA: Diagnosis not present

## 2023-07-07 DIAGNOSIS — S322XXD Fracture of coccyx, subsequent encounter for fracture with routine healing: Secondary | ICD-10-CM | POA: Diagnosis not present

## 2023-07-07 DIAGNOSIS — M810 Age-related osteoporosis without current pathological fracture: Secondary | ICD-10-CM | POA: Diagnosis not present

## 2023-07-07 DIAGNOSIS — E1122 Type 2 diabetes mellitus with diabetic chronic kidney disease: Secondary | ICD-10-CM | POA: Diagnosis not present

## 2023-07-07 DIAGNOSIS — N1832 Chronic kidney disease, stage 3b: Secondary | ICD-10-CM | POA: Diagnosis not present

## 2023-07-07 DIAGNOSIS — S72002D Fracture of unspecified part of neck of left femur, subsequent encounter for closed fracture with routine healing: Secondary | ICD-10-CM | POA: Diagnosis not present

## 2023-07-07 DIAGNOSIS — Z7189 Other specified counseling: Secondary | ICD-10-CM | POA: Diagnosis not present

## 2023-07-07 DIAGNOSIS — M129 Arthropathy, unspecified: Secondary | ICD-10-CM | POA: Diagnosis not present

## 2023-07-07 NOTE — Patient Outreach (Signed)
 Late entry for 07/06/23  Post-Acute Care Manager follow up. Mrs. Binz resides in [facility]. Screening for potential chronic care management services as a benefit of health plan and primary care provider.  Met with social work team at [facility]. Mrs. Stimpson's transition plan is to return home with supportive family. Will have [facility] home health. Went to Mrs. Fleer's room tp speak with family. However, therapy was in the room and daughter was not at bedside at the time. Therapy reported daughter just recently stepped away briefly.  Telephone call made to [name] [phone] to discuss VBCI chronic care management follow up. No answer. HIPAA compliant voicemail message left.   Will continue to follow.   Pablo Hurst, MSN, RN, BSN [place]  [facility], [facility] RN Post- Acute Care Manager Direct Dial: [phone]

## 2023-07-08 DIAGNOSIS — K219 Gastro-esophageal reflux disease without esophagitis: Secondary | ICD-10-CM | POA: Diagnosis not present

## 2023-07-08 DIAGNOSIS — Z7982 Long term (current) use of aspirin: Secondary | ICD-10-CM | POA: Diagnosis not present

## 2023-07-08 DIAGNOSIS — E785 Hyperlipidemia, unspecified: Secondary | ICD-10-CM | POA: Diagnosis not present

## 2023-07-08 DIAGNOSIS — Z96642 Presence of left artificial hip joint: Secondary | ICD-10-CM | POA: Diagnosis not present

## 2023-07-08 DIAGNOSIS — S81801D Unspecified open wound, right lower leg, subsequent encounter: Secondary | ICD-10-CM | POA: Diagnosis not present

## 2023-07-08 DIAGNOSIS — R32 Unspecified urinary incontinence: Secondary | ICD-10-CM | POA: Diagnosis not present

## 2023-07-08 DIAGNOSIS — Z860100 Personal history of colon polyps, unspecified: Secondary | ICD-10-CM | POA: Diagnosis not present

## 2023-07-08 DIAGNOSIS — K5900 Constipation, unspecified: Secondary | ICD-10-CM | POA: Diagnosis not present

## 2023-07-08 DIAGNOSIS — G4700 Insomnia, unspecified: Secondary | ICD-10-CM | POA: Diagnosis not present

## 2023-07-08 DIAGNOSIS — I129 Hypertensive chronic kidney disease with stage 1 through stage 4 chronic kidney disease, or unspecified chronic kidney disease: Secondary | ICD-10-CM | POA: Diagnosis not present

## 2023-07-08 DIAGNOSIS — M80052D Age-related osteoporosis with current pathological fracture, left femur, subsequent encounter for fracture with routine healing: Secondary | ICD-10-CM | POA: Diagnosis not present

## 2023-07-08 DIAGNOSIS — Z9181 History of falling: Secondary | ICD-10-CM | POA: Diagnosis not present

## 2023-07-08 DIAGNOSIS — E1122 Type 2 diabetes mellitus with diabetic chronic kidney disease: Secondary | ICD-10-CM | POA: Diagnosis not present

## 2023-07-08 DIAGNOSIS — N1832 Chronic kidney disease, stage 3b: Secondary | ICD-10-CM | POA: Diagnosis not present

## 2023-07-08 DIAGNOSIS — Z681 Body mass index (BMI) 19 or less, adult: Secondary | ICD-10-CM | POA: Diagnosis not present

## 2023-07-08 DIAGNOSIS — E43 Unspecified severe protein-calorie malnutrition: Secondary | ICD-10-CM | POA: Diagnosis not present

## 2023-07-08 DIAGNOSIS — M4692 Unspecified inflammatory spondylopathy, cervical region: Secondary | ICD-10-CM | POA: Diagnosis not present

## 2023-07-08 DIAGNOSIS — M5030 Other cervical disc degeneration, unspecified cervical region: Secondary | ICD-10-CM | POA: Diagnosis not present

## 2023-07-08 DIAGNOSIS — K5730 Diverticulosis of large intestine without perforation or abscess without bleeding: Secondary | ICD-10-CM | POA: Diagnosis not present

## 2023-07-08 DIAGNOSIS — D631 Anemia in chronic kidney disease: Secondary | ICD-10-CM | POA: Diagnosis not present

## 2023-07-08 DIAGNOSIS — F32A Depression, unspecified: Secondary | ICD-10-CM | POA: Diagnosis not present

## 2023-07-08 DIAGNOSIS — L89150 Pressure ulcer of sacral region, unstageable: Secondary | ICD-10-CM | POA: Diagnosis not present

## 2023-07-08 DIAGNOSIS — K4020 Bilateral inguinal hernia, without obstruction or gangrene, not specified as recurrent: Secondary | ICD-10-CM | POA: Diagnosis not present

## 2023-07-08 DIAGNOSIS — S72042D Displaced fracture of base of neck of left femur, subsequent encounter for closed fracture with routine healing: Secondary | ICD-10-CM | POA: Diagnosis not present

## 2023-07-08 DIAGNOSIS — M47812 Spondylosis without myelopathy or radiculopathy, cervical region: Secondary | ICD-10-CM | POA: Diagnosis not present

## 2023-07-10 DIAGNOSIS — E43 Unspecified severe protein-calorie malnutrition: Secondary | ICD-10-CM | POA: Diagnosis not present

## 2023-07-10 DIAGNOSIS — E1122 Type 2 diabetes mellitus with diabetic chronic kidney disease: Secondary | ICD-10-CM | POA: Diagnosis not present

## 2023-07-10 DIAGNOSIS — N1832 Chronic kidney disease, stage 3b: Secondary | ICD-10-CM | POA: Diagnosis not present

## 2023-07-10 DIAGNOSIS — M80052D Age-related osteoporosis with current pathological fracture, left femur, subsequent encounter for fracture with routine healing: Secondary | ICD-10-CM | POA: Diagnosis not present

## 2023-07-10 DIAGNOSIS — D631 Anemia in chronic kidney disease: Secondary | ICD-10-CM | POA: Diagnosis not present

## 2023-07-10 DIAGNOSIS — I129 Hypertensive chronic kidney disease with stage 1 through stage 4 chronic kidney disease, or unspecified chronic kidney disease: Secondary | ICD-10-CM | POA: Diagnosis not present

## 2023-07-10 NOTE — Patient Outreach (Signed)
 Post-Acute Care Manager follow up. Telephone call received from daughter Harland. Myra reports  Mrs. Dohse came home today, Friday, January 10th instead of waiting. Mrs. Bradeen lives with Myra. Daughter Dorothe lives out of town.   Discussed VBCI chronic care management services. Myra states she wants to see how things go first. States she is expecting home health to come out. Will have [facility] for home health services. Myra reports she manages Mrs. Lazaro's medications and the like. Has ortho appt scheduled. States she will also schedule PCP appointment.   Myra states she has writer's number to call if care management services are needed in the future.   Pablo Hurst, MSN, RN, BSN [place]  [facility], [facility] RN Post- Acute Care Manager Direct Dial: [phone]

## 2023-07-11 DIAGNOSIS — S72042D Displaced fracture of base of neck of left femur, subsequent encounter for closed fracture with routine healing: Secondary | ICD-10-CM | POA: Diagnosis not present

## 2023-07-12 DIAGNOSIS — E43 Unspecified severe protein-calorie malnutrition: Secondary | ICD-10-CM | POA: Diagnosis not present

## 2023-07-12 DIAGNOSIS — N1832 Chronic kidney disease, stage 3b: Secondary | ICD-10-CM | POA: Diagnosis not present

## 2023-07-12 DIAGNOSIS — D631 Anemia in chronic kidney disease: Secondary | ICD-10-CM | POA: Diagnosis not present

## 2023-07-12 DIAGNOSIS — I129 Hypertensive chronic kidney disease with stage 1 through stage 4 chronic kidney disease, or unspecified chronic kidney disease: Secondary | ICD-10-CM | POA: Diagnosis not present

## 2023-07-12 DIAGNOSIS — M80052D Age-related osteoporosis with current pathological fracture, left femur, subsequent encounter for fracture with routine healing: Secondary | ICD-10-CM | POA: Diagnosis not present

## 2023-07-12 DIAGNOSIS — E1122 Type 2 diabetes mellitus with diabetic chronic kidney disease: Secondary | ICD-10-CM | POA: Diagnosis not present

## 2023-07-13 DIAGNOSIS — E43 Unspecified severe protein-calorie malnutrition: Secondary | ICD-10-CM | POA: Diagnosis not present

## 2023-07-13 DIAGNOSIS — M80052D Age-related osteoporosis with current pathological fracture, left femur, subsequent encounter for fracture with routine healing: Secondary | ICD-10-CM | POA: Diagnosis not present

## 2023-07-13 DIAGNOSIS — I129 Hypertensive chronic kidney disease with stage 1 through stage 4 chronic kidney disease, or unspecified chronic kidney disease: Secondary | ICD-10-CM | POA: Diagnosis not present

## 2023-07-13 DIAGNOSIS — E1122 Type 2 diabetes mellitus with diabetic chronic kidney disease: Secondary | ICD-10-CM | POA: Diagnosis not present

## 2023-07-13 DIAGNOSIS — N1832 Chronic kidney disease, stage 3b: Secondary | ICD-10-CM | POA: Diagnosis not present

## 2023-07-13 DIAGNOSIS — D631 Anemia in chronic kidney disease: Secondary | ICD-10-CM | POA: Diagnosis not present

## 2023-07-18 DIAGNOSIS — M80052D Age-related osteoporosis with current pathological fracture, left femur, subsequent encounter for fracture with routine healing: Secondary | ICD-10-CM | POA: Diagnosis not present

## 2023-07-18 DIAGNOSIS — E1122 Type 2 diabetes mellitus with diabetic chronic kidney disease: Secondary | ICD-10-CM | POA: Diagnosis not present

## 2023-07-18 DIAGNOSIS — D631 Anemia in chronic kidney disease: Secondary | ICD-10-CM | POA: Diagnosis not present

## 2023-07-18 DIAGNOSIS — I129 Hypertensive chronic kidney disease with stage 1 through stage 4 chronic kidney disease, or unspecified chronic kidney disease: Secondary | ICD-10-CM | POA: Diagnosis not present

## 2023-07-18 DIAGNOSIS — E43 Unspecified severe protein-calorie malnutrition: Secondary | ICD-10-CM | POA: Diagnosis not present

## 2023-07-18 DIAGNOSIS — N1832 Chronic kidney disease, stage 3b: Secondary | ICD-10-CM | POA: Diagnosis not present

## 2023-07-19 DIAGNOSIS — E43 Unspecified severe protein-calorie malnutrition: Secondary | ICD-10-CM | POA: Diagnosis not present

## 2023-07-19 DIAGNOSIS — I129 Hypertensive chronic kidney disease with stage 1 through stage 4 chronic kidney disease, or unspecified chronic kidney disease: Secondary | ICD-10-CM | POA: Diagnosis not present

## 2023-07-19 DIAGNOSIS — D631 Anemia in chronic kidney disease: Secondary | ICD-10-CM | POA: Diagnosis not present

## 2023-07-19 DIAGNOSIS — M80052D Age-related osteoporosis with current pathological fracture, left femur, subsequent encounter for fracture with routine healing: Secondary | ICD-10-CM | POA: Diagnosis not present

## 2023-07-19 DIAGNOSIS — N1832 Chronic kidney disease, stage 3b: Secondary | ICD-10-CM | POA: Diagnosis not present

## 2023-07-19 DIAGNOSIS — E1122 Type 2 diabetes mellitus with diabetic chronic kidney disease: Secondary | ICD-10-CM | POA: Diagnosis not present

## 2023-07-21 DIAGNOSIS — E1122 Type 2 diabetes mellitus with diabetic chronic kidney disease: Secondary | ICD-10-CM | POA: Diagnosis not present

## 2023-07-21 DIAGNOSIS — M80052D Age-related osteoporosis with current pathological fracture, left femur, subsequent encounter for fracture with routine healing: Secondary | ICD-10-CM | POA: Diagnosis not present

## 2023-07-21 DIAGNOSIS — N1832 Chronic kidney disease, stage 3b: Secondary | ICD-10-CM | POA: Diagnosis not present

## 2023-07-21 DIAGNOSIS — I129 Hypertensive chronic kidney disease with stage 1 through stage 4 chronic kidney disease, or unspecified chronic kidney disease: Secondary | ICD-10-CM | POA: Diagnosis not present

## 2023-07-21 DIAGNOSIS — D631 Anemia in chronic kidney disease: Secondary | ICD-10-CM | POA: Diagnosis not present

## 2023-07-21 DIAGNOSIS — E43 Unspecified severe protein-calorie malnutrition: Secondary | ICD-10-CM | POA: Diagnosis not present

## 2023-07-24 DIAGNOSIS — E43 Unspecified severe protein-calorie malnutrition: Secondary | ICD-10-CM | POA: Diagnosis not present

## 2023-07-24 DIAGNOSIS — D631 Anemia in chronic kidney disease: Secondary | ICD-10-CM | POA: Diagnosis not present

## 2023-07-24 DIAGNOSIS — N1832 Chronic kidney disease, stage 3b: Secondary | ICD-10-CM | POA: Diagnosis not present

## 2023-07-24 DIAGNOSIS — I129 Hypertensive chronic kidney disease with stage 1 through stage 4 chronic kidney disease, or unspecified chronic kidney disease: Secondary | ICD-10-CM | POA: Diagnosis not present

## 2023-07-24 DIAGNOSIS — M80052D Age-related osteoporosis with current pathological fracture, left femur, subsequent encounter for fracture with routine healing: Secondary | ICD-10-CM | POA: Diagnosis not present

## 2023-07-24 DIAGNOSIS — E1122 Type 2 diabetes mellitus with diabetic chronic kidney disease: Secondary | ICD-10-CM | POA: Diagnosis not present

## 2023-07-26 DIAGNOSIS — I129 Hypertensive chronic kidney disease with stage 1 through stage 4 chronic kidney disease, or unspecified chronic kidney disease: Secondary | ICD-10-CM | POA: Diagnosis not present

## 2023-07-26 DIAGNOSIS — E43 Unspecified severe protein-calorie malnutrition: Secondary | ICD-10-CM | POA: Diagnosis not present

## 2023-07-26 DIAGNOSIS — D631 Anemia in chronic kidney disease: Secondary | ICD-10-CM | POA: Diagnosis not present

## 2023-07-26 DIAGNOSIS — M80052D Age-related osteoporosis with current pathological fracture, left femur, subsequent encounter for fracture with routine healing: Secondary | ICD-10-CM | POA: Diagnosis not present

## 2023-07-26 DIAGNOSIS — E1122 Type 2 diabetes mellitus with diabetic chronic kidney disease: Secondary | ICD-10-CM | POA: Diagnosis not present

## 2023-07-26 DIAGNOSIS — N1832 Chronic kidney disease, stage 3b: Secondary | ICD-10-CM | POA: Diagnosis not present

## 2023-07-28 DIAGNOSIS — D631 Anemia in chronic kidney disease: Secondary | ICD-10-CM | POA: Diagnosis not present

## 2023-07-28 DIAGNOSIS — E1122 Type 2 diabetes mellitus with diabetic chronic kidney disease: Secondary | ICD-10-CM | POA: Diagnosis not present

## 2023-07-28 DIAGNOSIS — M80052D Age-related osteoporosis with current pathological fracture, left femur, subsequent encounter for fracture with routine healing: Secondary | ICD-10-CM | POA: Diagnosis not present

## 2023-07-28 DIAGNOSIS — N1832 Chronic kidney disease, stage 3b: Secondary | ICD-10-CM | POA: Diagnosis not present

## 2023-07-28 DIAGNOSIS — E43 Unspecified severe protein-calorie malnutrition: Secondary | ICD-10-CM | POA: Diagnosis not present

## 2023-07-28 DIAGNOSIS — I129 Hypertensive chronic kidney disease with stage 1 through stage 4 chronic kidney disease, or unspecified chronic kidney disease: Secondary | ICD-10-CM | POA: Diagnosis not present

## 2023-08-01 DIAGNOSIS — E1122 Type 2 diabetes mellitus with diabetic chronic kidney disease: Secondary | ICD-10-CM | POA: Diagnosis not present

## 2023-08-01 DIAGNOSIS — E43 Unspecified severe protein-calorie malnutrition: Secondary | ICD-10-CM | POA: Diagnosis not present

## 2023-08-01 DIAGNOSIS — D631 Anemia in chronic kidney disease: Secondary | ICD-10-CM | POA: Diagnosis not present

## 2023-08-01 DIAGNOSIS — N1832 Chronic kidney disease, stage 3b: Secondary | ICD-10-CM | POA: Diagnosis not present

## 2023-08-01 DIAGNOSIS — M80052D Age-related osteoporosis with current pathological fracture, left femur, subsequent encounter for fracture with routine healing: Secondary | ICD-10-CM | POA: Diagnosis not present

## 2023-08-01 DIAGNOSIS — I129 Hypertensive chronic kidney disease with stage 1 through stage 4 chronic kidney disease, or unspecified chronic kidney disease: Secondary | ICD-10-CM | POA: Diagnosis not present

## 2023-08-02 ENCOUNTER — Ambulatory Visit (HOSPITAL_BASED_OUTPATIENT_CLINIC_OR_DEPARTMENT_OTHER): Payer: Medicare Other | Admitting: General Surgery

## 2023-08-02 DIAGNOSIS — I5032 Chronic diastolic (congestive) heart failure: Secondary | ICD-10-CM | POA: Diagnosis not present

## 2023-08-02 DIAGNOSIS — I110 Hypertensive heart disease with heart failure: Secondary | ICD-10-CM | POA: Diagnosis not present

## 2023-08-02 DIAGNOSIS — K219 Gastro-esophageal reflux disease without esophagitis: Secondary | ICD-10-CM | POA: Diagnosis not present

## 2023-08-02 DIAGNOSIS — Z66 Do not resuscitate: Secondary | ICD-10-CM | POA: Diagnosis not present

## 2023-08-02 DIAGNOSIS — I447 Left bundle-branch block, unspecified: Secondary | ICD-10-CM | POA: Diagnosis not present

## 2023-08-02 DIAGNOSIS — I440 Atrioventricular block, first degree: Secondary | ICD-10-CM | POA: Diagnosis not present

## 2023-08-02 DIAGNOSIS — F32A Depression, unspecified: Secondary | ICD-10-CM | POA: Diagnosis not present

## 2023-08-02 DIAGNOSIS — Z515 Encounter for palliative care: Secondary | ICD-10-CM | POA: Diagnosis not present

## 2023-08-02 DIAGNOSIS — E118 Type 2 diabetes mellitus with unspecified complications: Secondary | ICD-10-CM | POA: Diagnosis not present

## 2023-08-04 DIAGNOSIS — M80052D Age-related osteoporosis with current pathological fracture, left femur, subsequent encounter for fracture with routine healing: Secondary | ICD-10-CM | POA: Diagnosis not present

## 2023-08-04 DIAGNOSIS — E43 Unspecified severe protein-calorie malnutrition: Secondary | ICD-10-CM | POA: Diagnosis not present

## 2023-08-04 DIAGNOSIS — I129 Hypertensive chronic kidney disease with stage 1 through stage 4 chronic kidney disease, or unspecified chronic kidney disease: Secondary | ICD-10-CM | POA: Diagnosis not present

## 2023-08-04 DIAGNOSIS — E1122 Type 2 diabetes mellitus with diabetic chronic kidney disease: Secondary | ICD-10-CM | POA: Diagnosis not present

## 2023-08-04 DIAGNOSIS — N1832 Chronic kidney disease, stage 3b: Secondary | ICD-10-CM | POA: Diagnosis not present

## 2023-08-04 DIAGNOSIS — D631 Anemia in chronic kidney disease: Secondary | ICD-10-CM | POA: Diagnosis not present

## 2023-08-05 DIAGNOSIS — K219 Gastro-esophageal reflux disease without esophagitis: Secondary | ICD-10-CM | POA: Diagnosis not present

## 2023-08-05 DIAGNOSIS — I447 Left bundle-branch block, unspecified: Secondary | ICD-10-CM | POA: Diagnosis not present

## 2023-08-05 DIAGNOSIS — F32A Depression, unspecified: Secondary | ICD-10-CM | POA: Diagnosis not present

## 2023-08-05 DIAGNOSIS — I110 Hypertensive heart disease with heart failure: Secondary | ICD-10-CM | POA: Diagnosis not present

## 2023-08-05 DIAGNOSIS — E118 Type 2 diabetes mellitus with unspecified complications: Secondary | ICD-10-CM | POA: Diagnosis not present

## 2023-08-05 DIAGNOSIS — I5032 Chronic diastolic (congestive) heart failure: Secondary | ICD-10-CM | POA: Diagnosis not present

## 2023-08-06 DIAGNOSIS — F32A Depression, unspecified: Secondary | ICD-10-CM | POA: Diagnosis not present

## 2023-08-06 DIAGNOSIS — K219 Gastro-esophageal reflux disease without esophagitis: Secondary | ICD-10-CM | POA: Diagnosis not present

## 2023-08-06 DIAGNOSIS — I110 Hypertensive heart disease with heart failure: Secondary | ICD-10-CM | POA: Diagnosis not present

## 2023-08-06 DIAGNOSIS — E118 Type 2 diabetes mellitus with unspecified complications: Secondary | ICD-10-CM | POA: Diagnosis not present

## 2023-08-06 DIAGNOSIS — I447 Left bundle-branch block, unspecified: Secondary | ICD-10-CM | POA: Diagnosis not present

## 2023-08-06 DIAGNOSIS — I5032 Chronic diastolic (congestive) heart failure: Secondary | ICD-10-CM | POA: Diagnosis not present

## 2023-08-07 DIAGNOSIS — Z860100 Personal history of colon polyps, unspecified: Secondary | ICD-10-CM | POA: Diagnosis not present

## 2023-08-07 DIAGNOSIS — G4700 Insomnia, unspecified: Secondary | ICD-10-CM | POA: Diagnosis not present

## 2023-08-07 DIAGNOSIS — Z96642 Presence of left artificial hip joint: Secondary | ICD-10-CM | POA: Diagnosis not present

## 2023-08-07 DIAGNOSIS — L89150 Pressure ulcer of sacral region, unstageable: Secondary | ICD-10-CM | POA: Diagnosis not present

## 2023-08-07 DIAGNOSIS — K5730 Diverticulosis of large intestine without perforation or abscess without bleeding: Secondary | ICD-10-CM | POA: Diagnosis not present

## 2023-08-07 DIAGNOSIS — M5030 Other cervical disc degeneration, unspecified cervical region: Secondary | ICD-10-CM | POA: Diagnosis not present

## 2023-08-07 DIAGNOSIS — S72042D Displaced fracture of base of neck of left femur, subsequent encounter for closed fracture with routine healing: Secondary | ICD-10-CM | POA: Diagnosis not present

## 2023-08-07 DIAGNOSIS — K219 Gastro-esophageal reflux disease without esophagitis: Secondary | ICD-10-CM | POA: Diagnosis not present

## 2023-08-07 DIAGNOSIS — Z9181 History of falling: Secondary | ICD-10-CM | POA: Diagnosis not present

## 2023-08-07 DIAGNOSIS — K5900 Constipation, unspecified: Secondary | ICD-10-CM | POA: Diagnosis not present

## 2023-08-07 DIAGNOSIS — D631 Anemia in chronic kidney disease: Secondary | ICD-10-CM | POA: Diagnosis not present

## 2023-08-07 DIAGNOSIS — M47812 Spondylosis without myelopathy or radiculopathy, cervical region: Secondary | ICD-10-CM | POA: Diagnosis not present

## 2023-08-07 DIAGNOSIS — Z7982 Long term (current) use of aspirin: Secondary | ICD-10-CM | POA: Diagnosis not present

## 2023-08-07 DIAGNOSIS — E43 Unspecified severe protein-calorie malnutrition: Secondary | ICD-10-CM | POA: Diagnosis not present

## 2023-08-07 DIAGNOSIS — F32A Depression, unspecified: Secondary | ICD-10-CM | POA: Diagnosis not present

## 2023-08-07 DIAGNOSIS — K4020 Bilateral inguinal hernia, without obstruction or gangrene, not specified as recurrent: Secondary | ICD-10-CM | POA: Diagnosis not present

## 2023-08-07 DIAGNOSIS — I129 Hypertensive chronic kidney disease with stage 1 through stage 4 chronic kidney disease, or unspecified chronic kidney disease: Secondary | ICD-10-CM | POA: Diagnosis not present

## 2023-08-07 DIAGNOSIS — M4692 Unspecified inflammatory spondylopathy, cervical region: Secondary | ICD-10-CM | POA: Diagnosis not present

## 2023-08-07 DIAGNOSIS — M80052D Age-related osteoporosis with current pathological fracture, left femur, subsequent encounter for fracture with routine healing: Secondary | ICD-10-CM | POA: Diagnosis not present

## 2023-08-07 DIAGNOSIS — E785 Hyperlipidemia, unspecified: Secondary | ICD-10-CM | POA: Diagnosis not present

## 2023-08-07 DIAGNOSIS — N1832 Chronic kidney disease, stage 3b: Secondary | ICD-10-CM | POA: Diagnosis not present

## 2023-08-07 DIAGNOSIS — R32 Unspecified urinary incontinence: Secondary | ICD-10-CM | POA: Diagnosis not present

## 2023-08-07 DIAGNOSIS — Z681 Body mass index (BMI) 19 or less, adult: Secondary | ICD-10-CM | POA: Diagnosis not present

## 2023-08-07 DIAGNOSIS — S81801D Unspecified open wound, right lower leg, subsequent encounter: Secondary | ICD-10-CM | POA: Diagnosis not present

## 2023-08-07 DIAGNOSIS — E1122 Type 2 diabetes mellitus with diabetic chronic kidney disease: Secondary | ICD-10-CM | POA: Diagnosis not present

## 2023-08-08 DIAGNOSIS — F32A Depression, unspecified: Secondary | ICD-10-CM | POA: Diagnosis not present

## 2023-08-08 DIAGNOSIS — E118 Type 2 diabetes mellitus with unspecified complications: Secondary | ICD-10-CM | POA: Diagnosis not present

## 2023-08-08 DIAGNOSIS — I447 Left bundle-branch block, unspecified: Secondary | ICD-10-CM | POA: Diagnosis not present

## 2023-08-08 DIAGNOSIS — K219 Gastro-esophageal reflux disease without esophagitis: Secondary | ICD-10-CM | POA: Diagnosis not present

## 2023-08-08 DIAGNOSIS — I5032 Chronic diastolic (congestive) heart failure: Secondary | ICD-10-CM | POA: Diagnosis not present

## 2023-08-08 DIAGNOSIS — I110 Hypertensive heart disease with heart failure: Secondary | ICD-10-CM | POA: Diagnosis not present

## 2023-08-09 DIAGNOSIS — I447 Left bundle-branch block, unspecified: Secondary | ICD-10-CM | POA: Diagnosis not present

## 2023-08-09 DIAGNOSIS — E118 Type 2 diabetes mellitus with unspecified complications: Secondary | ICD-10-CM | POA: Diagnosis not present

## 2023-08-09 DIAGNOSIS — I5032 Chronic diastolic (congestive) heart failure: Secondary | ICD-10-CM | POA: Diagnosis not present

## 2023-08-09 DIAGNOSIS — I110 Hypertensive heart disease with heart failure: Secondary | ICD-10-CM | POA: Diagnosis not present

## 2023-08-09 DIAGNOSIS — K219 Gastro-esophageal reflux disease without esophagitis: Secondary | ICD-10-CM | POA: Diagnosis not present

## 2023-08-09 DIAGNOSIS — F32A Depression, unspecified: Secondary | ICD-10-CM | POA: Diagnosis not present

## 2023-08-11 DIAGNOSIS — E118 Type 2 diabetes mellitus with unspecified complications: Secondary | ICD-10-CM | POA: Diagnosis not present

## 2023-08-11 DIAGNOSIS — K219 Gastro-esophageal reflux disease without esophagitis: Secondary | ICD-10-CM | POA: Diagnosis not present

## 2023-08-11 DIAGNOSIS — I110 Hypertensive heart disease with heart failure: Secondary | ICD-10-CM | POA: Diagnosis not present

## 2023-08-11 DIAGNOSIS — F32A Depression, unspecified: Secondary | ICD-10-CM | POA: Diagnosis not present

## 2023-08-11 DIAGNOSIS — I5032 Chronic diastolic (congestive) heart failure: Secondary | ICD-10-CM | POA: Diagnosis not present

## 2023-08-11 DIAGNOSIS — I447 Left bundle-branch block, unspecified: Secondary | ICD-10-CM | POA: Diagnosis not present

## 2023-08-15 DIAGNOSIS — I447 Left bundle-branch block, unspecified: Secondary | ICD-10-CM | POA: Diagnosis not present

## 2023-08-15 DIAGNOSIS — F32A Depression, unspecified: Secondary | ICD-10-CM | POA: Diagnosis not present

## 2023-08-15 DIAGNOSIS — K219 Gastro-esophageal reflux disease without esophagitis: Secondary | ICD-10-CM | POA: Diagnosis not present

## 2023-08-15 DIAGNOSIS — E118 Type 2 diabetes mellitus with unspecified complications: Secondary | ICD-10-CM | POA: Diagnosis not present

## 2023-08-15 DIAGNOSIS — I5032 Chronic diastolic (congestive) heart failure: Secondary | ICD-10-CM | POA: Diagnosis not present

## 2023-08-15 DIAGNOSIS — I110 Hypertensive heart disease with heart failure: Secondary | ICD-10-CM | POA: Diagnosis not present

## 2023-08-17 DIAGNOSIS — I447 Left bundle-branch block, unspecified: Secondary | ICD-10-CM | POA: Diagnosis not present

## 2023-08-17 DIAGNOSIS — F32A Depression, unspecified: Secondary | ICD-10-CM | POA: Diagnosis not present

## 2023-08-17 DIAGNOSIS — I110 Hypertensive heart disease with heart failure: Secondary | ICD-10-CM | POA: Diagnosis not present

## 2023-08-17 DIAGNOSIS — K219 Gastro-esophageal reflux disease without esophagitis: Secondary | ICD-10-CM | POA: Diagnosis not present

## 2023-08-17 DIAGNOSIS — E118 Type 2 diabetes mellitus with unspecified complications: Secondary | ICD-10-CM | POA: Diagnosis not present

## 2023-08-17 DIAGNOSIS — I5032 Chronic diastolic (congestive) heart failure: Secondary | ICD-10-CM | POA: Diagnosis not present

## 2023-08-18 DIAGNOSIS — F32A Depression, unspecified: Secondary | ICD-10-CM | POA: Diagnosis not present

## 2023-08-18 DIAGNOSIS — K219 Gastro-esophageal reflux disease without esophagitis: Secondary | ICD-10-CM | POA: Diagnosis not present

## 2023-08-18 DIAGNOSIS — E118 Type 2 diabetes mellitus with unspecified complications: Secondary | ICD-10-CM | POA: Diagnosis not present

## 2023-08-18 DIAGNOSIS — I5032 Chronic diastolic (congestive) heart failure: Secondary | ICD-10-CM | POA: Diagnosis not present

## 2023-08-18 DIAGNOSIS — I110 Hypertensive heart disease with heart failure: Secondary | ICD-10-CM | POA: Diagnosis not present

## 2023-08-18 DIAGNOSIS — I447 Left bundle-branch block, unspecified: Secondary | ICD-10-CM | POA: Diagnosis not present

## 2023-08-21 DIAGNOSIS — E118 Type 2 diabetes mellitus with unspecified complications: Secondary | ICD-10-CM | POA: Diagnosis not present

## 2023-08-21 DIAGNOSIS — I447 Left bundle-branch block, unspecified: Secondary | ICD-10-CM | POA: Diagnosis not present

## 2023-08-21 DIAGNOSIS — F32A Depression, unspecified: Secondary | ICD-10-CM | POA: Diagnosis not present

## 2023-08-21 DIAGNOSIS — I110 Hypertensive heart disease with heart failure: Secondary | ICD-10-CM | POA: Diagnosis not present

## 2023-08-21 DIAGNOSIS — I5032 Chronic diastolic (congestive) heart failure: Secondary | ICD-10-CM | POA: Diagnosis not present

## 2023-08-21 DIAGNOSIS — K219 Gastro-esophageal reflux disease without esophagitis: Secondary | ICD-10-CM | POA: Diagnosis not present

## 2023-08-22 DIAGNOSIS — K219 Gastro-esophageal reflux disease without esophagitis: Secondary | ICD-10-CM | POA: Diagnosis not present

## 2023-08-22 DIAGNOSIS — I110 Hypertensive heart disease with heart failure: Secondary | ICD-10-CM | POA: Diagnosis not present

## 2023-08-22 DIAGNOSIS — I447 Left bundle-branch block, unspecified: Secondary | ICD-10-CM | POA: Diagnosis not present

## 2023-08-22 DIAGNOSIS — E118 Type 2 diabetes mellitus with unspecified complications: Secondary | ICD-10-CM | POA: Diagnosis not present

## 2023-08-22 DIAGNOSIS — F32A Depression, unspecified: Secondary | ICD-10-CM | POA: Diagnosis not present

## 2023-08-22 DIAGNOSIS — I5032 Chronic diastolic (congestive) heart failure: Secondary | ICD-10-CM | POA: Diagnosis not present

## 2023-08-23 DIAGNOSIS — F32A Depression, unspecified: Secondary | ICD-10-CM | POA: Diagnosis not present

## 2023-08-23 DIAGNOSIS — K219 Gastro-esophageal reflux disease without esophagitis: Secondary | ICD-10-CM | POA: Diagnosis not present

## 2023-08-23 DIAGNOSIS — I5032 Chronic diastolic (congestive) heart failure: Secondary | ICD-10-CM | POA: Diagnosis not present

## 2023-08-23 DIAGNOSIS — I447 Left bundle-branch block, unspecified: Secondary | ICD-10-CM | POA: Diagnosis not present

## 2023-08-23 DIAGNOSIS — I110 Hypertensive heart disease with heart failure: Secondary | ICD-10-CM | POA: Diagnosis not present

## 2023-08-23 DIAGNOSIS — E118 Type 2 diabetes mellitus with unspecified complications: Secondary | ICD-10-CM | POA: Diagnosis not present

## 2023-08-24 DIAGNOSIS — I110 Hypertensive heart disease with heart failure: Secondary | ICD-10-CM | POA: Diagnosis not present

## 2023-08-24 DIAGNOSIS — F32A Depression, unspecified: Secondary | ICD-10-CM | POA: Diagnosis not present

## 2023-08-24 DIAGNOSIS — I447 Left bundle-branch block, unspecified: Secondary | ICD-10-CM | POA: Diagnosis not present

## 2023-08-24 DIAGNOSIS — I5032 Chronic diastolic (congestive) heart failure: Secondary | ICD-10-CM | POA: Diagnosis not present

## 2023-08-24 DIAGNOSIS — K219 Gastro-esophageal reflux disease without esophagitis: Secondary | ICD-10-CM | POA: Diagnosis not present

## 2023-08-24 DIAGNOSIS — E118 Type 2 diabetes mellitus with unspecified complications: Secondary | ICD-10-CM | POA: Diagnosis not present

## 2023-08-25 DIAGNOSIS — I110 Hypertensive heart disease with heart failure: Secondary | ICD-10-CM | POA: Diagnosis not present

## 2023-08-25 DIAGNOSIS — K219 Gastro-esophageal reflux disease without esophagitis: Secondary | ICD-10-CM | POA: Diagnosis not present

## 2023-08-25 DIAGNOSIS — E118 Type 2 diabetes mellitus with unspecified complications: Secondary | ICD-10-CM | POA: Diagnosis not present

## 2023-08-25 DIAGNOSIS — I447 Left bundle-branch block, unspecified: Secondary | ICD-10-CM | POA: Diagnosis not present

## 2023-08-25 DIAGNOSIS — F32A Depression, unspecified: Secondary | ICD-10-CM | POA: Diagnosis not present

## 2023-08-25 DIAGNOSIS — I5032 Chronic diastolic (congestive) heart failure: Secondary | ICD-10-CM | POA: Diagnosis not present

## 2023-08-26 DIAGNOSIS — K219 Gastro-esophageal reflux disease without esophagitis: Secondary | ICD-10-CM | POA: Diagnosis not present

## 2023-08-26 DIAGNOSIS — Z66 Do not resuscitate: Secondary | ICD-10-CM | POA: Diagnosis not present

## 2023-08-26 DIAGNOSIS — E118 Type 2 diabetes mellitus with unspecified complications: Secondary | ICD-10-CM | POA: Diagnosis not present

## 2023-08-26 DIAGNOSIS — I110 Hypertensive heart disease with heart failure: Secondary | ICD-10-CM | POA: Diagnosis not present

## 2023-08-26 DIAGNOSIS — I440 Atrioventricular block, first degree: Secondary | ICD-10-CM | POA: Diagnosis not present

## 2023-08-26 DIAGNOSIS — Z515 Encounter for palliative care: Secondary | ICD-10-CM | POA: Diagnosis not present

## 2023-08-26 DIAGNOSIS — I5032 Chronic diastolic (congestive) heart failure: Secondary | ICD-10-CM | POA: Diagnosis not present

## 2023-08-26 DIAGNOSIS — F32A Depression, unspecified: Secondary | ICD-10-CM | POA: Diagnosis not present

## 2023-08-26 DIAGNOSIS — I447 Left bundle-branch block, unspecified: Secondary | ICD-10-CM | POA: Diagnosis not present

## 2023-08-28 DIAGNOSIS — E118 Type 2 diabetes mellitus with unspecified complications: Secondary | ICD-10-CM | POA: Diagnosis not present

## 2023-08-28 DIAGNOSIS — I447 Left bundle-branch block, unspecified: Secondary | ICD-10-CM | POA: Diagnosis not present

## 2023-08-28 DIAGNOSIS — I5032 Chronic diastolic (congestive) heart failure: Secondary | ICD-10-CM | POA: Diagnosis not present

## 2023-08-28 DIAGNOSIS — I110 Hypertensive heart disease with heart failure: Secondary | ICD-10-CM | POA: Diagnosis not present

## 2023-08-28 DIAGNOSIS — F32A Depression, unspecified: Secondary | ICD-10-CM | POA: Diagnosis not present

## 2023-08-28 DIAGNOSIS — K219 Gastro-esophageal reflux disease without esophagitis: Secondary | ICD-10-CM | POA: Diagnosis not present

## 2023-08-29 DIAGNOSIS — I5032 Chronic diastolic (congestive) heart failure: Secondary | ICD-10-CM | POA: Diagnosis not present

## 2023-08-29 DIAGNOSIS — I447 Left bundle-branch block, unspecified: Secondary | ICD-10-CM | POA: Diagnosis not present

## 2023-08-29 DIAGNOSIS — E118 Type 2 diabetes mellitus with unspecified complications: Secondary | ICD-10-CM | POA: Diagnosis not present

## 2023-08-29 DIAGNOSIS — F32A Depression, unspecified: Secondary | ICD-10-CM | POA: Diagnosis not present

## 2023-08-29 DIAGNOSIS — I110 Hypertensive heart disease with heart failure: Secondary | ICD-10-CM | POA: Diagnosis not present

## 2023-08-29 DIAGNOSIS — K219 Gastro-esophageal reflux disease without esophagitis: Secondary | ICD-10-CM | POA: Diagnosis not present

## 2023-08-30 DIAGNOSIS — I110 Hypertensive heart disease with heart failure: Secondary | ICD-10-CM | POA: Diagnosis not present

## 2023-08-30 DIAGNOSIS — F32A Depression, unspecified: Secondary | ICD-10-CM | POA: Diagnosis not present

## 2023-08-30 DIAGNOSIS — E118 Type 2 diabetes mellitus with unspecified complications: Secondary | ICD-10-CM | POA: Diagnosis not present

## 2023-08-30 DIAGNOSIS — K219 Gastro-esophageal reflux disease without esophagitis: Secondary | ICD-10-CM | POA: Diagnosis not present

## 2023-08-30 DIAGNOSIS — I447 Left bundle-branch block, unspecified: Secondary | ICD-10-CM | POA: Diagnosis not present

## 2023-08-30 DIAGNOSIS — I5032 Chronic diastolic (congestive) heart failure: Secondary | ICD-10-CM | POA: Diagnosis not present

## 2023-08-31 DIAGNOSIS — I5032 Chronic diastolic (congestive) heart failure: Secondary | ICD-10-CM | POA: Diagnosis not present

## 2023-08-31 DIAGNOSIS — E118 Type 2 diabetes mellitus with unspecified complications: Secondary | ICD-10-CM | POA: Diagnosis not present

## 2023-08-31 DIAGNOSIS — I110 Hypertensive heart disease with heart failure: Secondary | ICD-10-CM | POA: Diagnosis not present

## 2023-08-31 DIAGNOSIS — F32A Depression, unspecified: Secondary | ICD-10-CM | POA: Diagnosis not present

## 2023-08-31 DIAGNOSIS — K219 Gastro-esophageal reflux disease without esophagitis: Secondary | ICD-10-CM | POA: Diagnosis not present

## 2023-08-31 DIAGNOSIS — I447 Left bundle-branch block, unspecified: Secondary | ICD-10-CM | POA: Diagnosis not present

## 2023-09-01 DIAGNOSIS — E118 Type 2 diabetes mellitus with unspecified complications: Secondary | ICD-10-CM | POA: Diagnosis not present

## 2023-09-01 DIAGNOSIS — I447 Left bundle-branch block, unspecified: Secondary | ICD-10-CM | POA: Diagnosis not present

## 2023-09-01 DIAGNOSIS — F32A Depression, unspecified: Secondary | ICD-10-CM | POA: Diagnosis not present

## 2023-09-01 DIAGNOSIS — K219 Gastro-esophageal reflux disease without esophagitis: Secondary | ICD-10-CM | POA: Diagnosis not present

## 2023-09-01 DIAGNOSIS — I5032 Chronic diastolic (congestive) heart failure: Secondary | ICD-10-CM | POA: Diagnosis not present

## 2023-09-01 DIAGNOSIS — I110 Hypertensive heart disease with heart failure: Secondary | ICD-10-CM | POA: Diagnosis not present

## 2023-09-05 DIAGNOSIS — I5032 Chronic diastolic (congestive) heart failure: Secondary | ICD-10-CM | POA: Diagnosis not present

## 2023-09-05 DIAGNOSIS — I110 Hypertensive heart disease with heart failure: Secondary | ICD-10-CM | POA: Diagnosis not present

## 2023-09-05 DIAGNOSIS — I447 Left bundle-branch block, unspecified: Secondary | ICD-10-CM | POA: Diagnosis not present

## 2023-09-05 DIAGNOSIS — K219 Gastro-esophageal reflux disease without esophagitis: Secondary | ICD-10-CM | POA: Diagnosis not present

## 2023-09-05 DIAGNOSIS — F32A Depression, unspecified: Secondary | ICD-10-CM | POA: Diagnosis not present

## 2023-09-05 DIAGNOSIS — E118 Type 2 diabetes mellitus with unspecified complications: Secondary | ICD-10-CM | POA: Diagnosis not present

## 2023-09-06 DIAGNOSIS — K219 Gastro-esophageal reflux disease without esophagitis: Secondary | ICD-10-CM | POA: Diagnosis not present

## 2023-09-06 DIAGNOSIS — I110 Hypertensive heart disease with heart failure: Secondary | ICD-10-CM | POA: Diagnosis not present

## 2023-09-06 DIAGNOSIS — F32A Depression, unspecified: Secondary | ICD-10-CM | POA: Diagnosis not present

## 2023-09-06 DIAGNOSIS — E118 Type 2 diabetes mellitus with unspecified complications: Secondary | ICD-10-CM | POA: Diagnosis not present

## 2023-09-06 DIAGNOSIS — I447 Left bundle-branch block, unspecified: Secondary | ICD-10-CM | POA: Diagnosis not present

## 2023-09-06 DIAGNOSIS — I5032 Chronic diastolic (congestive) heart failure: Secondary | ICD-10-CM | POA: Diagnosis not present

## 2023-09-07 DIAGNOSIS — I5032 Chronic diastolic (congestive) heart failure: Secondary | ICD-10-CM | POA: Diagnosis not present

## 2023-09-07 DIAGNOSIS — K219 Gastro-esophageal reflux disease without esophagitis: Secondary | ICD-10-CM | POA: Diagnosis not present

## 2023-09-07 DIAGNOSIS — I447 Left bundle-branch block, unspecified: Secondary | ICD-10-CM | POA: Diagnosis not present

## 2023-09-07 DIAGNOSIS — E118 Type 2 diabetes mellitus with unspecified complications: Secondary | ICD-10-CM | POA: Diagnosis not present

## 2023-09-07 DIAGNOSIS — I110 Hypertensive heart disease with heart failure: Secondary | ICD-10-CM | POA: Diagnosis not present

## 2023-09-07 DIAGNOSIS — F32A Depression, unspecified: Secondary | ICD-10-CM | POA: Diagnosis not present

## 2023-09-08 DIAGNOSIS — I447 Left bundle-branch block, unspecified: Secondary | ICD-10-CM | POA: Diagnosis not present

## 2023-09-08 DIAGNOSIS — I5032 Chronic diastolic (congestive) heart failure: Secondary | ICD-10-CM | POA: Diagnosis not present

## 2023-09-08 DIAGNOSIS — E118 Type 2 diabetes mellitus with unspecified complications: Secondary | ICD-10-CM | POA: Diagnosis not present

## 2023-09-08 DIAGNOSIS — K219 Gastro-esophageal reflux disease without esophagitis: Secondary | ICD-10-CM | POA: Diagnosis not present

## 2023-09-08 DIAGNOSIS — F32A Depression, unspecified: Secondary | ICD-10-CM | POA: Diagnosis not present

## 2023-09-08 DIAGNOSIS — I110 Hypertensive heart disease with heart failure: Secondary | ICD-10-CM | POA: Diagnosis not present

## 2023-09-11 DIAGNOSIS — I447 Left bundle-branch block, unspecified: Secondary | ICD-10-CM | POA: Diagnosis not present

## 2023-09-11 DIAGNOSIS — I5032 Chronic diastolic (congestive) heart failure: Secondary | ICD-10-CM | POA: Diagnosis not present

## 2023-09-11 DIAGNOSIS — K219 Gastro-esophageal reflux disease without esophagitis: Secondary | ICD-10-CM | POA: Diagnosis not present

## 2023-09-11 DIAGNOSIS — F32A Depression, unspecified: Secondary | ICD-10-CM | POA: Diagnosis not present

## 2023-09-11 DIAGNOSIS — I110 Hypertensive heart disease with heart failure: Secondary | ICD-10-CM | POA: Diagnosis not present

## 2023-09-11 DIAGNOSIS — E118 Type 2 diabetes mellitus with unspecified complications: Secondary | ICD-10-CM | POA: Diagnosis not present

## 2023-09-26 DEATH — deceased
